# Patient Record
Sex: Male | Born: 1945 | Race: White | Hispanic: No | Marital: Married | Smoking: Former smoker
Health system: Southern US, Community
[De-identification: ages and names within clinical notes are randomized; demographics above are authoritative.]

## PROBLEM LIST (undated history)

## (undated) DIAGNOSIS — N200 Calculus of kidney: Secondary | ICD-10-CM

## (undated) DIAGNOSIS — M51369 Other intervertebral disc degeneration, lumbar region without mention of lumbar back pain or lower extremity pain: Secondary | ICD-10-CM

## (undated) DIAGNOSIS — C801 Malignant (primary) neoplasm, unspecified: Secondary | ICD-10-CM

## (undated) DIAGNOSIS — E119 Type 2 diabetes mellitus without complications: Secondary | ICD-10-CM

## (undated) DIAGNOSIS — R361 Hematospermia: Secondary | ICD-10-CM

## (undated) DIAGNOSIS — K649 Unspecified hemorrhoids: Secondary | ICD-10-CM

## (undated) DIAGNOSIS — M5136 Other intervertebral disc degeneration, lumbar region: Secondary | ICD-10-CM

## (undated) DIAGNOSIS — G63 Polyneuropathy in diseases classified elsewhere: Secondary | ICD-10-CM

## (undated) DIAGNOSIS — M1990 Unspecified osteoarthritis, unspecified site: Secondary | ICD-10-CM

## (undated) DIAGNOSIS — Z86010 Personal history of colonic polyps: Secondary | ICD-10-CM

## (undated) DIAGNOSIS — N529 Male erectile dysfunction, unspecified: Secondary | ICD-10-CM

## (undated) DIAGNOSIS — E669 Obesity, unspecified: Secondary | ICD-10-CM

## (undated) DIAGNOSIS — N400 Enlarged prostate without lower urinary tract symptoms: Secondary | ICD-10-CM

## (undated) DIAGNOSIS — E785 Hyperlipidemia, unspecified: Secondary | ICD-10-CM

## (undated) DIAGNOSIS — Z87442 Personal history of urinary calculi: Secondary | ICD-10-CM

## (undated) DIAGNOSIS — Z860101 Personal history of adenomatous and serrated colon polyps: Secondary | ICD-10-CM

## (undated) DIAGNOSIS — R7303 Prediabetes: Secondary | ICD-10-CM

## (undated) DIAGNOSIS — I10 Essential (primary) hypertension: Secondary | ICD-10-CM

## (undated) HISTORY — DX: Benign prostatic hyperplasia without lower urinary tract symptoms: N40.0

## (undated) HISTORY — PX: SMALL INTESTINE SURGERY: SHX150

## (undated) HISTORY — PX: HEMORRHOID SURGERY: SHX153

## (undated) HISTORY — DX: Calculus of kidney: N20.0

## (undated) HISTORY — DX: Prediabetes: R73.03

## (undated) HISTORY — DX: Hyperlipidemia, unspecified: E78.5

## (undated) HISTORY — DX: Male erectile dysfunction, unspecified: N52.9

## (undated) HISTORY — DX: Essential (primary) hypertension: I10

## (undated) HISTORY — DX: Hematospermia: R36.1

## (undated) HISTORY — DX: Obesity, unspecified: E66.9

## (undated) HISTORY — DX: Unspecified hemorrhoids: K64.9

## (undated) HISTORY — PX: BACK SURGERY: SHX140

## (undated) HISTORY — DX: Unspecified osteoarthritis, unspecified site: M19.90

---

## 2003-04-10 ENCOUNTER — Other Ambulatory Visit: Payer: Self-pay

## 2007-03-16 ENCOUNTER — Ambulatory Visit: Payer: Self-pay | Admitting: Gastroenterology

## 2009-11-01 ENCOUNTER — Ambulatory Visit: Payer: Self-pay

## 2012-05-09 ENCOUNTER — Ambulatory Visit: Payer: Self-pay | Admitting: Gastroenterology

## 2014-02-05 DIAGNOSIS — E782 Mixed hyperlipidemia: Secondary | ICD-10-CM

## 2014-02-05 DIAGNOSIS — R7303 Prediabetes: Secondary | ICD-10-CM | POA: Insufficient documentation

## 2014-02-05 DIAGNOSIS — E785 Hyperlipidemia, unspecified: Secondary | ICD-10-CM | POA: Insufficient documentation

## 2014-02-05 DIAGNOSIS — E1169 Type 2 diabetes mellitus with other specified complication: Secondary | ICD-10-CM | POA: Insufficient documentation

## 2014-02-05 DIAGNOSIS — E1149 Type 2 diabetes mellitus with other diabetic neurological complication: Secondary | ICD-10-CM | POA: Insufficient documentation

## 2014-08-28 ENCOUNTER — Other Ambulatory Visit: Payer: Self-pay | Admitting: Urology

## 2014-08-28 DIAGNOSIS — N138 Other obstructive and reflux uropathy: Secondary | ICD-10-CM

## 2014-08-28 DIAGNOSIS — N401 Enlarged prostate with lower urinary tract symptoms: Principal | ICD-10-CM

## 2014-08-29 ENCOUNTER — Ambulatory Visit (INDEPENDENT_AMBULATORY_CARE_PROVIDER_SITE_OTHER): Payer: Medicare Other | Admitting: Urology

## 2014-08-29 ENCOUNTER — Encounter: Payer: Self-pay | Admitting: Urology

## 2014-08-29 VITALS — BP 131/74 | HR 91 | Ht 71.0 in | Wt 242.1 lb

## 2014-08-29 DIAGNOSIS — N401 Enlarged prostate with lower urinary tract symptoms: Secondary | ICD-10-CM

## 2014-08-29 DIAGNOSIS — N138 Other obstructive and reflux uropathy: Secondary | ICD-10-CM

## 2014-08-29 DIAGNOSIS — R361 Hematospermia: Secondary | ICD-10-CM

## 2014-08-29 LAB — BLADDER SCAN AMB NON-IMAGING: Scan Result: 30

## 2014-08-29 MED ORDER — TAMSULOSIN HCL 0.4 MG PO CAPS: 0.4000 mg | ORAL_CAPSULE | Freq: Every day | ORAL | Status: DC

## 2014-08-29 NOTE — Progress Notes (Signed)
08/29/2014 9:43 PM   Fred Anderson Feb 26, 1946 [id number]  Referring provider: No referring provider defined for this encounter.  Chief Complaint  Patient presents with  . Hematospermia    3 month follow up patients states health has gone down hill since starting med you gave him    HPI: [name] is a 69 year old white male who presented to [place] 3 months ago for post void dribbling. I started him on finasteride 5 mg daily and instructed him to return to the clinic in 3 months. Patient stated that since he started the finasteride he has been going downhill. He started experiencing extreme fatigue and stopped the finasteride. The fatigue improved, so he restarted the finasteride and the fatigue returned. He did state it helped the post void dribbling, but he cannot tolerate the medication.  His IPSS score 3 months ago was 2/0 (mild).  His IPSS score today is 1/1(mild).  He is still experiencing points void dribbling which is very bothersome to him. His PSA was 3.6 ng/mL on 05/22/2014.      IPSS      08/29/14 1000       International Prostate Symptom Score   How often have you had the sensation of not emptying your bladder? Not at All     How often have you had to urinate less than every two hours? Not at All     How often have you found you stopped and started again several times when you urinated? Not at All     How often have you found it difficult to postpone urination? Not at All     How often have you had a weak urinary stream? Less than 1 in 5 times     How often have you had to strain to start urination? Not at All     How many times did you typically get up at night to urinate? None     Total IPSS Score 1     Quality of Life due to urinary symptoms   If you were to spend the rest of your life with your urinary condition just the way it is now how would you feel about that? Pleased        Patient does not report any episodes of hematospermia over the last 3  months.    PMH: Past Medical History  Diagnosis Date  . Prediabetes   . Hyperlipidemia   . Hemorrhoids   . Blood in semen   . Kidney stones   . Arthritis   . Hypertension   . Obesity   . Hematospermia   . Erectile dysfunction   . BPH (benign prostatic hypertrophy)     Surgical History: No past surgical history on file.  Home Medications:    Medication List       This list is accurate as of: 08/29/14 11:59 PM.  Always use your most recent med list.               aspirin 325 MG tablet  Take 325 mg by mouth daily.     fenofibrate 160 MG tablet  Take 160 mg by mouth daily.     multivitamin tablet  Take 1 tablet by mouth daily.     naproxen 500 MG tablet  Commonly known as:  NAPROSYN  Take 500 mg by mouth 2 (two) times daily with a meal.     niacin 500 MG tablet  Take 500 mg by mouth at bedtime.  OMEGA 3 PO  Take by mouth.     tamsulosin 0.4 MG Caps capsule  Commonly known as:  FLOMAX  Take 1 capsule (0.4 mg total) by mouth daily.        Allergies: No Known Allergies  Family History: No family history on file.  Social History:  reports that he quit smoking about 26 years ago. He does not have any smokeless tobacco history on file. He reports that he drinks about 7.2 oz of alcohol per week. He reports that he does not use illicit drugs.  ROS: Urological Symptom Review  Patient is experiencing the following symptoms: Leakage of urine Erection problems (male only)   Review of Systems  Gastrointestinal (upper)  : Negative for upper GI symptoms  Gastrointestinal (lower) : Negative for lower GI symptoms  Constitutional : Negative for symptoms  Skin: Negative for skin symptoms  Eyes: Negative for eye symptoms  Ear/Nose/Throat : Negative for Ear/Nose/Throat symptoms  Hematologic/Lymphatic: Negative for Hematologic/Lymphatic symptoms  Cardiovascular : Negative for cardiovascular symptoms  Respiratory : Negative for respiratory  symptoms  Endocrine: Negative for endocrine symptoms  Musculoskeletal: Negative for musculoskeletal symptoms  Neurological: Negative for neurological symptoms  Psychologic: Negative for psychiatric symptoms   Physical Exam: BP 131/74 mmHg  Pulse 91  Ht '5\' 11"'$  (1.803 m)  Wt 242 lb 1.6 oz (109.816 kg)  BMI 33.78 kg/m2  Results for orders placed or performed in visit on 08/29/14  BLADDER SCAN AMB NON-IMAGING  Result Value Ref Range   Scan Result 30    Laboratory Data: No results found for: WBC, HGB, HCT, MCV, PLT  No results found for: CREATININE  No results found for: PSA  No results found for: TESTOSTERONE  No results found for: HGBA1C  Urinalysis No results found for: COLORURINE, APPEARANCEUR, LABSPEC, PHURINE, GLUCOSEU, HGBUR, BILIRUBINUR, Derrill Memo, UROBILINOGEN, NITRITE, LEUKOCYTESUR  Pertinent Imaging: Results for FRANKI, STEMEN (MRN [id number]) as of 09/01/2014 23:22  Ref. Range 08/29/2014 09:48  Scan Result Unknown 30   Assessment & Plan:    1. BPH with obstruction/lower urinary tract symptoms:  Patient was started on tamsulosin 0.4 mg 1 capsule daily 30 minutes after a meal.   I advised him of the side effects, such as: retrograde ejaculation, sinus congestion, nasal congestion, rhinorrhea, rhinitis, dizziness, and seasonal allergic rhinitis.  He will follow-up in 3 months time for IPSS score and symptom recheck. PSA History:    3.6 ng/mL on 05/22/2014.     - BLADDER SCAN AMB NON-IMAGING  2. Hematospermia:  Patient is advised he may experience in his semen now that he has discontinued the finasteride. He also may not see any semen due to the side effects of the tamsulosin.  He is reassured that this is not a significant finding.  No Follow-up on file.  Zara Council, [facility] [address], [place], [place] [address] [phone]

## 2014-09-02 ENCOUNTER — Encounter: Payer: Self-pay | Admitting: Urology

## 2014-09-02 DIAGNOSIS — N138 Other obstructive and reflux uropathy: Secondary | ICD-10-CM | POA: Insufficient documentation

## 2014-09-02 DIAGNOSIS — N401 Enlarged prostate with lower urinary tract symptoms: Principal | ICD-10-CM

## 2014-09-02 DIAGNOSIS — R361 Hematospermia: Secondary | ICD-10-CM | POA: Insufficient documentation

## 2014-10-16 ENCOUNTER — Emergency Department (HOSPITAL_COMMUNITY): Payer: Medicare Other

## 2014-10-16 ENCOUNTER — Encounter (HOSPITAL_COMMUNITY): Payer: Self-pay | Admitting: Neurology

## 2014-10-16 ENCOUNTER — Emergency Department (HOSPITAL_COMMUNITY)
Admission: EM | Admit: 2014-10-16 | Discharge: 2014-10-16 | Disposition: A | Discharge status: Home / Self Care | Payer: Medicare Other | Attending: Emergency Medicine | Admitting: Emergency Medicine

## 2014-10-16 DIAGNOSIS — Z8739 Personal history of other diseases of the musculoskeletal system and connective tissue: Secondary | ICD-10-CM | POA: Diagnosis not present

## 2014-10-16 DIAGNOSIS — I10 Essential (primary) hypertension: Secondary | ICD-10-CM | POA: Diagnosis not present

## 2014-10-16 DIAGNOSIS — T148XXA Other injury of unspecified body region, initial encounter: Secondary | ICD-10-CM

## 2014-10-16 DIAGNOSIS — Z23 Encounter for immunization: Secondary | ICD-10-CM | POA: Diagnosis not present

## 2014-10-16 DIAGNOSIS — E669 Obesity, unspecified: Secondary | ICD-10-CM | POA: Diagnosis not present

## 2014-10-16 DIAGNOSIS — T148 Other injury of unspecified body region: Secondary | ICD-10-CM | POA: Insufficient documentation

## 2014-10-16 DIAGNOSIS — Z87442 Personal history of urinary calculi: Secondary | ICD-10-CM | POA: Diagnosis not present

## 2014-10-16 DIAGNOSIS — T1490XA Injury, unspecified, initial encounter: Secondary | ICD-10-CM

## 2014-10-16 DIAGNOSIS — Y9389 Activity, other specified: Secondary | ICD-10-CM | POA: Diagnosis not present

## 2014-10-16 DIAGNOSIS — Y92410 Unspecified street and highway as the place of occurrence of the external cause: Secondary | ICD-10-CM | POA: Insufficient documentation

## 2014-10-16 DIAGNOSIS — Z87891 Personal history of nicotine dependence: Secondary | ICD-10-CM | POA: Insufficient documentation

## 2014-10-16 DIAGNOSIS — Y998 Other external cause status: Secondary | ICD-10-CM | POA: Diagnosis not present

## 2014-10-16 DIAGNOSIS — T07XXXA Unspecified multiple injuries, initial encounter: Secondary | ICD-10-CM

## 2014-10-16 LAB — SAMPLE TO BLOOD BANK

## 2014-10-16 LAB — CBC
HCT: 48.2 % (ref 39.0–52.0)
HEMOGLOBIN: 16.6 g/dL (ref 13.0–17.0)
MCH: 31.8 pg (ref 26.0–34.0)
MCHC: 34.4 g/dL (ref 30.0–36.0)
MCV: 92.3 fL (ref 78.0–100.0)
PLATELETS: 166 10*3/uL (ref 150–400)
RBC: 5.22 MIL/uL (ref 4.22–5.81)
RDW: 14.2 % (ref 11.5–15.5)
WBC: 5.6 10*3/uL (ref 4.0–10.5)

## 2014-10-16 LAB — I-STAT CHEM 8, ED
BUN: 22 mg/dL — ABNORMAL HIGH (ref 6–20)
CALCIUM ION: 1.31 mmol/L — AB (ref 1.13–1.30)
CHLORIDE: 108 mmol/L (ref 101–111)
CREATININE: 1 mg/dL (ref 0.61–1.24)
GLUCOSE: 110 mg/dL — AB (ref 65–99)
HCT: 52 % (ref 39.0–52.0)
Hemoglobin: 17.7 g/dL — ABNORMAL HIGH (ref 13.0–17.0)
Potassium: 4.2 mmol/L (ref 3.5–5.1)
Sodium: 142 mmol/L (ref 135–145)
TCO2: 21 mmol/L (ref 0–100)

## 2014-10-16 LAB — COMPREHENSIVE METABOLIC PANEL
ALBUMIN: 4.2 g/dL (ref 3.5–5.0)
ALK PHOS: 38 U/L (ref 38–126)
ALT: 17 U/L (ref 17–63)
ANION GAP: 11 (ref 5–15)
AST: 24 U/L (ref 15–41)
BUN: 19 mg/dL (ref 6–20)
CALCIUM: 10.3 mg/dL (ref 8.9–10.3)
CHLORIDE: 108 mmol/L (ref 101–111)
CO2: 20 mmol/L — AB (ref 22–32)
Creatinine, Ser: 1.03 mg/dL (ref 0.61–1.24)
GFR calc non Af Amer: >60 mL/min (ref >60)
Glucose, Bld: 105 mg/dL — ABNORMAL HIGH (ref 65–99)
Potassium: 4.2 mmol/L (ref 3.5–5.1)
SODIUM: 139 mmol/L (ref 135–145)
Total Bilirubin: 0.8 mg/dL (ref 0.3–1.2)
Total Protein: 6.9 g/dL (ref 6.5–8.1)

## 2014-10-16 LAB — PROTIME-INR
INR: 1.03 (ref 0.00–1.49)
PROTHROMBIN TIME: 13.7 s (ref 11.6–15.2)
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
## 2014-10-16 MED ORDER — HYDROCODONE-ACETAMINOPHEN 5-325 MG PO TABS
1.0000 | ORAL_TABLET | Freq: Once | ORAL | Status: AC
  Administered 2014-10-16: 1 via ORAL
  Filled 2014-10-16: qty 1

## 2014-10-16 MED ORDER — TETANUS-DIPHTH-ACELL PERTUSSIS 5-2.5-18.5 LF-MCG/0.5 IM SUSP
0.5000 mL | Freq: Once | INTRAMUSCULAR | Status: AC
  Administered 2014-10-16: 0.5 mL via INTRAMUSCULAR
  Filled 2014-10-16: qty 0.5

## 2014-10-16 MED ORDER — OXYCODONE-ACETAMINOPHEN 5-325 MG PO TABS: 1.0000 | ORAL_TABLET | Freq: Four times a day (QID) | ORAL | Status: DC | PRN

## 2014-10-16 MED ORDER — IOHEXOL 300 MG/ML  SOLN
100.0000 mL | Freq: Once | INTRAMUSCULAR | Status: AC | PRN
  Administered 2014-10-16: 100 mL via INTRAVENOUS

## 2014-10-16 NOTE — ED Notes (Signed)
Patient transported to X-ray 
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
## 2014-10-16 NOTE — Discharge Instructions (Signed)
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
## 2014-10-17 LAB — CDS SEROLOGY

## 2014-11-29 ENCOUNTER — Ambulatory Visit (INDEPENDENT_AMBULATORY_CARE_PROVIDER_SITE_OTHER): Payer: Medicare Other | Admitting: Urology

## 2014-11-29 ENCOUNTER — Encounter: Payer: Self-pay | Admitting: Urology

## 2014-11-29 VITALS — BP 133/75 | HR 70 | Ht 71.0 in | Wt 249.5 lb

## 2014-11-29 DIAGNOSIS — N529 Male erectile dysfunction, unspecified: Secondary | ICD-10-CM

## 2014-11-29 DIAGNOSIS — N528 Other male erectile dysfunction: Secondary | ICD-10-CM

## 2014-11-29 DIAGNOSIS — N138 Other obstructive and reflux uropathy: Secondary | ICD-10-CM

## 2014-11-29 DIAGNOSIS — N401 Enlarged prostate with lower urinary tract symptoms: Secondary | ICD-10-CM | POA: Diagnosis not present

## 2014-11-29 NOTE — Progress Notes (Signed)
11/29/2014 9:05 AM   Fred Anderson 10/15/1945 [id number]  Referring provider: [name], MD [place], [place] [address]  Chief Complaint  Patient presents with  . Benign Prostatic Hypertrophy    3 month follow up    HPI: Patient is a 69 year old white male who was placed on finasteride for his symptoms of hematospermia and BPH and LUTS and found the side effects intolerable. He was then changed to tamsulosin 0.4 mg daily and he presents today to discuss his response.  His IPSS score today is 5 , which is mild lower urinary tract symptomatology. He is mostly satisfied with his quality life due to his urinary symptoms.   His major complaint today nocturia that started just last week, but he attributes this to an increase in his water intake and attempt to lose weight.  He denies any dysuria, hematuria or suprapubic pain.   He also denies any recent fevers, chills, nausea or vomiting.  He does not have a family history of PCa.      IPSS      11/29/14 0800       International Prostate Symptom Score   How often have you had the sensation of not emptying your bladder? Less than 1 in 5     How often have you had to urinate less than every two hours? Not at All     How often have you found you stopped and started again several times when you urinated? Not at All     How often have you found it difficult to postpone urination? Not at All     How often have you had a weak urinary stream? About half the time     How often have you had to strain to start urination? Not at All     How many times did you typically get up at night to urinate? 1 Time     Total IPSS Score 5     Quality of Life due to urinary symptoms   If you were to spend the rest of your life with your urinary condition just the way it is now how would you feel about that? Mostly Satisfied        Score:  1-7 Mild 8-19 Moderate 20-35 Severe     PMH: Past Medical History  Diagnosis Date  .  Prediabetes   . Hyperlipidemia   . Hemorrhoids   . Blood in semen   . Kidney stones   . Arthritis   . Hypertension   . Obesity   . Hematospermia   . Erectile dysfunction   . BPH (benign prostatic hypertrophy)     Surgical History: Past Surgical History  Procedure Laterality Date  . Small intestine surgery    . Back surgery  age 69    Broken back    Home Medications:    Medication List       This list is accurate as of: 11/29/14  9:05 AM.  Always use your most recent med list.               aspirin 325 MG tablet  Take 325 mg by mouth daily.     fenofibrate 160 MG tablet  Take 160 mg by mouth daily.     multivitamin tablet  Take 1 tablet by mouth daily.     naproxen 500 MG tablet  Commonly known as:  NAPROSYN  Take 500 mg by mouth 2 (two) times daily with a  meal.     niacin 500 MG tablet  Take 500 mg by mouth at bedtime.     OMEGA 3 PO  Take by mouth.     oxyCODONE-acetaminophen 5-325 MG per tablet  Commonly known as:  PERCOCET/ROXICET  Take 1 tablet by mouth every 6 (six) hours as needed for severe pain.     tamsulosin 0.4 MG Caps capsule  Commonly known as:  FLOMAX  Take 1 capsule (0.4 mg total) by mouth daily.        Allergies: No Known Allergies  Family History: Family History  Problem Relation Age of Onset  . Colon cancer Father   . Cancer Mother   . Kidney disease Neg Hx   . Prostate cancer Neg Hx     Social History:  reports that he quit smoking about 26 years ago. He does not have any smokeless tobacco history on file. He reports that he drinks about 7.2 oz of alcohol per week. He reports that he does not use illicit drugs.  ROS: UROLOGY Frequent Urination?: No Hard to postpone urination?: No Burning/pain with urination?: No Get up at night to urinate?: No Leakage of urine?: Yes Urine stream starts and stops?: No Trouble starting stream?: No Do you have to strain to urinate?: No Blood in urine?: No Urinary tract infection?:  No Sexually transmitted disease?: No Injury to kidneys or bladder?: No Painful intercourse?: No Weak stream?: Yes Erection problems?: No Penile pain?: No  Gastrointestinal Nausea?: No Vomiting?: No Indigestion/heartburn?: No Diarrhea?: No Constipation?: No  Constitutional Fever: No Night sweats?: No Weight loss?: No Fatigue?: No  Skin Skin rash/lesions?: No Itching?: No  Eyes Blurred vision?: No Double vision?: No  Ears/Nose/Throat Sore throat?: No Sinus problems?: No  Hematologic/Lymphatic Swollen glands?: No Easy bruising?: No  Cardiovascular Leg swelling?: No Chest pain?: No  Respiratory Cough?: No Shortness of breath?: No  Endocrine Excessive thirst?: No  Musculoskeletal Back pain?: No Joint pain?: No  Neurological Headaches?: No Dizziness?: No  Psychologic Depression?: No Anxiety?: No  Physical Exam: BP 133/75 mmHg  Pulse 70  Ht '5\' 11"'$  (1.803 m)  Wt 249 lb 8 oz (113.172 kg)  BMI 34.81 kg/m2   Laboratory Data: Lab Results  Component Value Date   WBC 5.6 10/16/2014   HGB 17.7* 10/16/2014   HCT 52.0 10/16/2014   MCV 92.3 10/16/2014   PLT 166 10/16/2014    Lab Results  Component Value Date   CREATININE 1.00 10/16/2014   PSA History:  3.6 ng/mL on 05/22/2014.   Assessment & Plan:    1. BPH with LUTS:   IPSS is 5/2.  He is finding that the tamsulosin is effective for his LUTS.  He will continue the medication.  He will RTC in one year for PSA, DRE and IPSS score.    2. Erectile dysfunction:   We discussed trying a different PDE5 inhibitor, intra-urethral suppositories, intracavernous vasoactive drug injection therapy, vacuum constriction device and penile prosthesis implantation.   He was not interested in any of these treatment modalities. He also asked about testosterone therapy. I stated to the patient that testosterone therapy is not indicated for erectile dysfunction treatment.  He is not having other symptoms of  hypogonadism at this time so screening is not warranted.  I also explained to him the side effects of testosterone therapy and because of that we did not use it to treat solely erectile dysfunction.    There are no diagnoses linked to this encounter.  Return in  about 1 year (around 11/29/2015) for IPSS.  Zara Council, [facility] [address] [place], [place] [address] [phone]

## 2015-09-05 DIAGNOSIS — E1142 Type 2 diabetes mellitus with diabetic polyneuropathy: Secondary | ICD-10-CM | POA: Insufficient documentation

## 2015-09-27 ENCOUNTER — Other Ambulatory Visit: Payer: Self-pay

## 2015-09-27 DIAGNOSIS — N400 Enlarged prostate without lower urinary tract symptoms: Secondary | ICD-10-CM

## 2015-09-27 MED ORDER — TAMSULOSIN HCL 0.4 MG PO CAPS: 0.4000 mg | ORAL_CAPSULE | Freq: Every day | ORAL | Status: DC

## 2015-12-02 ENCOUNTER — Encounter: Payer: Self-pay | Admitting: Urology

## 2015-12-02 ENCOUNTER — Ambulatory Visit (INDEPENDENT_AMBULATORY_CARE_PROVIDER_SITE_OTHER): Payer: Medicare Other | Admitting: Urology

## 2015-12-02 VITALS — BP 128/80 | HR 72 | Ht 71.0 in | Wt 255.4 lb

## 2015-12-02 DIAGNOSIS — N528 Other male erectile dysfunction: Secondary | ICD-10-CM

## 2015-12-02 DIAGNOSIS — N138 Other obstructive and reflux uropathy: Secondary | ICD-10-CM

## 2015-12-02 DIAGNOSIS — N401 Enlarged prostate with lower urinary tract symptoms: Secondary | ICD-10-CM | POA: Diagnosis not present

## 2015-12-02 DIAGNOSIS — N529 Male erectile dysfunction, unspecified: Secondary | ICD-10-CM

## 2015-12-02 NOTE — Progress Notes (Signed)
12/02/2015 8:59 AM   Fred Anderson 1945-11-22 [id number]  Referring provider: [name], MD [name], [place] [address]  Chief Complaint  Patient presents with  . Benign Prostatic Hypertrophy    1 Year follow up   . Erectile Dysfunction    HPI: Patient is a 70 year old Caucasian male who presents today for a one year follow up for ED and BPH with LUTS.    BPH WITH LUTS His IPSS score today is 7, which is mild lower urinary tract symptomatology. He is delighted with his quality life due to his urinary symptoms.  His previous IPSS score was 5/2.  His previous PVR is 30 mL.  His major complaint today is frequency of urination at times and a weak urinary stream in the morning.  He has had these symptoms for several years.  He denies any dysuria, hematuria or suprapubic pain.   He currently taking tamsulosin 0.4 mg daily.  He also denies any recent fevers, chills, nausea or vomiting.  He does not have a family history of PCa.      IPSS    Row Name 12/02/15 0800         International Prostate Symptom Score   How often have you had the sensation of not emptying your bladder? Less than 1 in 5     How often have you had to urinate less than every two hours? Less than half the time     How often have you found you stopped and started again several times when you urinated? Not at All     How often have you found it difficult to postpone urination? Less than half the time     How often have you had a weak urinary stream? Less than 1 in 5 times     How often have you had to strain to start urination? Not at All     How many times did you typically get up at night to urinate? 1 Time     Total IPSS Score 7       Quality of Life due to urinary symptoms   If you were to spend the rest of your life with your urinary condition just the way it is now how would you feel about that? Delighted        Score:  1-7 Mild 8-19 Moderate 20-35 Severe  Erectile  dysfunction His SHIM score is 10, which is moderate ED.   He has been having difficulty with erections for several years.   His major complaint is lack of firmness in his erections, so they not satisfactory for intercourse.  His libido is preserved.   His risk factors for ED are age, BPH, DM, HTN, HLD, sleep apnea, alcohol abuse, pain medication and blood pressure medications.  He denies any painful erections or curvatures with his erections.   He has tried PDE5-inhibitors in the past and they were not effective.       SHIM    Row Name 12/02/15 0836         SHIM: Over the last 6 months:   How do you rate your confidence that you could get and keep an erection? Very Low     When you had erections with sexual stimulation, how often were your erections hard enough for penetration (entering your partner)? A Few Times (much less than half the time)     During sexual intercourse, how often were you able to maintain your erection  after you had penetrated (entered) your partner? Very Difficult     During sexual intercourse, how difficult was it to maintain your erection to completion of intercourse? Difficult     When you attempted sexual intercourse, how often was it satisfactory for you? Very Difficult       SHIM Total Score   SHIM 10        Score: 1-7 Severe ED 8-11 Moderate ED 12-16 Mild-Moderate ED 17-21 Mild ED 22-25 No ED  PMH: Past Medical History:  Diagnosis Date  . Arthritis   . Blood in semen   . BPH (benign prostatic hypertrophy)   . Erectile dysfunction   . Hematospermia   . Hemorrhoids   . Hyperlipidemia   . Hypertension   . Kidney stones   . Obesity   . Prediabetes     Surgical History: Past Surgical History:  Procedure Laterality Date  . BACK SURGERY  age 72   Broken back  . SMALL INTESTINE SURGERY      Home Medications:    Medication List       Accurate as of 12/02/15  8:59 AM. Always use your most recent med list.          aspirin 325 MG  tablet Take 325 mg by mouth daily.   fenofibrate 160 MG tablet Take 160 mg by mouth daily.   finasteride 5 MG tablet Commonly known as:  PROSCAR Take by mouth.   gabapentin 300 MG capsule Commonly known as:  NEURONTIN Take by mouth.   lovastatin 20 MG tablet Commonly known as:  MEVACOR TAKE 1 TABLET (20 MG TOTAL) BY MOUTH DAILY WITH DINNER.   MULTIVITAMIN ADULT EXTRA C PO Take by mouth.   multivitamin tablet Take 1 tablet by mouth daily.   naproxen 500 MG tablet Commonly known as:  NAPROSYN Take 500 mg by mouth 2 (two) times daily with a meal.   niacin 500 MG tablet Take 500 mg by mouth at bedtime.   OMEGA 3 PO Take by mouth.   oxyCODONE-acetaminophen 5-325 MG tablet Commonly known as:  PERCOCET/ROXICET Take 1 tablet by mouth every 6 (six) hours as needed for severe pain.   tamsulosin 0.4 MG Caps capsule Commonly known as:  FLOMAX Take 1 capsule (0.4 mg total) by mouth daily.       Allergies: No Known Allergies  Family History: Family History  Problem Relation Age of Onset  . Colon cancer Father   . Cancer Mother   . Kidney disease Neg Hx   . Prostate cancer Neg Hx     Social History:  reports that he quit smoking about 27 years ago. He has never used smokeless tobacco. He reports that he drinks about 7.2 oz of alcohol per week . He reports that he does not use drugs.  ROS: UROLOGY Frequent Urination?: Yes Hard to postpone urination?: No Burning/pain with urination?: No Get up at night to urinate?: No Leakage of urine?: No Urine stream starts and stops?: No Trouble starting stream?: No Do you have to strain to urinate?: No Blood in urine?: No Urinary tract infection?: No Sexually transmitted disease?: No Injury to kidneys or bladder?: No Painful intercourse?: No Weak stream?: Yes Erection problems?: No Penile pain?: No  Gastrointestinal Nausea?: No Vomiting?: No Indigestion/heartburn?: No Diarrhea?: No Constipation?:  No  Constitutional Fever: No Night sweats?: Yes Weight loss?: No Fatigue?: No  Skin Skin rash/lesions?: No Itching?: No  Eyes Blurred vision?: No Double vision?: No  Ears/Nose/Throat Sore throat?:  No Sinus problems?: No  Hematologic/Lymphatic Swollen glands?: No Easy bruising?: No  Cardiovascular Leg swelling?: No Chest pain?: No  Respiratory Cough?: No Shortness of breath?: No  Endocrine Excessive thirst?: No  Musculoskeletal Back pain?: No Joint pain?: No  Neurological Headaches?: No Dizziness?: No  Psychologic Depression?: No Anxiety?: No  Physical Exam: BP 128/80   Pulse 72   Ht '5\' 11"'$  (1.803 m)   Wt 255 lb 6.4 oz (115.8 kg)   BMI 35.62 kg/m   Constitutional: Well nourished. Alert and oriented, No acute distress. HEENT: [place] AT, moist mucus membranes. Trachea midline, no masses. Cardiovascular: No clubbing, cyanosis, or edema. Respiratory: Normal respiratory effort, no increased work of breathing. GI: Abdomen is soft, non tender, non distended, no abdominal masses. Liver and spleen not palpable.  No hernias appreciated.  Stool sample for occult testing is not indicated.   GU: No CVA tenderness.  No bladder fullness or masses.  Patient with uncircumcised phallus. Foreskin easily retracted  Urethral meatus is patent.  No penile discharge. No penile lesions or rashes. Scrotum without lesions, cysts, rashes and/or edema.  Testicles are located scrotally bilaterally. No masses are appreciated in the testicles. Left and right epididymis are normal. Rectal: Patient with  normal sphincter tone. Anus and perineum without scarring or rashes. No rectal masses are appreciated. Prostate is approximately 55 grams, no nodules are appreciated. Seminal vesicles are normal. Skin: No rashes, bruises or suspicious lesions. Lymph: No cervical or inguinal adenopathy. Neurologic: Grossly intact, no focal deficits, moving all 4 extremities. Psychiatric: Normal mood and  affect.  Laboratory Data: Lab Results  Component Value Date   WBC 5.6 10/16/2014   HGB 17.7 (H) 10/16/2014   HCT 52.0 10/16/2014   MCV 92.3 10/16/2014   PLT 166 10/16/2014    Lab Results  Component Value Date   CREATININE 1.00 10/16/2014   PSA History:  3.6 ng/mL on 05/22/2014   Assessment & Plan:    1. BPH with LUTS  - IPSS score is 7/0, it is stable  - Continue conservative management, avoiding bladder irritants and timed voiding's  - Continue tamsulosin 0.4 mg daily, refill not needed today  - Was on finasteride but could not tolerate the side effects  - RTC in 12 months for IPSS, PSA and exam    2. Erectile dysfunction:   - SHIM score 10  - failed PDE5-inhibitors  - discussed Trimix injections, might be interested due to his brother having success with the injection, would like to discuss with wife before pursuing  - encouraged the patient to have a sleep study, as this can be a risk factor for ED, he will speak with his PCP  Return in about 1 year (around 12/01/2016) for IPSS, SHIM, PSA and exam.  Zara Council, [facility]  [facility] [address] [place], [place] [address] [phone]

## 2015-12-03 LAB — PSA: Prostate Specific Ag, Serum: 2.6 ng/mL (ref 0.0–4.0)

## 2015-12-04 ENCOUNTER — Telehealth: Payer: Self-pay

## 2015-12-04 NOTE — Telephone Encounter (Signed)
LMOM

## 2015-12-04 NOTE — Telephone Encounter (Signed)
-----   Message from Nori Riis, PA-C sent at 12/03/2015  8:55 PM EDT ----- PSA is good.  We will see him in one year.

## 2015-12-05 NOTE — Telephone Encounter (Signed)
Spoke with pt in reference to PSA results. Pt voiced understanding.  

## 2016-09-14 DIAGNOSIS — Z79899 Other long term (current) drug therapy: Secondary | ICD-10-CM | POA: Insufficient documentation

## 2016-09-18 DIAGNOSIS — Z86010 Personal history of colonic polyps: Secondary | ICD-10-CM | POA: Insufficient documentation

## 2016-10-18 ENCOUNTER — Other Ambulatory Visit: Payer: Self-pay | Admitting: Urology

## 2016-10-18 DIAGNOSIS — N400 Enlarged prostate without lower urinary tract symptoms: Secondary | ICD-10-CM

## 2016-11-22 ENCOUNTER — Other Ambulatory Visit: Payer: Self-pay | Admitting: Urology

## 2016-11-22 DIAGNOSIS — N400 Enlarged prostate without lower urinary tract symptoms: Secondary | ICD-10-CM

## 2016-11-24 ENCOUNTER — Other Ambulatory Visit: Payer: Medicare Other

## 2016-11-24 ENCOUNTER — Encounter: Payer: Self-pay | Admitting: Urology

## 2016-11-30 NOTE — Progress Notes (Signed)
12/01/2016 8:49 AM   Fred Anderson 09-27-1945 [id number]  Referring provider: [name], MD No address on file  Chief Complaint  Patient presents with  . Benign Prostatic Hypertrophy    1 year follow up  . Erectile Dysfunction    HPI: Patient is a 71 year old Caucasian male who presents today for a one year follow up for ED and BPH with LUTS.    BPH WITH LUTS His IPSS score today is 6, which is mild lower urinary tract symptomatology. He is mostly satisfied with his quality life due to his urinary symptoms.  His previous IPSS score was 7/0.  His previous PVR is 30 mL.  His major complaint today is nocturia.   He has had these symptoms for several years.  He denies any dysuria, hematuria or suprapubic pain.   He currently taking tamsulosin 0.4 mg daily.  He also denies any recent fevers, chills, nausea or vomiting.  He does not have a family history of PCa.      IPSS    Row Name 12/01/16 0800         International Prostate Symptom Score   How often have you had the sensation of not emptying your bladder? Less than 1 in 5     How often have you had to urinate less than every two hours? Less than 1 in 5 times     How often have you found you stopped and started again several times when you urinated? Less than 1 in 5 times     How often have you found it difficult to postpone urination? Less than 1 in 5 times     How often have you had a weak urinary stream? Less than 1 in 5 times     How often have you had to strain to start urination? Not at All     How many times did you typically get up at night to urinate? 1 Time     Total IPSS Score 6       Quality of Life due to urinary symptoms   If you were to spend the rest of your life with your urinary condition just the way it is now how would you feel about that? Mostly Satisfied        Score:  1-7 Mild 8-19 Moderate 20-35 Severe  Erectile dysfunction His SHIM score is 13, which is mild to moderate ED.   He has  been having difficulty with erections for several years.   His previous SHIM score was 10.  His major complaint is lack of firmness in his erections, so they not satisfactory for intercourse.  His libido is preserved.   His risk factors for ED are age, BPH, DM, HTN, HLD, sleep apnea, alcohol abuse, pain medication and blood pressure medications.  He denies any painful erections or curvatures with his erections.   He has tried PDE5-inhibitors in the past and they were not effective.       [name] Name 12/01/16 [phone]         SHIM: Over the last 6 months:   How do you rate your confidence that you could get and keep an erection? Very Low     When you had erections with sexual stimulation, how often were your erections hard enough for penetration (entering your partner)? Sometimes (about half the time)     During sexual intercourse, how often were you able to maintain your erection after you had  penetrated (entered) your partner? Most Times (much more than half the time)     During sexual intercourse, how difficult was it to maintain your erection to completion of intercourse? Slightly Difficult     When you attempted sexual intercourse, how often was it satisfactory for you? Almost Never or Never       SHIM Total Score   SHIM 13        Score: 1-7 Severe ED 8-11 Moderate ED 12-16 Mild-Moderate ED 17-21 Mild ED 22-25 No ED  PMH: Past Medical History:  Diagnosis Date  . Arthritis   . Blood in semen   . BPH (benign prostatic hypertrophy)   . Erectile dysfunction   . Hematospermia   . Hemorrhoids   . Hyperlipidemia   . Hypertension   . Kidney stones   . Obesity   . Prediabetes     Surgical History: Past Surgical History:  Procedure Laterality Date  . BACK SURGERY  age 11   Broken back  . SMALL INTESTINE SURGERY      Home Medications:  Allergies as of 12/01/2016      Reactions   Atorvastatin Other (See Comments)      Medication List       Accurate as of 12/01/16   8:49 AM. Always use your most recent med list.          aspirin 325 MG tablet Take 325 mg by mouth daily.   fenofibrate 160 MG tablet Take 160 mg by mouth daily.   finasteride 5 MG tablet Commonly known as:  PROSCAR Take by mouth.   gabapentin 300 MG capsule Commonly known as:  NEURONTIN Take by mouth.   gabapentin 300 MG capsule Commonly known as:  NEURONTIN Take by mouth.   lovastatin 20 MG tablet Commonly known as:  MEVACOR TAKE 1 TABLET (20 MG TOTAL) BY MOUTH DAILY WITH DINNER.   metFORMIN 500 MG tablet Commonly known as:  GLUCOPHAGE Take by mouth.   MULTIVITAMIN ADULT EXTRA C PO Take by mouth.   multivitamin tablet Take 1 tablet by mouth daily.   MULTIVITAMIN/IRON PO Take by mouth.   naproxen 500 MG tablet Commonly known as:  NAPROSYN Take 500 mg by mouth 2 (two) times daily with a meal.   naproxen sodium 220 MG tablet Commonly known as:  ANAPROX Take by mouth.   niacin 500 MG tablet Take 500 mg by mouth at bedtime.   OMEGA 3 PO Take by mouth.   Fish Oil 500 MG Caps Take by mouth.   oxyCODONE-acetaminophen 5-325 MG tablet Commonly known as:  PERCOCET/ROXICET Take 1 tablet by mouth every 6 (six) hours as needed for severe pain.   tamsulosin 0.4 MG Caps capsule Commonly known as:  FLOMAX Take 1 capsule (0.4 mg total) by mouth daily.            Discharge Care Instructions        Start     Ordered   12/01/16 0000  tamsulosin (FLOMAX) 0.4 MG CAPS capsule  Daily    Question:  Supervising Provider  Answer:  Hollice Espy   12/01/16 0842   11/24/16 0000  PSA     11/24/16 1287      Allergies:  Allergies  Allergen Reactions  . Atorvastatin Other (See Comments)    Family History: Family History  Problem Relation Age of Onset  . Colon cancer Father   . Cancer Mother   . Liver cancer Sister   . Kidney cancer Brother   .  Bone cancer Brother   . Kidney disease Neg Hx   . Prostate cancer Neg Hx   . Bladder Cancer Neg Hx      Social History:  reports that he quit smoking about 28 years ago. He has never used smokeless tobacco. He reports that he drinks about 7.2 oz of alcohol per week . He reports that he does not use drugs.  ROS: UROLOGY Frequent Urination?: No Hard to postpone urination?: No Burning/pain with urination?: No Get up at night to urinate?: Yes Leakage of urine?: No Urine stream starts and stops?: No Trouble starting stream?: No Do you have to strain to urinate?: No Blood in urine?: No Urinary tract infection?: No Sexually transmitted disease?: No Injury to kidneys or bladder?: No Painful intercourse?: No Weak stream?: No Erection problems?: Yes Penile pain?: No  Gastrointestinal Nausea?: No Vomiting?: No Indigestion/heartburn?: No Diarrhea?: No Constipation?: No  Constitutional Fever: No Night sweats?: No Weight loss?: No Fatigue?: No  Skin Skin rash/lesions?: No Itching?: No  Eyes Blurred vision?: No Double vision?: No  Ears/Nose/Throat Sore throat?: No Sinus problems?: No  Hematologic/Lymphatic Swollen glands?: No Easy bruising?: No  Cardiovascular Leg swelling?: Yes Chest pain?: No  Respiratory Cough?: No Shortness of breath?: No  Endocrine Excessive thirst?: No  Musculoskeletal Back pain?: No Joint pain?: No  Neurological Headaches?: No Dizziness?: No  Psychologic Depression?: No Anxiety?: No  Physical Exam: BP 121/76   Pulse 69   Ht 5\' 11"  (1.803 m)   Wt 236 lb 12.8 oz (107.4 kg)   BMI 33.03 kg/m   Constitutional: Well nourished. Alert and oriented, No acute distress. HEENT: [place] AT, moist mucus membranes. Trachea midline, no masses. Cardiovascular: No clubbing, cyanosis, or edema. Respiratory: Normal respiratory effort, no increased work of breathing. GI: Abdomen is soft, non tender, non distended, no abdominal masses. Liver and spleen not palpable.  No hernias appreciated.  Stool sample for occult testing is not indicated.    GU: No CVA tenderness.  No bladder fullness or masses.  Patient with uncircumcised phallus. Foreskin easily retracted  Urethral meatus is patent.  No penile discharge. No penile lesions or rashes. Scrotum without lesions, cysts, rashes and/or edema.  Testicles are located scrotally bilaterally. No masses are appreciated in the testicles. Left and right epididymis are normal. Rectal: Patient with  normal sphincter tone. Anus and perineum without scarring or rashes. No rectal masses are appreciated. Prostate is approximately 55 grams, no nodules are appreciated. Seminal vesicles are normal. Skin: No rashes, bruises or suspicious lesions. Lymph: No cervical or inguinal adenopathy. Neurologic: Grossly intact, no focal deficits, moving all 4 extremities. Psychiatric: Normal mood and affect.  Laboratory Data: PSA History:  3.6 ng/mL on 05/22/2014  2.6 ng/mL on 12/02/2015  I have reviewed the labs   Assessment & Plan:    1. BPH with LUTS  - IPSS score is 6/2, it is a little worse  - Continue conservative management, avoiding bladder irritants and timed voiding's  - Continue tamsulosin 0.4 mg daily, refill given today  - Was on finasteride but could not tolerate the side effects  - RTC in 12 months for IPSS, PSA and exam    2. Erectile dysfunction:   - SHIM score 13  - failed PDE5-inhibitors  - TSH and testosterone drawn today per AUA guidelines for ED  - discussed Trimix injections, might be interested due to his brother having success with the injection, would like to discuss with wife before pursuing - given hand out on  injections  - encouraged the patient to have a sleep study, as this can be a risk factor for ED, he will speak with his PCP - he states he's lost 30 pounds and feels she does not have sleep apnea  Return in about 1 year (around 12/01/2017) for IPSS, SHIM, PSA and exam.  Zara Council, [facility]  [place] [address] [place],  [address] [phone]

## 2016-12-01 ENCOUNTER — Encounter: Payer: Self-pay | Admitting: Urology

## 2016-12-01 ENCOUNTER — Ambulatory Visit: Payer: Medicare Other | Admitting: Urology

## 2016-12-01 VITALS — BP 121/76 | HR 69 | Ht 71.0 in | Wt 236.8 lb

## 2016-12-01 DIAGNOSIS — N138 Other obstructive and reflux uropathy: Secondary | ICD-10-CM

## 2016-12-01 DIAGNOSIS — R351 Nocturia: Secondary | ICD-10-CM | POA: Diagnosis not present

## 2016-12-01 DIAGNOSIS — N529 Male erectile dysfunction, unspecified: Secondary | ICD-10-CM

## 2016-12-01 DIAGNOSIS — N401 Enlarged prostate with lower urinary tract symptoms: Secondary | ICD-10-CM

## 2016-12-01 MED ORDER — TAMSULOSIN HCL 0.4 MG PO CAPS: 0.4000 mg | ORAL_CAPSULE | Freq: Every day | ORAL | 3 refills | Status: DC

## 2016-12-01 NOTE — Addendum Note (Signed)
Addended by: Orlene Erm on: 12/01/2016 09:01 AM   Modules accepted: Orders

## 2016-12-02 ENCOUNTER — Telehealth: Payer: Self-pay

## 2016-12-02 LAB — TSH: TSH: 2.45 u[IU]/mL (ref 0.450–4.500)

## 2016-12-02 LAB — TESTOSTERONE: TESTOSTERONE: 322 ng/dL (ref 264–916)

## 2016-12-02 NOTE — Telephone Encounter (Signed)
LMOM-most recent labs are normal.  

## 2016-12-02 NOTE — Telephone Encounter (Signed)
-----   Message from Nori Riis, PA-C sent at 12/02/2016  7:51 AM EDT ----- Please let Mr. Nitschke know that his testosterone and thyroid levels are normal.

## 2016-12-05 LAB — PSA

## 2016-12-07 LAB — SPECIMEN STATUS REPORT

## 2016-12-07 LAB — PSA: PROSTATE SPECIFIC AG, SERUM: 3 ng/mL (ref 0.0–4.0)

## 2016-12-09 ENCOUNTER — Telehealth: Payer: Self-pay

## 2016-12-09 NOTE — Telephone Encounter (Signed)
Patient notified

## 2016-12-09 NOTE — Telephone Encounter (Signed)
-----   Message from Nori Riis, PA-C sent at 12/08/2016  5:26 PM EDT ----- Please let Mr. Fulco know that his PSA is stable at 3.0.  We will see him next year.

## 2016-12-15 DIAGNOSIS — Z800 Family history of malignant neoplasm of digestive organs: Secondary | ICD-10-CM | POA: Insufficient documentation

## 2017-03-18 DIAGNOSIS — R151 Fecal smearing: Secondary | ICD-10-CM | POA: Insufficient documentation

## 2017-05-05 ENCOUNTER — Ambulatory Visit: Payer: Medicare Other | Admitting: Anesthesiology

## 2017-05-05 ENCOUNTER — Ambulatory Visit
Admission: RE | Admit: 2017-05-05 | Discharge: 2017-05-05 | Disposition: A | Discharge status: Home / Self Care | Payer: Medicare Other | Source: Ambulatory Visit | Attending: Internal Medicine | Admitting: Internal Medicine

## 2017-05-05 ENCOUNTER — Encounter
Admission: RE | Disposition: A | Discharge status: Home / Self Care | Payer: Self-pay | Source: Ambulatory Visit | Attending: Internal Medicine

## 2017-05-05 DIAGNOSIS — Z87442 Personal history of urinary calculi: Secondary | ICD-10-CM | POA: Diagnosis not present

## 2017-05-05 DIAGNOSIS — Z85820 Personal history of malignant melanoma of skin: Secondary | ICD-10-CM | POA: Diagnosis not present

## 2017-05-05 DIAGNOSIS — Z800 Family history of malignant neoplasm of digestive organs: Secondary | ICD-10-CM | POA: Diagnosis not present

## 2017-05-05 DIAGNOSIS — M5136 Other intervertebral disc degeneration, lumbar region: Secondary | ICD-10-CM | POA: Diagnosis not present

## 2017-05-05 DIAGNOSIS — D123 Benign neoplasm of transverse colon: Secondary | ICD-10-CM | POA: Insufficient documentation

## 2017-05-05 DIAGNOSIS — Z7984 Long term (current) use of oral hypoglycemic drugs: Secondary | ICD-10-CM | POA: Insufficient documentation

## 2017-05-05 DIAGNOSIS — Z791 Long term (current) use of non-steroidal anti-inflammatories (NSAID): Secondary | ICD-10-CM | POA: Insufficient documentation

## 2017-05-05 DIAGNOSIS — Z7982 Long term (current) use of aspirin: Secondary | ICD-10-CM | POA: Insufficient documentation

## 2017-05-05 DIAGNOSIS — E669 Obesity, unspecified: Secondary | ICD-10-CM | POA: Diagnosis not present

## 2017-05-05 DIAGNOSIS — E1142 Type 2 diabetes mellitus with diabetic polyneuropathy: Secondary | ICD-10-CM | POA: Diagnosis not present

## 2017-05-05 DIAGNOSIS — I10 Essential (primary) hypertension: Secondary | ICD-10-CM | POA: Insufficient documentation

## 2017-05-05 DIAGNOSIS — Z79899 Other long term (current) drug therapy: Secondary | ICD-10-CM | POA: Diagnosis not present

## 2017-05-05 DIAGNOSIS — K5730 Diverticulosis of large intestine without perforation or abscess without bleeding: Secondary | ICD-10-CM | POA: Insufficient documentation

## 2017-05-05 DIAGNOSIS — N400 Enlarged prostate without lower urinary tract symptoms: Secondary | ICD-10-CM | POA: Diagnosis not present

## 2017-05-05 DIAGNOSIS — E785 Hyperlipidemia, unspecified: Secondary | ICD-10-CM | POA: Diagnosis not present

## 2017-05-05 DIAGNOSIS — K621 Rectal polyp: Secondary | ICD-10-CM | POA: Insufficient documentation

## 2017-05-05 DIAGNOSIS — Z1211 Encounter for screening for malignant neoplasm of colon: Secondary | ICD-10-CM | POA: Diagnosis not present

## 2017-05-05 DIAGNOSIS — K635 Polyp of colon: Secondary | ICD-10-CM | POA: Insufficient documentation

## 2017-05-05 DIAGNOSIS — Z87891 Personal history of nicotine dependence: Secondary | ICD-10-CM | POA: Insufficient documentation

## 2017-05-05 DIAGNOSIS — Z6833 Body mass index (BMI) 33.0-33.9, adult: Secondary | ICD-10-CM | POA: Insufficient documentation

## 2017-05-05 DIAGNOSIS — Z86010 Personal history of colonic polyps: Secondary | ICD-10-CM | POA: Diagnosis present

## 2017-05-05 DIAGNOSIS — K640 First degree hemorrhoids: Secondary | ICD-10-CM | POA: Insufficient documentation

## 2017-05-05 HISTORY — DX: Other intervertebral disc degeneration, lumbar region: M51.36

## 2017-05-05 HISTORY — DX: Personal history of urinary calculi: Z87.442

## 2017-05-05 HISTORY — DX: Personal history of colonic polyps: Z86.010

## 2017-05-05 HISTORY — DX: Other intervertebral disc degeneration, lumbar region without mention of lumbar back pain or lower extremity pain: M51.369

## 2017-05-05 HISTORY — PX: COLONOSCOPY WITH PROPOFOL: SHX5780

## 2017-05-05 HISTORY — DX: Malignant (primary) neoplasm, unspecified: C80.1

## 2017-05-05 HISTORY — DX: Personal history of adenomatous and serrated colon polyps: Z86.0101

## 2017-05-05 HISTORY — DX: Polyneuropathy in diseases classified elsewhere: G63

## 2017-05-05 HISTORY — DX: Type 2 diabetes mellitus without complications: E11.9

## 2017-05-05 LAB — GLUCOSE, CAPILLARY: Glucose-Capillary: 116 mg/dL — ABNORMAL HIGH (ref 65–99)

## 2017-05-05 SURGERY — COLONOSCOPY WITH PROPOFOL
Anesthesia: General

## 2017-05-05 MED ORDER — SODIUM CHLORIDE 0.9 % IV SOLN
INTRAVENOUS | Status: DC
  Administered 2017-05-05: 11:00:00 via INTRAVENOUS
  Administered 2017-05-05: 1000 mL via INTRAVENOUS

## 2017-05-05 MED ORDER — PROPOFOL 10 MG/ML IV BOLUS
INTRAVENOUS | Status: AC
  Filled 2017-05-05: qty 20

## 2017-05-05 MED ORDER — PROPOFOL 500 MG/50ML IV EMUL
INTRAVENOUS | Status: AC
  Filled 2017-05-05: qty 50

## 2017-05-05 MED ORDER — PROPOFOL 500 MG/50ML IV EMUL
INTRAVENOUS | Status: DC | PRN
  Administered 2017-05-05: 180 ug/kg/min via INTRAVENOUS

## 2017-05-05 MED ORDER — FENTANYL CITRATE (PF) 100 MCG/2ML IJ SOLN
INTRAMUSCULAR | Status: AC
  Filled 2017-05-05: qty 2

## 2017-05-05 MED ORDER — PROPOFOL 10 MG/ML IV BOLUS
INTRAVENOUS | Status: DC | PRN
  Administered 2017-05-05: 100 mg via INTRAVENOUS

## 2017-05-05 MED ORDER — LIDOCAINE 2% (20 MG/ML) 5 ML SYRINGE
INTRAMUSCULAR | Status: DC | PRN
  Administered 2017-05-05: 30 mg via INTRAVENOUS

## 2017-05-05 MED ORDER — FENTANYL CITRATE (PF) 100 MCG/2ML IJ SOLN
INTRAMUSCULAR | Status: DC | PRN
  Administered 2017-05-05 (×2): 50 ug via INTRAVENOUS

## 2017-05-05 MED ORDER — PHENYLEPHRINE HCL 10 MG/ML IJ SOLN
INTRAMUSCULAR | Status: DC | PRN
  Administered 2017-05-05: 100 ug via INTRAVENOUS

## 2017-05-05 NOTE — Anesthesia Preprocedure Evaluation (Signed)
Anesthesia Evaluation  Patient identified by MRN, date of birth, ID band Patient awake    Reviewed: Allergy & Precautions, H&P , NPO status , reviewed documented beta blocker date and time   Airway Mallampati: II       Dental  (+) Caps   Pulmonary former smoker,    Pulmonary exam normal        Cardiovascular hypertension, Normal cardiovascular exam     Neuro/Psych    GI/Hepatic   Endo/Other  diabetes  Renal/GU Renal disease     Musculoskeletal   Abdominal   Peds  Hematology   Anesthesia Other Findings   Reproductive/Obstetrics                             Anesthesia Physical Anesthesia Plan  ASA: III  Anesthesia Plan: General   Post-op Pain Management:    Induction:   PONV Risk Score and Plan: 2 and Propofol infusion  Airway Management Planned:   Additional Equipment:   Intra-op Plan:   Post-operative Plan:   Informed Consent: I have reviewed the patients History and Physical, chart, labs and discussed the procedure including the risks, benefits and alternatives for the proposed anesthesia with the patient or authorized representative who has indicated his/her understanding and acceptance.   Dental Advisory Given  Plan Discussed with: CRNA  Anesthesia Plan Comments:         Anesthesia Quick Evaluation

## 2017-05-05 NOTE — Anesthesia Postprocedure Evaluation (Signed)
Anesthesia Post Note  Patient: Fred Anderson  Procedure(s) Performed: COLONOSCOPY WITH PROPOFOL (N/A )  Patient location during evaluation: Endoscopy Anesthesia Type: General Level of consciousness: awake and alert Pain management: pain level controlled Vital Signs Assessment: post-procedure vital signs reviewed and stable Respiratory status: spontaneous breathing, nonlabored ventilation, respiratory function stable and patient connected to nasal cannula oxygen Cardiovascular status: blood pressure returned to baseline and stable Postop Assessment: no apparent nausea or vomiting Anesthetic complications: no     Last Vitals:  Vitals:   05/05/17 1125 05/05/17 1131  BP: (!) 92/58 130/60  Pulse:  68  Resp:  15  Temp:    SpO2:      Last Pain:  Vitals:   05/05/17 1121  TempSrc: Tympanic  PainSc: Asleep                 Alphonsus Sias

## 2017-05-05 NOTE — Transfer of Care (Signed)
Immediate Anesthesia Transfer of Care Note  Patient: Fred Anderson  Procedure(s) Performed: COLONOSCOPY WITH PROPOFOL (N/A )  Patient Location: PACU and Endoscopy Unit  Anesthesia Type:General  Level of Consciousness: sedated  Airway & Oxygen Therapy: Patient Spontanous Breathing and Patient connected to nasal cannula oxygen  Post-op Assessment: Report given to RN and Post -op Vital signs reviewed and stable  Post vital signs: Reviewed and stable  Last Vitals:  Vitals:   05/05/17 1007 05/05/17 1121  BP: 138/72   Pulse: (!) 56   Resp: 20   Temp: (!) 36.4 C (!) 36.2 C  SpO2: 98%     Last Pain:  Vitals:   05/05/17 1121  TempSrc: Tympanic  PainSc: Asleep         Complications: No apparent anesthesia complications

## 2017-05-05 NOTE — H&P (Signed)
Outpatient short stay form Pre-procedure 05/05/2017 10:37 AM Fred Anderson K. Fred Anderson, M.D.  Primary Physician: Fred Anderson, M.D.  Reason for visit:  Personal hx of tubular adenoma of the colon  History of present illness:  Patient's pleasant 72 year old male presenting for colon polyp surveillance. He has his colonoscopy in February 2014 revealing 1 cecal adenoma. He has a family history of colon cancer in both sister and brother. Patient denies any abdominal pain, change in bowel habits or rectal bleeding frequently. He has a history of hemorrhoids which were banded by Dr. Tamala Julian of Gen. surgery a few years ago.    Current Facility-Administered Medications:  .  0.9 %  sodium chloride infusion, , Intravenous, Continuous, Fred Anderson, Fred Pike, MD, Last Rate: 20 mL/hr at 05/05/17 1022, 1,000 mL at 05/05/17 1022  Medications Prior to Admission  Medication Sig Dispense Refill Last Dose  . aspirin 325 MG tablet Take 325 mg by mouth daily.   Past Week at Unknown time  . fenofibrate 160 MG tablet Take 160 mg by mouth daily.   Past Week at Unknown time  . finasteride (PROSCAR) 5 MG tablet Take by mouth.   Past Week at Unknown time  . gabapentin (NEURONTIN) 300 MG capsule Take by mouth.   Past Week at Unknown time  . lovastatin (MEVACOR) 20 MG tablet TAKE 1 TABLET (20 MG TOTAL) BY MOUTH DAILY WITH DINNER.   Past Week at Unknown time  . Multiple Vitamin (MULTIVITAMIN) tablet Take 1 tablet by mouth daily.   05/04/2017 at Unknown time  . Multiple Vitamins-Iron (MULTIVITAMIN/IRON PO) Take by mouth.   05/04/2017 at Unknown time  . Multiple Vitamins-Minerals (MULTIVITAMIN ADULT EXTRA C PO) Take by mouth.   05/04/2017 at Unknown time  . naproxen (NAPROSYN) 500 MG tablet Take 500 mg by mouth 2 (two) times daily with a meal.   Past Week at Unknown time  . naproxen sodium (ANAPROX) 220 MG tablet Take by mouth.   Past Week at Unknown time  . niacin 500 MG tablet Take 500 mg by mouth at bedtime.   05/04/2017 at Unknown time   . Omega-3 Fatty Acids (FISH OIL) 500 MG CAPS Take by mouth.   05/04/2017 at Unknown time  . Omega-3 Fatty Acids (OMEGA 3 PO) Take by mouth.   05/04/2017 at Unknown time  . tamsulosin (FLOMAX) 0.4 MG CAPS capsule Take 1 capsule (0.4 mg total) by mouth daily. 90 capsule 3 Past Week at Unknown time  . gabapentin (NEURONTIN) 300 MG capsule Take by mouth.   Taking  . metFORMIN (GLUCOPHAGE) 500 MG tablet Take by mouth.   Not Taking at Unknown time  . oxyCODONE-acetaminophen (PERCOCET/ROXICET) 5-325 MG per tablet Take 1 tablet by mouth every 6 (six) hours as needed for severe pain. (Patient not taking: Reported on 12/02/2015) 15 tablet 0 Not Taking at Unknown time     Allergies  Allergen Reactions  . Atorvastatin Other (See Comments)    Muscle pain.     Past Medical History:  Diagnosis Date  . Arthritis   . Blood in semen   . BPH (benign prostatic hypertrophy)   . Cancer ([place])    melanoma on back  . DDD (degenerative disc disease), lumbar   . Diabetes mellitus without complication ([place])    TYPE 2  . Erectile dysfunction   . Hematospermia   . Hemorrhoids   . History of adenomatous polyp of colon   . History of kidney stones   . Hyperlipidemia   . Hypertension   .  Kidney stones   . Obesity   . 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[place])   . Prediabetes     Review of systems:      Physical Exam  General appearance: alert, cooperative and appears stated age Resp: clear to auscultation bilaterally Cardio: regular rate and rhythm, S1, S2 normal, no murmur, click, rub or gallop GI: soft, non-tender; bowel sounds normal; no masses,  no organomegaly Extremities: extremities normal, atraumatic, no cyanosis or edema     Planned procedures: Proceed with colonoscopy. The patient understands the nature of the planned procedure, indications, risks, alternatives and potential complications including but not limited to bleeding, infection, perforation, damage to internal  organs and possible oversedation/side effects from anesthesia. The patient agrees and gives consent to proceed.  Please refer to procedure notes for findings, recommendations and patient disposition/instructions.    Fred Anderson K. Fred Anderson, M.D. Gastroenterology 05/05/2017  10:37 AM

## 2017-05-05 NOTE — Anesthesia Post-op Follow-up Note (Signed)
Anesthesia QCDR form completed.        

## 2017-05-05 NOTE — Op Note (Signed)
[facility] Gastroenterology Patient Name: Fred Anderson Procedure Date: 05/05/2017 10:45 AM MRN: [id number] Account #: [id number] Date of Birth: [birth year] Admit Type: Outpatient Age: 72 Room: [facility] ENDO ROOM 2 Gender: Male Note Status: Finalized Procedure:            Colonoscopy Indications:          High risk colon cancer surveillance: Personal history                        of adenoma less than 10 mm in size Providers:            Kyia Rhude K. Myriam Brandhorst MD, MD Medicines:            Propofol per Anesthesia Complications:        No immediate complications. Procedure:            Pre-Anesthesia Assessment:                       - The risks and benefits of the procedure and the                        sedation options and risks were discussed with the                        patient. All questions were answered and informed                        consent was obtained.                       - Patient identification and proposed procedure were                        verified prior to the procedure by the nurse. The                        procedure was verified in the procedure room.                       - ASA Grade Assessment: II - A patient with mild                        systemic disease.                       - After reviewing the risks and benefits, the patient                        was deemed in satisfactory condition to undergo the                        procedure.                       After obtaining informed consent, the colonoscope was                        passed under direct vision. Throughout the procedure,                        the patient's blood pressure, pulse, and oxygen  saturations were monitored continuously. The                        Colonoscope was introduced through the anus and                        advanced to the the cecum, identified by appendiceal                        orifice and ileocecal valve. The colonoscopy  was                        performed without difficulty. The patient tolerated the                        procedure well. The quality of the bowel preparation                        was good. Findings:      The perianal and digital rectal examinations were normal. Pertinent       negatives include normal sphincter tone and no palpable rectal lesions.      A few small-mouthed diverticula were found in the sigmoid colon.      Two sessile polyps were found in the sigmoid colon. The polyps were 2 to       3 mm in size. These polyps were removed with a jumbo cold forceps.       Resection and retrieval were complete.      A 3 mm polyp was found in the transverse colon. The polyp was sessile.       The polyp was removed with a jumbo cold forceps. Resection and retrieval       were complete.      A 4 mm polyp was found in the transverse colon. The polyp was sessile.       The polyp was removed with a cold snare. Resection and retrieval were       complete.      Two sessile polyps were found in the rectum. The polyps were 2 to 3 mm       in size. These polyps were removed with a jumbo cold forceps. Resection       and retrieval were complete.      Non-bleeding internal hemorrhoids were found during retroflexion. The       hemorrhoids were Grade I (internal hemorrhoids that do not prolapse).      The exam was otherwise without abnormality. Impression:           - Diverticulosis in the sigmoid colon.                       - Two 2 to 3 mm polyps in the sigmoid colon, removed                        with a jumbo cold forceps. Resected and retrieved.                       - One 3 mm polyp in the transverse colon, removed with                        a jumbo cold forceps. Resected and retrieved.                       -  One 4 mm polyp in the transverse colon, removed with                        a cold snare. Resected and retrieved.                       - Two 2 to 3 mm polyps in the rectum, removed with  a                        jumbo cold forceps. Resected and retrieved.                       - Non-bleeding internal hemorrhoids.                       - The examination was otherwise normal. Recommendation:       - Patient has a contact number available for                        emergencies. The signs and symptoms of potential                        delayed complications were discussed with the patient.                        Return to normal activities tomorrow. Written discharge                        instructions were provided to the patient.                       - Resume previous diet.                       - Continue present medications.                       - Await pathology results.                       - Repeat colonoscopy is recommended for surveillance.                        The colonoscopy date will be determined after pathology                        results from today's exam become available for review.                       - Return to GI clinic PRN.                       - The findings and recommendations were discussed with                        the patient and their spouse. Procedure Code(s):    --- Professional ---                       [phone], Colonoscopy, flexible; with removal of tumor(s),  polyp(s), or other lesion(s) by snare technique                       [id number], 59, Colonoscopy, flexible; with biopsy, single                        or multiple Diagnosis Code(s):    --- Professional ---                       K57.30, Diverticulosis of large intestine without                        perforation or abscess without bleeding                       D12.3, Benign neoplasm of transverse colon (hepatic                        flexure or splenic flexure)                       K62.1, Rectal polyp                       D12.5, Benign neoplasm of sigmoid colon                       K64.0, First degree hemorrhoids                       Z86.010, Personal  history of colonic polyps CPT copyright 2016 [employer]. All rights reserved. The codes documented in this report are preliminary and upon coder review may  be revised to meet current compliance requirements. Efrain Sella MD, MD 05/05/2017 11:20:35 AM This report has been signed electronically. Number of Addenda: 0 Note Initiated On: 05/05/2017 10:45 AM Scope Withdrawal Time: 0 hours 12 minutes 33 seconds  Total Procedure Duration: 0 hours 17 minutes 57 seconds       [facility]

## 2017-05-05 NOTE — Interval H&P Note (Signed)
History and Physical Interval Note:  05/05/2017 10:39 AM  Fred Anderson  has presented today for surgery, with the diagnosis of TUBULAR ADENOMA OF COLON  The various methods of treatment have been discussed with the patient and family. After consideration of risks, benefits and other options for treatment, the patient has consented to  Procedure(s): COLONOSCOPY WITH PROPOFOL (N/A) as a surgical intervention .  The patient's history has been reviewed, patient examined, no change in status, stable for surgery.  I have reviewed the patient's chart and labs.  Questions were answered to the patient's satisfaction.     [place], [place]

## 2017-05-06 ENCOUNTER — Encounter: Payer: Self-pay | Admitting: Internal Medicine

## 2017-05-06 LAB — SURGICAL PATHOLOGY

## 2017-11-25 ENCOUNTER — Other Ambulatory Visit: Payer: Medicare Other

## 2017-11-25 DIAGNOSIS — N138 Other obstructive and reflux uropathy: Secondary | ICD-10-CM

## 2017-11-25 DIAGNOSIS — N401 Enlarged prostate with lower urinary tract symptoms: Principal | ICD-10-CM

## 2017-11-26 LAB — PSA: Prostate Specific Ag, Serum: 3 ng/mL (ref 0.0–4.0)

## 2017-11-29 ENCOUNTER — Ambulatory Visit: Payer: Medicare Other | Admitting: Urology

## 2017-11-29 ENCOUNTER — Encounter: Payer: Self-pay | Admitting: Urology

## 2017-11-29 VITALS — BP 116/70 | HR 55 | Resp 16 | Ht 65.0 in | Wt 249.0 lb

## 2017-11-29 DIAGNOSIS — N401 Enlarged prostate with lower urinary tract symptoms: Secondary | ICD-10-CM

## 2017-11-29 DIAGNOSIS — R351 Nocturia: Secondary | ICD-10-CM | POA: Diagnosis not present

## 2017-11-29 DIAGNOSIS — N138 Other obstructive and reflux uropathy: Secondary | ICD-10-CM | POA: Diagnosis not present

## 2017-11-29 DIAGNOSIS — N529 Male erectile dysfunction, unspecified: Secondary | ICD-10-CM | POA: Diagnosis not present

## 2017-11-29 DIAGNOSIS — M5136 Other intervertebral disc degeneration, lumbar region: Secondary | ICD-10-CM | POA: Insufficient documentation

## 2017-11-29 MED ORDER — TAMSULOSIN HCL 0.4 MG PO CAPS: 0.4000 mg | ORAL_CAPSULE | Freq: Every day | ORAL | 4 refills | Status: DC

## 2017-11-29 MED ORDER — TADALAFIL 5 MG PO TABS: 5.0000 mg | ORAL_TABLET | Freq: Every day | ORAL | 0 refills | Status: DC | PRN

## 2017-11-29 NOTE — Progress Notes (Signed)
11/29/2017 9:15 AM   Fred Anderson 1945/05/06 [id number]  Referring provider: Ezequiel Kayser, MD [facility] [place], [place] [address]  Chief Complaint  Patient presents with  . Benign Prostatic Hypertrophy    HPI: Patient is a 72 year old Caucasian male who presents today for a one year follow up for ED and BPH with LUTS.    BPH WITH LUTS His IPSS score today is 4, which is mild lower urinary tract symptomatology. He is pleased with his quality life due to his urinary symptoms.  His previous IPSS score was 6/2.  His previous PVR is 30 mL.  He does not have any complaints today.  Patient denies any gross hematuria, dysuria or suprapubic/flank pain.  Patient denies any fevers, chills, nausea or vomiting.   He does not have a family history of PCa.  He is currently on tamsulosin 0.4 mg daily.    IPSS    Row Name 11/29/17 0800         International Prostate Symptom Score   How often have you had the sensation of not emptying your bladder?  Less than 1 in 5     How often have you had to urinate less than every two hours?  Less than 1 in 5 times     How often have you found you stopped and started again several times when you urinated?  Not at All     How often have you found it difficult to postpone urination?  Not at All     How often have you had a weak urinary stream?  Less than 1 in 5 times     How often have you had to strain to start urination?  Not at All     How many times did you typically get up at night to urinate?  1 Time     Total IPSS Score  4       Quality of Life due to urinary symptoms   If you were to spend the rest of your life with your urinary condition just the way it is now how would you feel about that?  Pleased        Score:  1-7 Mild 8-19 Moderate 20-35 Severe  Erectile dysfunction His SHIM score is 17, which is mild ED.   He has been having difficulty with erections for several years.   His previous SHIM score was 13.   His major complaint is lack of firmness in his erections, so they not satisfactory for intercourse.  His libido is preserved.   His risk factors for ED are age, BPH, DM, HTN, HLD, sleep apnea, alcohol abuse, pain medication and blood pressure medications.  He denies any painful erections or curvatures with his erections.   He has tried PDE5-inhibitors in the past and they were not effective.  TSH and testosterone levels were normal.   He has sex spontaneously twice monthly.   SHIM    Row Name 11/29/17 0845         SHIM: Over the last 6 months:   How do you rate your confidence that you could get and keep an erection?  Low     When you had erections with sexual stimulation, how often were your erections hard enough for penetration (entering your partner)?  Most Times (much more than half the time)     During sexual intercourse, how often were you able to maintain your erection after you had penetrated (entered)  your partner?  Most Times (much more than half the time)     During sexual intercourse, how difficult was it to maintain your erection to completion of intercourse?  Difficult     When you attempted sexual intercourse, how often was it satisfactory for you?  Most Times (much more than half the time)       SHIM Total Score   SHIM  17        Score: 1-7 Severe ED 8-11 Moderate ED 12-16 Mild-Moderate ED 17-21 Mild ED 22-25 No ED  PMH: Past Medical History:  Diagnosis Date  . Arthritis   . Blood in semen   . BPH (benign prostatic hypertrophy)   . Cancer ([place])    melanoma on back  . DDD (degenerative disc disease), lumbar   . Diabetes mellitus without complication ([place])    TYPE 2  . Erectile dysfunction   . Hematospermia   . Hemorrhoids   . History of adenomatous polyp of colon   . History of kidney stones   . Hyperlipidemia   . Hypertension   . Kidney stones   . Obesity   . 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[place])   . Prediabetes     Surgical  History: Past Surgical History:  Procedure Laterality Date  . BACK SURGERY  age 17   Broken back  . COLONOSCOPY WITH PROPOFOL N/A 05/05/2017   Procedure: COLONOSCOPY WITH PROPOFOL;  Surgeon: Toledo, Benay Pike, MD;  Location: [facility] ENDOSCOPY;  Service: Gastroenterology;  Laterality: N/A;  . HEMORRHOID SURGERY    . SMALL INTESTINE SURGERY      Home Medications:  Allergies as of 11/29/2017      Reactions   Atorvastatin Other (See Comments)   Muscle pain.      Medication List        Accurate as of 11/29/17  9:15 AM. Always use your most recent med list.          aspirin 325 MG tablet Take 325 mg by mouth daily.   Fish Oil 500 MG Caps Take by mouth.   lovastatin 40 MG tablet Commonly known as:  MEVACOR TAKE 1 TABLET (40 MG TOTAL) BY MOUTH DAILY WITH DINNER. FOR CHOLESTEROL   multivitamin tablet Take 1 tablet by mouth daily.   niacin 500 MG tablet Take 500 mg by mouth at bedtime.   tadalafil 5 MG tablet Commonly known as:  CIALIS Take 1 tablet (5 mg total) by mouth daily as needed for erectile dysfunction.   tamsulosin 0.4 MG Caps capsule Commonly known as:  FLOMAX Take 1 capsule (0.4 mg total) by mouth daily.       Allergies:  Allergies  Allergen Reactions  . Atorvastatin Other (See Comments)    Muscle pain.    Family History: Family History  Problem Relation Age of Onset  . Colon cancer Father   . Cancer Mother   . Liver cancer Sister   . Kidney cancer Brother   . Bone cancer Brother   . Kidney disease Neg Hx   . Prostate cancer Neg Hx   . Bladder Cancer Neg Hx     Social History:  reports that he quit smoking about 29 years ago. He has never used smokeless tobacco. He reports that he drinks about 12.0 standard drinks of alcohol per week. He reports that he does not use drugs.  ROS: UROLOGY Frequent Urination?: No Hard to postpone urination?: No Burning/pain with urination?: No Get up at night to urinate?: No  Leakage of urine?: No Urine stream  starts and stops?: No Trouble starting stream?: No Do you have to strain to urinate?: No Blood in urine?: No Urinary tract infection?: No Sexually transmitted disease?: No Injury to kidneys or bladder?: No Painful intercourse?: No Weak stream?: No Erection problems?: Yes Penile pain?: No  Gastrointestinal Nausea?: No Vomiting?: No Indigestion/heartburn?: No Diarrhea?: No Constipation?: No  Constitutional Fever: No Night sweats?: No Weight loss?: No Fatigue?: No  Skin Skin rash/lesions?: No Itching?: No  Eyes Blurred vision?: No Double vision?: No  Ears/Nose/Throat Sore throat?: No Sinus problems?: No  Hematologic/Lymphatic Swollen glands?: No Easy bruising?: No  Cardiovascular Leg swelling?: No Chest pain?: No  Respiratory Cough?: No Shortness of breath?: No  Endocrine Excessive thirst?: No  Musculoskeletal Back pain?: Yes Joint pain?: No  Neurological Headaches?: No Dizziness?: No  Psychologic Depression?: No Anxiety?: No  Physical Exam: BP 116/70 (BP Location: Left Arm, Patient Position: Sitting, Cuff Size: Normal)   Pulse (!) 55   Resp 16   Ht 5\' 5"  (1.651 m)   Wt 249 lb (112.9 kg)   BMI 41.44 kg/m   Constitutional: Well nourished. Alert and oriented, No acute distress. HEENT: [place] AT, moist mucus membranes. Trachea midline, no masses. Cardiovascular: No clubbing, cyanosis, or edema. Respiratory: Normal respiratory effort, no increased work of breathing. GI: Abdomen is soft, non tender, non distended, no abdominal masses. Liver and spleen not palpable.  No hernias appreciated.  Stool sample for occult testing is not indicated.   GU: No CVA tenderness.  No bladder fullness or masses.  Patient with uncircumcised phallus.  Foreskin easily retracted.   Urethral meatus is patent.  No penile discharge. No penile lesions or rashes. Scrotum without lesions, cysts, rashes and/or edema.  Testicles are located scrotally bilaterally. No masses are  appreciated in the testicles. Left and right epididymis are normal. Rectal: Patient with  normal sphincter tone. Anus and perineum without scarring or rashes. No rectal masses are appreciated. Prostate is approximately 55 grams, could only palpate the apex, no nodules are appreciated.   Skin: No rashes, bruises or suspicious lesions. Lymph: No cervical or inguinal adenopathy. Neurologic: Grossly intact, no focal deficits, moving all 4 extremities. Psychiatric: Normal mood and affect.  Laboratory Data: PSA History:  3.6 ng/mL on 05/22/2014  2.6 ng/mL on 12/02/2015  3.0 ng/mL on 12/01/2016  3.0 ng/mL on 11/25/2017 I have reviewed the labs   Assessment & Plan:    1. BPH with LUTS IPSS score is 4/1, it is improving Continue conservative management, avoiding bladder irritants and timed voiding's Continue tamsulosin 0.4 mg daily, refill given today Was on finasteride but could not tolerate the side effects RTC in 12 months for IPSS, PSA and exam    2. Erectile dysfunction:  SHIM score 17, it is improved failed PDE5-inhibitors - would like to try Cialis again Given script for 5 mg Cialis   Return in about 1 year (around 11/30/2018) for IPSS, SHIM, PSA and exam.  Zara Council, [facility]  [place] [address] [place], [place] [address] [phone]

## 2018-04-25 ENCOUNTER — Observation Stay: Payer: Medicare Other | Admitting: Anesthesiology

## 2018-04-25 ENCOUNTER — Observation Stay
Admission: EM | Admit: 2018-04-25 | Discharge: 2018-04-26 | Disposition: A | Discharge status: Home / Self Care | Payer: Medicare Other | Attending: General Surgery | Admitting: General Surgery

## 2018-04-25 ENCOUNTER — Encounter
Admission: EM | Disposition: A | Discharge status: Home / Self Care | Payer: Self-pay | Source: Home / Self Care | Attending: Emergency Medicine

## 2018-04-25 ENCOUNTER — Emergency Department: Payer: Medicare Other

## 2018-04-25 ENCOUNTER — Other Ambulatory Visit: Payer: Self-pay

## 2018-04-25 ENCOUNTER — Encounter: Payer: Self-pay | Admitting: Emergency Medicine

## 2018-04-25 DIAGNOSIS — M5136 Other intervertebral disc degeneration, lumbar region: Secondary | ICD-10-CM | POA: Diagnosis not present

## 2018-04-25 DIAGNOSIS — Z87891 Personal history of nicotine dependence: Secondary | ICD-10-CM | POA: Diagnosis not present

## 2018-04-25 DIAGNOSIS — I10 Essential (primary) hypertension: Secondary | ICD-10-CM | POA: Insufficient documentation

## 2018-04-25 DIAGNOSIS — Z888 Allergy status to other drugs, medicaments and biological substances status: Secondary | ICD-10-CM | POA: Diagnosis not present

## 2018-04-25 DIAGNOSIS — N200 Calculus of kidney: Secondary | ICD-10-CM | POA: Diagnosis present

## 2018-04-25 DIAGNOSIS — E1142 Type 2 diabetes mellitus with diabetic polyneuropathy: Secondary | ICD-10-CM | POA: Diagnosis not present

## 2018-04-25 DIAGNOSIS — N400 Enlarged prostate without lower urinary tract symptoms: Secondary | ICD-10-CM | POA: Insufficient documentation

## 2018-04-25 DIAGNOSIS — Z7982 Long term (current) use of aspirin: Secondary | ICD-10-CM | POA: Insufficient documentation

## 2018-04-25 DIAGNOSIS — K35891 Other acute appendicitis without perforation, with gangrene: Secondary | ICD-10-CM | POA: Diagnosis not present

## 2018-04-25 DIAGNOSIS — Z86010 Personal history of colonic polyps: Secondary | ICD-10-CM | POA: Diagnosis not present

## 2018-04-25 DIAGNOSIS — Z79899 Other long term (current) drug therapy: Secondary | ICD-10-CM | POA: Insufficient documentation

## 2018-04-25 DIAGNOSIS — M1990 Unspecified osteoarthritis, unspecified site: Secondary | ICD-10-CM | POA: Insufficient documentation

## 2018-04-25 DIAGNOSIS — E782 Mixed hyperlipidemia: Secondary | ICD-10-CM | POA: Diagnosis not present

## 2018-04-25 DIAGNOSIS — Z85820 Personal history of malignant melanoma of skin: Secondary | ICD-10-CM | POA: Diagnosis not present

## 2018-04-25 DIAGNOSIS — Z6834 Body mass index (BMI) 34.0-34.9, adult: Secondary | ICD-10-CM | POA: Diagnosis not present

## 2018-04-25 DIAGNOSIS — K37 Unspecified appendicitis: Secondary | ICD-10-CM

## 2018-04-25 DIAGNOSIS — K353 Acute appendicitis with localized peritonitis, without perforation or gangrene: Secondary | ICD-10-CM | POA: Diagnosis present

## 2018-04-25 HISTORY — PX: LAPAROSCOPIC APPENDECTOMY: SHX408

## 2018-04-25 LAB — COMPREHENSIVE METABOLIC PANEL
ALT: 16 U/L (ref 0–44)
AST: 17 U/L (ref 15–41)
Albumin: 4.1 g/dL (ref 3.5–5.0)
Alkaline Phosphatase: 54 U/L (ref 38–126)
Anion gap: 9 (ref 5–15)
BUN: 19 mg/dL (ref 8–23)
CHLORIDE: 103 mmol/L (ref 98–111)
CO2: 24 mmol/L (ref 22–32)
CREATININE: 1.04 mg/dL (ref 0.61–1.24)
Calcium: 9.5 mg/dL (ref 8.9–10.3)
GFR calc Af Amer: >60 mL/min (ref >60)
Glucose, Bld: 171 mg/dL — ABNORMAL HIGH (ref 70–99)
POTASSIUM: 4 mmol/L (ref 3.5–5.1)
SODIUM: 136 mmol/L (ref 135–145)
Total Bilirubin: 1.9 mg/dL — ABNORMAL HIGH (ref 0.3–1.2)
Total Protein: 7.7 g/dL (ref 6.5–8.1)

## 2018-04-25 LAB — GLUCOSE, CAPILLARY
Glucose-Capillary: 138 mg/dL — ABNORMAL HIGH (ref 70–99)
Glucose-Capillary: 206 mg/dL — ABNORMAL HIGH (ref 70–99)

## 2018-04-25 LAB — LIPASE, BLOOD: LIPASE: 21 U/L (ref 11–51)

## 2018-04-25 SURGERY — APPENDECTOMY, LAPAROSCOPIC
Anesthesia: General | Site: Abdomen

## 2018-04-25 MED ORDER — SODIUM CHLORIDE 0.9 % IV SOLN
Freq: Once | INTRAVENOUS | Status: AC
  Administered 2018-04-25: 15:00:00 via INTRAVENOUS

## 2018-04-25 MED ORDER — LIDOCAINE HCL (CARDIAC) PF 100 MG/5ML IV SOSY
PREFILLED_SYRINGE | INTRAVENOUS | Status: DC | PRN
  Administered 2018-04-25: 60 mg via INTRAVENOUS

## 2018-04-25 MED ORDER — ORAL CARE MOUTH RINSE
15.0000 mL | Freq: Two times a day (BID) | OROMUCOSAL | Status: DC
  Administered 2018-04-26: 15 mL via OROMUCOSAL

## 2018-04-25 MED ORDER — DEXAMETHASONE SODIUM PHOSPHATE 10 MG/ML IJ SOLN
INTRAMUSCULAR | Status: AC
  Filled 2018-04-25: qty 1

## 2018-04-25 MED ORDER — ROCURONIUM BROMIDE 50 MG/5ML IV SOLN
INTRAVENOUS | Status: AC
  Filled 2018-04-25: qty 1

## 2018-04-25 MED ORDER — FENTANYL CITRATE (PF) 100 MCG/2ML IJ SOLN
25.0000 ug | INTRAMUSCULAR | Status: DC | PRN
  Administered 2018-04-25 (×4): 25 ug via INTRAVENOUS

## 2018-04-25 MED ORDER — PIPERACILLIN-TAZOBACTAM 3.375 G IVPB
INTRAVENOUS | Status: AC
  Filled 2018-04-25: qty 50

## 2018-04-25 MED ORDER — SODIUM CHLORIDE 0.9 % IV SOLN
INTRAVENOUS | Status: DC
  Administered 2018-04-25 (×3): via INTRAVENOUS

## 2018-04-25 MED ORDER — SUCCINYLCHOLINE CHLORIDE 20 MG/ML IJ SOLN
INTRAMUSCULAR | Status: AC
  Filled 2018-04-25: qty 1

## 2018-04-25 MED ORDER — TAMSULOSIN HCL 0.4 MG PO CAPS: 0.4000 mg | ORAL_CAPSULE | Freq: Every day | ORAL | Status: DC

## 2018-04-25 MED ORDER — FENTANYL CITRATE (PF) 100 MCG/2ML IJ SOLN
INTRAMUSCULAR | Status: AC
  Filled 2018-04-25: qty 2

## 2018-04-25 MED ORDER — CEFTRIAXONE SODIUM 1 G IJ SOLR
1.0000 g | Freq: Once | INTRAMUSCULAR | Status: AC
  Administered 2018-04-25: 1 g via INTRAVENOUS
  Filled 2018-04-25: qty 10

## 2018-04-25 MED ORDER — ONDANSETRON 4 MG PO TBDP: 4.0000 mg | ORAL_TABLET | Freq: Four times a day (QID) | ORAL | Status: DC | PRN

## 2018-04-25 MED ORDER — SUGAMMADEX SODIUM 200 MG/2ML IV SOLN
INTRAVENOUS | Status: DC | PRN
  Administered 2018-04-25: 230 mg via INTRAVENOUS

## 2018-04-25 MED ORDER — ONDANSETRON HCL 4 MG/2ML IJ SOLN
4.0000 mg | Freq: Once | INTRAMUSCULAR | Status: AC
  Administered 2018-04-25: 4 mg via INTRAVENOUS
  Filled 2018-04-25: qty 2

## 2018-04-25 MED ORDER — SUGAMMADEX SODIUM 500 MG/5ML IV SOLN
INTRAVENOUS | Status: AC
  Filled 2018-04-25: qty 5

## 2018-04-25 MED ORDER — PROPOFOL 10 MG/ML IV BOLUS
INTRAVENOUS | Status: DC | PRN
  Administered 2018-04-25: 170 mg via INTRAVENOUS

## 2018-04-25 MED ORDER — MORPHINE SULFATE (PF) 4 MG/ML IV SOLN: 4.0000 mg | INTRAVENOUS | Status: DC | PRN

## 2018-04-25 MED ORDER — PIPERACILLIN-TAZOBACTAM 3.375 G IVPB
3.3750 g | Freq: Three times a day (TID) | INTRAVENOUS | Status: DC
  Administered 2018-04-25 – 2018-04-26 (×3): 3.375 g via INTRAVENOUS
  Filled 2018-04-25 (×2): qty 50

## 2018-04-25 MED ORDER — ROCURONIUM BROMIDE 100 MG/10ML IV SOLN
INTRAVENOUS | Status: DC | PRN
  Administered 2018-04-25: 5 mg via INTRAVENOUS
  Administered 2018-04-25: 10 mg via INTRAVENOUS
  Administered 2018-04-25: 20 mg via INTRAVENOUS
  Administered 2018-04-25: 25 mg via INTRAVENOUS

## 2018-04-25 MED ORDER — EPHEDRINE SULFATE 50 MG/ML IJ SOLN
INTRAMUSCULAR | Status: DC | PRN
  Administered 2018-04-25: 10 mg via INTRAVENOUS

## 2018-04-25 MED ORDER — ONDANSETRON HCL 4 MG/2ML IJ SOLN: 4.0000 mg | Freq: Once | INTRAMUSCULAR | Status: DC | PRN

## 2018-04-25 MED ORDER — BUPIVACAINE-EPINEPHRINE 0.5% -1:200000 IJ SOLN
INTRAMUSCULAR | Status: DC | PRN
  Administered 2018-04-25: 20 mL

## 2018-04-25 MED ORDER — HYDROCODONE-ACETAMINOPHEN 5-325 MG PO TABS
1.0000 | ORAL_TABLET | ORAL | Status: DC | PRN
  Administered 2018-04-25 – 2018-04-26 (×2): 1 via ORAL
  Filled 2018-04-25 (×2): qty 1

## 2018-04-25 MED ORDER — ENOXAPARIN SODIUM 40 MG/0.4ML ~~LOC~~ SOLN
40.0000 mg | SUBCUTANEOUS | Status: DC
  Administered 2018-04-25: 40 mg via SUBCUTANEOUS
  Filled 2018-04-25 (×2): qty 0.4

## 2018-04-25 MED ORDER — ONDANSETRON HCL 4 MG/2ML IJ SOLN: 4.0000 mg | Freq: Four times a day (QID) | INTRAMUSCULAR | Status: DC | PRN

## 2018-04-25 MED ORDER — SEVOFLURANE IN SOLN
RESPIRATORY_TRACT | Status: AC
  Filled 2018-04-25: qty 250

## 2018-04-25 MED ORDER — FAMOTIDINE IN NACL 20-0.9 MG/50ML-% IV SOLN
20.0000 mg | Freq: Two times a day (BID) | INTRAVENOUS | Status: DC
  Administered 2018-04-25: 20 mg via INTRAVENOUS
  Filled 2018-04-25: qty 50

## 2018-04-25 MED ORDER — MORPHINE SULFATE (PF) 4 MG/ML IV SOLN
4.0000 mg | Freq: Once | INTRAVENOUS | Status: AC
  Administered 2018-04-25: 4 mg via INTRAVENOUS
  Filled 2018-04-25: qty 1

## 2018-04-25 MED ORDER — FENTANYL CITRATE (PF) 100 MCG/2ML IJ SOLN
INTRAMUSCULAR | Status: DC | PRN
  Administered 2018-04-25 (×2): 50 ug via INTRAVENOUS

## 2018-04-25 MED ORDER — PNEUMOCOCCAL VAC POLYVALENT 25 MCG/0.5ML IJ INJ: 0.5000 mL | INJECTION | INTRAMUSCULAR | Status: DC

## 2018-04-25 MED ORDER — DEXAMETHASONE SODIUM PHOSPHATE 10 MG/ML IJ SOLN
INTRAMUSCULAR | Status: DC | PRN
  Administered 2018-04-25: 5 mg via INTRAVENOUS

## 2018-04-25 MED ORDER — PROPOFOL 10 MG/ML IV BOLUS
INTRAVENOUS | Status: AC
  Filled 2018-04-25: qty 20

## 2018-04-25 MED ORDER — ONDANSETRON HCL 4 MG/2ML IJ SOLN
INTRAMUSCULAR | Status: DC | PRN
  Administered 2018-04-25: 4 mg via INTRAVENOUS

## 2018-04-25 MED ORDER — SUCCINYLCHOLINE CHLORIDE 20 MG/ML IJ SOLN
INTRAMUSCULAR | Status: DC | PRN
  Administered 2018-04-25: 120 mg via INTRAVENOUS

## 2018-04-25 MED ORDER — MIDAZOLAM HCL 2 MG/2ML IJ SOLN
INTRAMUSCULAR | Status: AC
  Filled 2018-04-25: qty 2

## 2018-04-25 MED ORDER — MIDAZOLAM HCL 5 MG/5ML IJ SOLN
INTRAMUSCULAR | Status: DC | PRN
  Administered 2018-04-25: 1 mg via INTRAVENOUS

## 2018-04-25 MED ORDER — PHENYLEPHRINE HCL 10 MG/ML IJ SOLN
INTRAMUSCULAR | Status: DC | PRN
  Administered 2018-04-25: 100 ug via INTRAVENOUS
  Administered 2018-04-25: 150 ug via INTRAVENOUS
  Administered 2018-04-25 (×3): 200 ug via INTRAVENOUS

## 2018-04-25 MED ORDER — ONDANSETRON HCL 4 MG/2ML IJ SOLN
INTRAMUSCULAR | Status: AC
  Filled 2018-04-25: qty 2

## 2018-04-25 SURGICAL SUPPLY — 40 items
APPLIER CLIP LOGIC TI 5 (MISCELLANEOUS) IMPLANT
BLADE SURG SZ11 CARB STEEL (BLADE) ×3 IMPLANT
CANISTER SUCT 1200ML W/VALVE (MISCELLANEOUS) ×3 IMPLANT
CHLORAPREP W/TINT 26ML (MISCELLANEOUS) ×3 IMPLANT
COVER WAND RF STERILE (DRAPES) ×3 IMPLANT
CUTTER FLEX LINEAR 45M (STAPLE) ×3 IMPLANT
DERMABOND ADVANCED (GAUZE/BANDAGES/DRESSINGS) ×2
DERMABOND ADVANCED .7 DNX12 (GAUZE/BANDAGES/DRESSINGS) ×1 IMPLANT
ELECT REM PT RETURN 9FT ADLT (ELECTROSURGICAL) ×3
ELECTRODE REM PT RTRN 9FT ADLT (ELECTROSURGICAL) ×1 IMPLANT
GLOVE BIO SURGEON STRL SZ 6.5 (GLOVE) ×2 IMPLANT
GLOVE BIO SURGEONS STRL SZ 6.5 (GLOVE) ×1
GOWN STRL REUS W/ TWL LRG LVL3 (GOWN DISPOSABLE) ×3 IMPLANT
GOWN STRL REUS W/TWL LRG LVL3 (GOWN DISPOSABLE) ×6
GRASPER SUT TROCAR 14GX15 (MISCELLANEOUS) ×3 IMPLANT
HANDLE YANKAUER SUCT BULB TIP (MISCELLANEOUS) ×3 IMPLANT
IRRIGATION STRYKERFLOW (MISCELLANEOUS) IMPLANT
IRRIGATOR STRYKERFLOW (MISCELLANEOUS)
IV NS 1000ML (IV SOLUTION) ×2
IV NS 1000ML BAXH (IV SOLUTION) ×1 IMPLANT
KIT TURNOVER KIT A (KITS) ×3 IMPLANT
LIGASURE LAP MARYLAND 5MM 37CM (ELECTROSURGICAL) IMPLANT
NEEDLE HYPO 22GX1.5 SAFETY (NEEDLE) ×3 IMPLANT
NEEDLE VERESS 14GA 120MM (NEEDLE) ×3 IMPLANT
NS IRRIG 500ML POUR BTL (IV SOLUTION) ×3 IMPLANT
PACK LAP CHOLECYSTECTOMY (MISCELLANEOUS) ×3 IMPLANT
POUCH ENDO CATCH 10MM SPEC (MISCELLANEOUS) ×3 IMPLANT
POUCH SPECIMEN RETRIEVAL 10MM (ENDOMECHANICALS) ×3 IMPLANT
RELOAD 45 VASCULAR/THIN (ENDOMECHANICALS) IMPLANT
RELOAD STAPLE TA45 3.5 REG BLU (ENDOMECHANICALS) ×3 IMPLANT
SCISSORS METZENBAUM CVD 33 (INSTRUMENTS) ×3 IMPLANT
SET TUBE SMOKE EVAC HIGH FLOW (TUBING) ×3 IMPLANT
SLEEVE SCD COMPRESS THIGH MED (MISCELLANEOUS) ×3 IMPLANT
SPONGE GAUZE 2X2 8PLY STER LF (GAUZE/BANDAGES/DRESSINGS) ×1
SPONGE GAUZE 2X2 8PLY STRL LF (GAUZE/BANDAGES/DRESSINGS) ×2 IMPLANT
SUT MNCRL AB 4-0 PS2 18 (SUTURE) ×3 IMPLANT
SUT VICRYL PLUS ABS 0 54 (SUTURE) ×3 IMPLANT
TRAY FOLEY MTR SLVR 16FR STAT (SET/KITS/TRAYS/PACK) ×3 IMPLANT
TROCAR XCEL 12X100 BLDLESS (ENDOMECHANICALS) ×3 IMPLANT
TROCAR XCEL NON-BLD 5MMX100MML (ENDOMECHANICALS) ×6 IMPLANT

## 2018-04-25 NOTE — Op Note (Signed)
Preoperative diagnosis: Acute appendicitis.  Postoperative diagnosis: Acute gangrenous appendicitis  Procedure: Laparoscopic appendectomy.  Anesthesia: GETA  Surgeon: Dr. Windell Moment, MD  Wound Classification: Contaminated  Indications: Patient is a 73 y.o. male  presented with right lower quadrant pain of 2 days of duration, fever, elevated WBC. Computed tomography scan and physical examination were consistent with acute appendicitis.   Findings: 1. Acutely gangrenous appendix 2. No peri-appendiceal abscess or phlegmon 3. Normal anatomy 4. Adequate hemostasis.   Description of procedure: The patient was placed on the operating table in the supine position. General anesthesia was induced. A time-out was completed verifying correct patient, procedure, site, positioning, and implant(s) and/or special equipment prior to beginning this procedure. A Foley catheter and orogastric tubes were placed. The abdomen was prepped and draped in the usual sterile fashion.  An incision was made in a natural skin line above the umbilicus.  An 12 mm trocar was inserted using a Hassan technique above the umbilical area.  The abdomen was insufflated with carbon dioxide to a pressure of 15 mmHg. The patient tolerated insufflation well.  The laparoscope was inserted and the abdomen inspected. No injuries from initial trocar placement were noted. Turbid fluid was noted in the right lower quadrant. Under direct visualization, an 5-mm trocar was inserted in the left lower quadrant lateral to the rectus muscle. A 5-mm port was then placed above the symphysis pubis on midline.  Care was taken to avoid injury to the bladder or inferior epigastric vessels. The table was placed in the Trendelenburg position with the right side elevated.  The cecum was gently grasped with an endoscopic graspers and pulled toward (the left upper quadrant). An atraumatic grasper was then passed through the suprapubic port and omentum was  dissected away until the appendix was identified. The appendix was then grasped and elevated. It was noted to be gangrenous.  An endoscopic linear cutting stapler was then used to divide and staple the base of the appendix.  The mesoappendix was divided with the LigaSure device. The appendix was placed in an endoscopic retrieval bag and removed.  The appendiceal stump was then irrigated and hemostasis was assured. Fluid was suctioned and no other pathology was identified.  Secondary trocars were removed under direct vision. No bleeding was noted. The laparoscope was withdrawn and the umbilical trocar removed. The abdomen was allowed to collapse. All trocar sites greater than 5 mm were closed with Vicryl 0. The skin was closed with subcuticular sutures Monocryl 3-0 of and steristrips.  The patient tolerated the procedure well and was taken to the postanesthesia care unit in satisfactory condition.   Specimen: Appendix  Complications: None  Estimated Blood Loss: 10 mL

## 2018-04-25 NOTE — Anesthesia Procedure Notes (Signed)
Procedure Name: Intubation Date/Time: 04/25/2018 5:09 PM Performed by: Dionne Bucy, CRNA Pre-anesthesia Checklist: Patient identified, Patient being monitored, Timeout performed, Emergency Drugs available and Suction available Patient Re-evaluated:Patient Re-evaluated prior to induction Oxygen Delivery Method: Circle system utilized Preoxygenation: Pre-oxygenation with 100% oxygen Induction Type: IV induction, Rapid sequence and Cricoid Pressure applied Ventilation: Mask ventilation without difficulty Laryngoscope Size: Mac and 4 Grade View: Grade II Tube type: Oral Tube size: 7.5 mm Number of attempts: 1 Airway Equipment and Method: Stylet Placement Confirmation: ETT inserted through vocal cords under direct vision,  positive ETCO2 and breath sounds checked- equal and bilateral Secured at: 23 cm Tube secured with: Tape Dental Injury: Teeth and Oropharynx as per pre-operative assessment

## 2018-04-25 NOTE — ED Provider Notes (Signed)
[facility] Emergency Department Provider Note       Time seen: ----------------------------------------- 2:27 PM on 04/25/2018 -----------------------------------------   I have reviewed the triage vital signs and the nursing notes.  HISTORY   Chief Complaint Abdominal Pain    HPI Fred Anderson is a 73 y.o. male with a history of arthritis, degenerative disc disease, diabetes, hemorrhoids, hyperlipidemia, hypertension, kidney stones who presents to the ED for lower abdominal pain across his waistline.  Patient was seen by his primary care doctor today and was told he had an elevated white blood cell count.  UA also showed blood and nitrites.  He has not had any fevers, vomiting but has had some diarrhea.  Past Medical History:  Diagnosis Date  . Arthritis   . Blood in semen   . BPH (benign prostatic hypertrophy)   . Cancer ([place])    melanoma on back  . DDD (degenerative disc disease), lumbar   . Diabetes mellitus without complication ([place])    TYPE 2  . Erectile dysfunction   . Hematospermia   . Hemorrhoids   . History of adenomatous polyp of colon   . History of kidney stones   . Hyperlipidemia   . Hypertension   . Kidney stones   . Obesity   . 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[place])   . Prediabetes     Patient Active Problem List   Diagnosis Date Noted  . DDD (degenerative disc disease), lumbar 11/29/2017  . Fecal smearing 03/18/2017  . Family history of colon cancer 12/15/2016  . History of adenomatous polyp of colon 09/18/2016  . High risk medication use 09/14/2016  . Diabetic peripheral neuropathy associated with type 2 diabetes mellitus ([place]) 09/05/2015  . Erectile dysfunction of organic origin 11/29/2014  . BPH with obstruction/lower urinary tract symptoms 09/02/2014  . Hematospermia 09/02/2014  . HLD (hyperlipidemia) 02/05/2014  . Borderline diabetes 02/05/2014  . Combined hyperlipidemia associated with type 2  diabetes mellitus ([place]) 02/05/2014    Past Surgical History:  Procedure Laterality Date  . BACK SURGERY  age 65   Broken back  . COLONOSCOPY WITH PROPOFOL N/A 05/05/2017   Procedure: COLONOSCOPY WITH PROPOFOL;  Surgeon: Toledo, Benay Pike, MD;  Location: [facility] ENDOSCOPY;  Service: Gastroenterology;  Laterality: N/A;  . HEMORRHOID SURGERY    . SMALL INTESTINE SURGERY      Allergies Atorvastatin  Social History Social History   Tobacco Use  . Smoking status: Former Smoker    Last attempt to quit: 08/15/1988    Years since quitting: 29.7  . Smokeless tobacco: Never Used  . Tobacco comment: quit september 11th 1991  Substance Use Topics  . Alcohol use: Yes    Alcohol/week: 12.0 standard drinks    Types: 12 Cans of beer per week  . Drug use: No   Review of Systems Constitutional: Negative for fever. Cardiovascular: Negative for chest pain. Respiratory: Negative for shortness of breath. Gastrointestinal: Positive for abdominal pain, diarrhea Musculoskeletal: Negative for back pain. Skin: Negative for rash. Neurological: Negative for headaches, focal weakness or numbness.  All systems negative/normal/unremarkable except as stated in the HPI  ____________________________________________   PHYSICAL EXAM:  VITAL SIGNS: ED Triage Vitals  Enc Vitals Group     BP 04/25/18 1309 125/62     Pulse Rate 04/25/18 1309 99     Resp 04/25/18 1309 18     Temp 04/25/18 1309 97.8 F (36.6 C)     Temp Source 04/25/18 1309 Oral  SpO2 04/25/18 1309 93 %     Weight 04/25/18 1306 250 lb (113.4 kg)     Height 04/25/18 1306 5\' 11"  (1.803 m)     Head Circumference --      Peak Flow --      Pain Score 04/25/18 1306 7     Pain Loc --      Pain Edu? --      Excl. in [place]? --     Constitutional: Alert and oriented. Well appearing and in no distress. Eyes: Conjunctivae are normal. Normal extraocular movements. Cardiovascular: Normal rate, regular rhythm. No murmurs, rubs, or  gallops. Respiratory: Normal respiratory effort without tachypnea nor retractions. Breath sounds are clear and equal bilaterally. No wheezes/rales/rhonchi. Gastrointestinal: Exquisite right lower quadrant tenderness, no rebound or guarding.  Normal bowel sounds. Musculoskeletal: Nontender with normal range of motion in extremities. No lower extremity tenderness nor edema. Neurologic:  Normal speech and language. No gross focal neurologic deficits are appreciated.  Skin:  Skin is warm, dry and intact. No rash noted. Psychiatric: Mood and affect are normal. Speech and behavior are normal.  ____________________________________________  ED COURSE:  As part of my medical decision making, I reviewed the following data within the [place] History obtained from family if available, nursing notes, old chart and ekg, as well as notes from prior ED visits. Patient presented for lower abdominal pain, we will assess with labs and imaging as indicated at this time.   Procedures ____________________________________________   LABS (pertinent positives/negatives)  Labs Reviewed  COMPREHENSIVE METABOLIC PANEL - Abnormal; Notable for the following components:      Result Value   Glucose, Bld 171 (*)    Total Bilirubin 1.9 (*)    All other components within normal limits  LIPASE, BLOOD    RADIOLOGY Images were viewed by me  CT renal protocol IMPRESSION: Acute appendicitis is noted without definite abscess formation.  5 mm bladder calculus is noted. Mild diffuse urinary bladder wall thickening is noted which may represent lack of distension, but cystitis can not be excluded.  Hepatic steatosis.  Bilateral nonobstructive nephrolithiasis.  Aortic Atherosclerosis (ICD10-I70.0). ____________________________________________   DIFFERENTIAL DIAGNOSIS   Renal colic, UTI, pyelonephritis, appendicitis, cholecystitis, perforation  FINAL ASSESSMENT AND PLAN  Acute appendicitis,  kidney stones  Plan: The patient had presented for lower abdominal pain. Patient's labs did indicate hematuria as well as leukocytosis. Patient's imaging did reveal acute appendicitis without abscess formation.  I did give him IV Rocephin, I will discuss with the general surgeon for admission and appendectomy.   Laurence Aly, MD    Note: This note was generated in part or whole with voice recognition software. Voice recognition is usually quite accurate but there are transcription errors that can and very often do occur. I apologize for any typographical errors that were not detected and corrected.     Earleen Newport, MD 04/25/18 [phone]

## 2018-04-25 NOTE — Transfer of Care (Signed)
Immediate Anesthesia Transfer of Care Note  Patient: Fred Anderson  Procedure(s) Performed: APPENDECTOMY LAPAROSCOPIC (N/A Abdomen)  Patient Location: PACU  Anesthesia Type:General  Level of Consciousness: awake, alert  and patient cooperative  Airway & Oxygen Therapy: Patient Spontanous Breathing and Patient connected to face mask  Post-op Assessment: Report given to RN and Post -op Vital signs reviewed and stable  Post vital signs: Reviewed and stable  Last Vitals:  Vitals Value Taken Time  BP 116/60 04/25/2018  6:54 PM  Temp 36.7 C 04/25/2018  6:54 PM  Pulse 89 04/25/2018  6:59 PM  Resp 24 04/25/2018  6:59 PM  SpO2 99 % 04/25/2018  6:59 PM  Vitals shown include unvalidated device data.  Last Pain:  Vitals:   04/25/18 1609  TempSrc:   PainSc: 3          Complications: No apparent anesthesia complications

## 2018-04-25 NOTE — H&P (Signed)
SURGICAL CONSULTATION NOTE   HISTORY OF PRESENT ILLNESS (HPI):  73 y.o. male presented to [facility] ED for evaluation of abdominal pain. Patient reports started with abdominal pain 2 days ago.  Refers pain started on the supraumbilical area and radiated to the right lower quadrant.  He thought that he was passing a kidney stone but the pain continued to get worse and was not the same as his previous kidney stones.  Pain improved with pain medication.  Pain aggravated with deep palpation of the right lower quadrant.  Denies nausea or vomiting.  Denies fever.  At ED abdominal CT scan renal protocol was done looking for kidney stones but the appendix appears inflamed with fat stranding.  I personally evaluated the images.  PCP ordered CBC which shows leukocytosis of 15.  Patient denies chest pain or shortness of breath.  Surgery is consulted by Dr. Jimmye Norman in this context for evaluation and management of acute appendicitis.  PAST MEDICAL HISTORY (PMH):  Past Medical History:  Diagnosis Date  . Arthritis   . Blood in semen   . BPH (benign prostatic hypertrophy)   . Cancer ([place])    melanoma on back  . DDD (degenerative disc disease), lumbar   . 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 Erectile dysfunction   . Hematospermia   . Hemorrhoids   . History of adenomatous polyp of colon   . History of kidney stones   . Hyperlipidemia   . Hypertension   . Kidney stones   . Obesity   . 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[place])   . Prediabetes      PAST SURGICAL HISTORY ([place]):  Past Surgical History:  Procedure Laterality Date  . BACK SURGERY  age 37   Broken back  . COLONOSCOPY WITH PROPOFOL N/A 05/05/2017   Procedure: COLONOSCOPY WITH PROPOFOL;  Surgeon: Toledo, Benay Pike, MD;  Location: [facility] ENDOSCOPY;  Service: Gastroenterology;  Laterality: N/A;  . HEMORRHOID SURGERY    . SMALL INTESTINE SURGERY       MEDICATIONS:  Prior to Admission medications    Medication Sig Start Date End Date Taking? Authorizing Provider  aspirin 325 MG tablet Take 325 mg by mouth daily.    [provider]  lovastatin (MEVACOR) 40 MG tablet TAKE 1 TABLET (40 MG TOTAL) BY MOUTH DAILY WITH DINNER. FOR CHOLESTEROL 10/03/17   [provider]  Multiple Vitamin (MULTIVITAMIN) tablet Take 1 tablet by mouth daily.    [provider]  niacin 500 MG tablet Take 500 mg by mouth at bedtime.    [provider]  Omega-3 Fatty Acids (FISH OIL) 500 MG CAPS Take by mouth.    [provider]  tadalafil (CIALIS) 5 MG tablet Take 1 tablet (5 mg total) by mouth daily as needed for erectile dysfunction. 11/29/17   Zara Council A, PA-C  tamsulosin (FLOMAX) 0.4 MG CAPS capsule Take 1 capsule (0.4 mg total) by mouth daily. 11/29/17   Zara Council A, PA-C     ALLERGIES:  Allergies  Allergen Reactions  . Atorvastatin Other (See Comments)    Muscle pain.     SOCIAL HISTORY:  Social History   Socioeconomic History  . Marital status: Married    Spouse name: Not on file  . Number of children: Not on file  . Years of education: Not on file  . Highest education level: Not on file  Occupational History  . Not on file  Social Needs  .  Financial resource strain: Not on file  . Food insecurity:    Worry: Not on file    Inability: Not on file  . Transportation needs:    Medical: Not on file    Non-medical: Not on file  Tobacco Use  . Smoking status: Former Smoker    Last attempt to quit: 08/15/1988    Years since quitting: 29.7  . Smokeless tobacco: Never Used  . Tobacco comment: quit september 11th 1991  Substance and Sexual Activity  . Alcohol use: Yes    Alcohol/week: 12.0 standard drinks    Types: 12 Cans of beer per week  . Drug use: No  . Sexual activity: Not on file  Lifestyle  . Physical activity:    Days per week: Not on file    Minutes per session: Not on file  . Stress: Not on file  Relationships  . Social  connections:    Talks on phone: Not on file    Gets together: Not on file    Attends religious service: Not on file    Active member of club or organization: Not on file    Attends meetings of clubs or organizations: Not on file    Relationship status: Not on file  . Intimate partner violence:    Fear of current or ex partner: Not on file    Emotionally abused: Not on file    Physically abused: Not on file    Forced sexual activity: Not on file  Other Topics Concern  . Not on file  Social History Narrative  . Not on file    The patient currently resides (home / rehab facility / nursing home): Home The patient normally is (ambulatory / bedbound): Ambulatory   FAMILY HISTORY:  Family History  Problem Relation Age of Onset  . Colon cancer Father   . Cancer Mother   . Liver cancer Sister   . Kidney cancer Brother   . Bone cancer Brother   . Kidney disease Neg Hx   . Prostate cancer Neg Hx   . Bladder Cancer Neg Hx      REVIEW OF SYSTEMS:  Constitutional: denies weight loss, fever, chills, or sweats  Eyes: denies any other vision changes, history of eye injury  ENT: denies sore throat, hearing problems  Respiratory: denies shortness of breath, wheezing  Cardiovascular: denies chest pain, palpitations  Gastrointestinal: positive abdominal pain, denies nausea and vomiting Genitourinary: denies burning with urination or urinary frequency.  Positive for hematuria. Musculoskeletal: denies any other joint pains or cramps  Skin: denies any other rashes or skin discolorations  Neurological: denies any other headache, dizziness, weakness  Psychiatric: denies any other depression, anxiety   All other review of systems were negative   VITAL SIGNS:  Temp:  [97.8 F (36.6 C)] 97.8 F (36.6 C) (02/10 1309) Pulse Rate:  [99] 99 (02/10 1309) Resp:  [18] 18 (02/10 1309) BP: (125)/(62) 125/62 (02/10 1309) SpO2:  [93 %] 93 % (02/10 1309) Weight:  [113.4 kg] 113.4 kg (02/10 1306)      Height: 5\' 11"  (180.3 cm) Weight: 113.4 kg BMI (Calculated): 34.88   INTAKE/OUTPUT:  This shift: No intake/output data recorded.  Last 2 shifts: @IOLAST2SHIFTS @   PHYSICAL EXAM:  Constitutional:  -- Normal body habitus  -- Awake, alert, and oriented x3  Eyes:  -- Pupils equally round and reactive to light  -- No scleral icterus  Ear, nose, and throat:  -- No jugular venous distension  Pulmonary:  --  No crackles  -- Equal breath sounds bilaterally -- Breathing non-labored at rest Cardiovascular:  -- S1, S2 present  -- No pericardial rubs Gastrointestinal:  -- Abdomen soft, tender on right lower quadrant on deep palpation, non-distended, no guarding or rebound tenderness -- No abdominal masses appreciated, pulsatile or otherwise  Musculoskeletal and Integumentary:  -- Wounds or skin discoloration: None appreciated -- Extremities: B/L UE and LE FROM, hands and feet warm, no edema  Neurologic:  -- Motor function: intact and symmetric -- Sensation: intact and symmetric   Labs:  CBC Latest Ref Rng & Units 10/16/2014 10/16/2014  WBC 4.0 - 10.5 K/uL - 5.6  Hemoglobin 13.0 - 17.0 g/dL 17.7(H) 16.6  Hematocrit 39.0 - 52.0 % 52.0 48.2  Platelets 150 - 400 K/uL - 166   CMP Latest Ref Rng & Units 04/25/2018 10/16/2014 10/16/2014  Glucose 70 - 99 mg/dL 171(H) 110(H) 105(H)  BUN 8 - 23 mg/dL 19 22(H) 19  Creatinine 0.61 - 1.24 mg/dL 1.04 1.00 1.03  Sodium 135 - 145 mmol/L 136 142 139  Potassium 3.5 - 5.1 mmol/L 4.0 4.2 4.2  Chloride 98 - 111 mmol/L 103 108 108  CO2 22 - 32 mmol/L 24 - 20(L)  Calcium 8.9 - 10.3 mg/dL 9.5 - 10.3  Total Protein 6.5 - 8.1 g/dL 7.7 - 6.9  Total Bilirubin 0.3 - 1.2 mg/dL 1.9(H) - 0.8  Alkaline Phos 38 - 126 U/L 54 - 38  AST 15 - 41 U/L 17 - 24  ALT 0 - 44 U/L 16 - 17    Imaging studies:  EXAM: CT ABDOMEN AND PELVIS WITHOUT CONTRAST  TECHNIQUE: Multidetector CT imaging of the abdomen and pelvis was performed following the standard protocol without IV  contrast.  COMPARISON:  CT scan of October 16, 2014.  FINDINGS: Lower chest: No acute abnormality.  Hepatobiliary: No gallstones or biliary dilatation is noted. Hepatic steatosis is noted.  Pancreas: Unremarkable. No pancreatic ductal dilatation or surrounding inflammatory changes.  Spleen: Normal in size without focal abnormality.  Adrenals/Urinary Tract: Adrenal glands appear normal. Bilateral nephrolithiasis is noted. 3 mm calculus seen in right renal pelvis. No hydronephrosis or renal obstruction is noted. 5 mm calculus is seen in urinary bladder. Mild urinary bladder wall thickening is noted which may represent cystitis or possibly lack of distension.  Stomach/Bowel: The stomach appears normal. There is no evidence of bowel obstruction. The appendix is enlarged with surrounding inflammation consistent with appendicitis.  Appendix: Location: Right lower quadrant.  Diameter: 1 cm.  Appendicolith: No.  Mucosal hyper-enhancement: Unenhanced study.  Extraluminal gas: No.  Periappendiceal collection: No.  Vascular/Lymphatic: Aortic atherosclerosis. No enlarged abdominal or pelvic lymph nodes.  Reproductive: Stable prostatic enlargement.  Other: Stable mild fat containing right inguinal hernia is noted.  Musculoskeletal: No acute or significant osseous findings.  IMPRESSION: Acute appendicitis is noted without definite abscess formation.  5 mm bladder calculus is noted. Mild diffuse urinary bladder wall thickening is noted which may represent lack of distension, but cystitis can not be excluded.  Hepatic steatosis.  Bilateral nonobstructive nephrolithiasis.  Aortic Atherosclerosis (ICD10-I70.0).   Electronically Signed   By: Marijo Conception, M.D.   On: 04/25/2018 14:41   Assessment/Plan:  73 y.o. male with acute appendicitis, complicated by pertinent comorbidities including diabetes hyperlipidemia hypertension history of kidney  stones, past medical history of small bowel obstruction with exploratory laparotomy in the 1980s.  Patient with history, physical exam and images consistent with acute appendicitis. Patient oriented about diagnosis and surgical management  as treatment. Patient oriented about goals of surgery and its risk including: bowel injury, infection, abscess, bleeding, leak from cecum, intestinal adhesions, bowel obstruction, fistula, injury to the ureter among others.  Patient oriented about the high risk of open appendectomy due to previous surgery due to bowel obstruction.  I expect significant small bowel adhesions that can complicate the surgery. Patient understood and agreed to proceed with surgery. Will admit patient, already started on antibiotic therapy, will give IV hydration since patient is NPO and schedule to OR.   Arnold Long, MD

## 2018-04-25 NOTE — Anesthesia Preprocedure Evaluation (Signed)
Anesthesia Evaluation  Patient identified by MRN, date of birth, ID band Patient awake    Reviewed: Allergy & Precautions, H&P , NPO status , reviewed documented beta blocker date and time   Airway Mallampati: II       Dental  (+) Caps   Pulmonary former smoker,    Pulmonary exam normal        Cardiovascular hypertension, Normal cardiovascular exam     Neuro/Psych  Neuromuscular disease negative psych ROS   GI/Hepatic Neg liver ROS,   Endo/Other  diabetes  Renal/GU stones     Musculoskeletal  (+) Arthritis , Osteoarthritis,    Abdominal   Peds negative pediatric ROS (+)  Hematology negative hematology ROS (+)   Anesthesia Other Findings Past Medical History: No date: Arthritis No date: Blood in semen No date: BPH (benign prostatic hypertrophy) No date: Cancer ([place])     Comment:  melanoma on back No date: DDD (degenerative disc disease), lumbar No date: 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 No date: Erectile dysfunction No date: Hematospermia No date: Hemorrhoids No date: History of adenomatous polyp of colon No date: History of kidney stones No date: Hyperlipidemia No date: Hypertension No date: Kidney stones No date: Obesity No date: 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[facility]) No date: Prediabetes  Reproductive/Obstetrics                             Anesthesia Physical  Anesthesia Plan  ASA: III  Anesthesia Plan: General   Post-op Pain Management:    Induction: Intravenous, Rapid sequence and Cricoid pressure planned  PONV Risk Score and Plan:   Airway Management Planned: Oral ETT  Additional Equipment:   Intra-op Plan:   Post-operative Plan: Extubation in OR  Informed Consent: I have reviewed the patients History and Physical, chart, labs and discussed the procedure including the risks, benefits and alternatives for the  proposed anesthesia with the patient or authorized representative who has indicated his/her understanding and acceptance.     Dental Advisory Given  Plan Discussed with: CRNA  Anesthesia Plan Comments:         Anesthesia Quick Evaluation

## 2018-04-25 NOTE — ED Notes (Signed)
Surgeon at bedside.  

## 2018-04-25 NOTE — Anesthesia Post-op Follow-up Note (Signed)
Anesthesia QCDR form completed.        

## 2018-04-25 NOTE — ED Triage Notes (Signed)
Here for lower abdominal pain across waistline.  Increased WBC at PCP office today.  Also had UA that showed blood and nitrites (care everywhere).  No fever. No vomiting. Has had diarrhea.  Ambulatory.  Unlabored. Color WNL.

## 2018-04-26 ENCOUNTER — Encounter: Payer: Self-pay | Admitting: General Surgery

## 2018-04-26 MED ORDER — LIVING WELL WITH DIABETES BOOK
Freq: Once | Status: AC
  Administered 2018-04-26: 04:00:00
  Filled 2018-04-26: qty 1

## 2018-04-26 NOTE — Discharge Summary (Signed)
  Patient ID: Fred Anderson MRN: [id number] DOB/AGE: September 21, 1945 73 y.o.  Admit date: 04/25/2018 Discharge date: 04/26/2018   Discharge Diagnoses:  Active Problems:   Acute appendicitis with localized peritonitis   Procedures: Laparoscopic appendectomy  Hospital Course: Patient admitted last night with acute appendicitis.  Taken to the OR for laparoscopic appendectomy.  Tolerated the procedure well.  Pain is controlled without the need of narcotics during the night.  Patient is ambulating.  Patient tolerating diet.  Wounds are dry and clean and wants to go home.  Physical Exam  Constitutional: He is well-developed, well-nourished, and in no distress.  Cardiovascular: Normal rate and regular rhythm.  Pulmonary/Chest: Effort normal. No respiratory distress.  Abdominal: Soft. Bowel sounds are normal. He exhibits no distension.  Wounds are dry and clean   Consults: None  Disposition: Discharge disposition: 01-Home or Self Care       Discharge Instructions    Diet - low sodium heart healthy   Complete by:  As directed      Allergies as of 04/26/2018      Reactions   Atorvastatin Other (See Comments)   Muscle pain.      Medication List    TAKE these medications   aspirin 325 MG tablet Take 325 mg by mouth daily.   Fish Oil 500 MG Caps Take 1,000 mg by mouth 2 (two) times daily.   lovastatin 40 MG tablet Commonly known as:  MEVACOR Take 40 mg by mouth at bedtime.   tadalafil 5 MG tablet Commonly known as:  CIALIS Take 1 tablet (5 mg total) by mouth daily as needed for erectile dysfunction.   tamsulosin 0.4 MG Caps capsule Commonly known as:  FLOMAX Take 1 capsule (0.4 mg total) by mouth daily.      Follow-up Information    Herbert Pun, MD Follow up in 2 week(s).   Specialty:  General Surgery Contact information: [address] [place] [address] [phone]

## 2018-04-26 NOTE — Discharge Instructions (Signed)
Diet: Resume home heart healthy regular diet.   Activity: No heavy lifting >20 pounds (children, pets, laundry, garbage) or strenuous activity until follow-up, but light activity and walking are encouraged. Do not drive or drink alcohol if taking narcotic pain medications.  Wound care: May shower with soapy water and pat dry (do not rub incisions), but no baths or submerging incision underwater until follow-up. (no swimming)   Medications: Resume all home medications. For mild to moderate pain: acetaminophen (Tylenol) or ibuprofen (if no kidney disease). Combining Tylenol with alcohol can substantially increase your risk of causing liver disease.   Call office [phone]) at any time if any questions, worsening pain, fevers/chills, bleeding, drainage from incision site, or other concerns.

## 2018-04-26 NOTE — Progress Notes (Signed)
Patient ready to be discharged to home. Spoke with Dr Windell Moment. Ordered soft diet. Patient tolerated diet well. Iv removed. Discharge instructions given. D/C to home via POV.

## 2018-04-27 LAB — SURGICAL PATHOLOGY

## 2018-04-28 NOTE — Anesthesia Postprocedure Evaluation (Signed)
Anesthesia Post Note  Patient: Fred Anderson  Procedure(s) Performed: APPENDECTOMY LAPAROSCOPIC (N/A Abdomen)  Patient location during evaluation: PACU Anesthesia Type: General Level of consciousness: awake and alert and oriented Pain management: pain level controlled Vital Signs Assessment: post-procedure vital signs reviewed and stable Respiratory status: spontaneous breathing Cardiovascular status: blood pressure returned to baseline Anesthetic complications: no     Last Vitals:  Vitals:   04/25/18 2344 04/26/18 0408  BP: 110/71 114/74  Pulse: 74 74  Resp: 20 18  Temp: (!) 36.4 C (!) 36.4 C  SpO2: 98% 96%    Last Pain:  Vitals:   04/26/18 0542  TempSrc:   PainSc: 2                  Lonya Johannesen

## 2018-11-29 ENCOUNTER — Other Ambulatory Visit: Payer: Self-pay

## 2018-11-29 DIAGNOSIS — N138 Other obstructive and reflux uropathy: Secondary | ICD-10-CM

## 2018-11-30 ENCOUNTER — Other Ambulatory Visit: Payer: Medicare Other

## 2018-12-01 ENCOUNTER — Other Ambulatory Visit: Payer: Self-pay

## 2018-12-01 ENCOUNTER — Other Ambulatory Visit: Payer: Medicare Other

## 2018-12-01 DIAGNOSIS — N401 Enlarged prostate with lower urinary tract symptoms: Secondary | ICD-10-CM

## 2018-12-01 DIAGNOSIS — N138 Other obstructive and reflux uropathy: Secondary | ICD-10-CM

## 2018-12-01 NOTE — Progress Notes (Signed)
12/02/2018 8:51 AM   Fred Anderson January 01, 1946 [id number]  Referring provider: Ezequiel Kayser, MD [place] [facility] [place],  [place] [address]  Chief Complaint  Patient presents with  . Benign Prostatic Hypertrophy    HPI: Patient is a 73 year old male who presents today for a one year follow up for ED and BPH with LUTS.    BPH WITH LUTS His IPSS score today is 7, which is mild lower urinary tract symptomatology. He is pleased with his quality life due to his urinary symptoms.  His previous IPSS score was 4/1.  His previous PVR is 30 mL.  He does not have any complaints today.  Patient denies any gross hematuria, dysuria or suprapubic/flank pain.  Patient denies any fevers, chills, nausea or vomiting.   He does not have a family history of PCa.  He is currently on tamsulosin 0.4 mg daily.   IPSS    Row Name 12/02/18 0800         International Prostate Symptom Score   How often have you had the sensation of not emptying your bladder?  Less than half the time     How often have you had to urinate less than every two hours?  Less than 1 in 5 times     How often have you found you stopped and started again several times when you urinated?  About half the time     How often have you found it difficult to postpone urination?  Less than 1 in 5 times     How often have you had a weak urinary stream?  Not at All     How often have you had to strain to start urination?  Not at All     How many times did you typically get up at night to urinate?  None     Total IPSS Score  7       Quality of Life due to urinary symptoms   If you were to spend the rest of your life with your urinary condition just the way it is now how would you feel about that?  Pleased        Score:  1-7 Mild 8-19 Moderate 20-35 Severe  Erectile dysfunction His SHIM score is 5, which is severe ED.   He has been having difficulty with erections for several years.   His previous SHIM score was  17.  His major complaint is lack of firmness in his erections, so they not satisfactory for intercourse.  His libido is preserved.   His risk factors for ED are age, BPH, DM, HTN, HLD, sleep apnea, alcohol abuse, pain medication and blood pressure medications.  He has not had any painful erections or curvature with erections.  He has tried PDE5-inhibitors in the past and they were not effective.  TSH and testosterone levels were normal.   He is and his wife are no longer interested in sexual activity.   SHIM    Row Name 12/02/18 [phone]         SHIM: Over the last 6 months:   How do you rate your confidence that you could get and keep an erection?  Very Low     When you had erections with sexual stimulation, how often were your erections hard enough for penetration (entering your partner)?  Almost Never or Never     During sexual intercourse, how often were you able to maintain your erection after you had  penetrated (entered) your partner?  Almost Never or Never     During sexual intercourse, how difficult was it to maintain your erection to completion of intercourse?  Extremely Difficult     When you attempted sexual intercourse, how often was it satisfactory for you?  Almost Never or Never       SHIM Total Score   SHIM  5        Score: 1-7 Severe ED 8-11 Moderate ED 12-16 Mild-Moderate ED 17-21 Mild ED 22-25 No ED  PMH: Past Medical History:  Diagnosis Date  . Arthritis   . Blood in semen   . BPH (benign prostatic hypertrophy)   . Cancer ([place])    melanoma on back  . DDD (degenerative disc disease), lumbar   . Diabetes mellitus without complication ([place])    TYPE 2  . Erectile dysfunction   . Hematospermia   . Hemorrhoids   . History of adenomatous polyp of colon   . History of kidney stones   . Hyperlipidemia   . Hypertension   . Kidney stones   . Obesity   . 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[place])   . Prediabetes     Surgical History: Past Surgical  History:  Procedure Laterality Date  . BACK SURGERY  age 66   Broken back  . COLONOSCOPY WITH PROPOFOL N/A 05/05/2017   Procedure: COLONOSCOPY WITH PROPOFOL;  Surgeon: Toledo, Benay Pike, MD;  Location: [facility] ENDOSCOPY;  Service: Gastroenterology;  Laterality: N/A;  . HEMORRHOID SURGERY    . LAPAROSCOPIC APPENDECTOMY N/A 04/25/2018   Procedure: APPENDECTOMY LAPAROSCOPIC;  Surgeon: Herbert Pun, MD;  Location: [facility] ORS;  Service: General;  Laterality: N/A;  . SMALL INTESTINE SURGERY      Home Medications:  Allergies as of 12/02/2018      Reactions   Atorvastatin Other (See Comments)   Muscle pain.      Medication List       Accurate as of December 02, 2018  8:51 AM. If you have any questions, ask your nurse or doctor.        aspirin 325 MG tablet Take 325 mg by mouth daily.   Fish Oil 500 MG Caps Take 1,000 mg by mouth 2 (two) times daily.   lovastatin 40 MG tablet Commonly known as: MEVACOR Take 40 mg by mouth at bedtime.   tadalafil 5 MG tablet Commonly known as: CIALIS Take 1 tablet (5 mg total) by mouth daily as needed for erectile dysfunction.   tamsulosin 0.4 MG Caps capsule Commonly known as: FLOMAX Take 1 capsule (0.4 mg total) by mouth daily.       Allergies:  Allergies  Allergen Reactions  . Atorvastatin Other (See Comments)    Muscle pain.    Family History: Family History  Problem Relation Age of Onset  . Colon cancer Father   . Cancer Mother   . Liver cancer Sister   . Kidney cancer Brother   . Bone cancer Brother   . Kidney disease Neg Hx   . Prostate cancer Neg Hx   . Bladder Cancer Neg Hx     Social History:  reports that he quit smoking about 30 years ago. He has never used smokeless tobacco. He reports current alcohol use of about 12.0 standard drinks of alcohol per week. He reports that he does not use drugs.  ROS: UROLOGY Frequent Urination?: No Hard to postpone urination?: No Burning/pain with urination?: No Get up at  night to urinate?: No Leakage  of urine?: No Urine stream starts and stops?: No Trouble starting stream?: No Do you have to strain to urinate?: No Blood in urine?: No Urinary tract infection?: No Sexually transmitted disease?: No Injury to kidneys or bladder?: No Painful intercourse?: No Weak stream?: No Erection problems?: Yes Penile pain?: No  Gastrointestinal Nausea?: No Vomiting?: No Indigestion/heartburn?: No Diarrhea?: No Constipation?: No  Constitutional Fever: No Night sweats?: No Weight loss?: No Fatigue?: No  Skin Skin rash/lesions?: No Itching?: No  Eyes Blurred vision?: No Double vision?: No  Ears/Nose/Throat Sore throat?: No Sinus problems?: No  Hematologic/Lymphatic Swollen glands?: No Easy bruising?: No  Cardiovascular Leg swelling?: No Chest pain?: No  Respiratory Cough?: No Shortness of breath?: No  Endocrine Excessive thirst?: No  Musculoskeletal Back pain?: No Joint pain?: No  Neurological Headaches?: No Dizziness?: No  Psychologic Depression?: No Anxiety?: No  Physical Exam: BP (!) 172/100   Pulse 71   Ht 5\' 11"  (1.803 m)   Wt 250 lb (113.4 kg)   BMI 34.87 kg/m   Constitutional:  Well nourished. Alert and oriented, No acute distress. HEENT: [place] AT, moist mucus membranes.  Trachea midline, no masses. Cardiovascular: No clubbing, cyanosis, or edema. Respiratory: Normal respiratory effort, no increased work of breathing. GI: Abdomen is soft, non tender, non distended, no abdominal masses. Liver and spleen not palpable.  No hernias appreciated.  Stool sample for occult testing is not indicated.   GU: No CVA tenderness.  No bladder fullness or masses.  Patient with uncircumcised phallus.  Foreskin easily retracted  Urethral meatus is patent.  No penile discharge. No penile lesions or rashes. Scrotum without lesions, cysts, rashes and/or edema.  Testicles are located scrotally bilaterally. No masses are appreciated in the  testicles. Left and right epididymis are normal. Rectal: Patient with  normal sphincter tone. Anus and perineum without scarring or rashes. No rectal masses are appreciated. Prostate is approximately 60 + grams, could only palpated apex and midportion of the gland, no nodules are appreciated. Seminal vesicles could not be palpated.   Skin: No rashes, bruises or suspicious lesions. Lymph: No inguinal adenopathy. Neurologic: Grossly intact, no focal deficits, moving all 4 extremities. Psychiatric: Normal mood and affect.  Laboratory Data: PSA History:  3.6 ng/mL on 05/22/2014  2.6 ng/mL on 12/02/2015  3.0 ng/mL on 12/01/2016  3.0 ng/mL on 11/25/2017  Pending 12/02/2018 I have reviewed the labs   Assessment & Plan:    1. BPH with LUTS IPSS score is 7/1, it is worsening  Continue conservative management, avoiding bladder irritants and timed voiding's Continue tamsulosin 0.4 mg daily, refill given today Was on finasteride but could not tolerate the side effects RTC in 12 months for IPSS, PSA and exam    2. Erectile dysfunction:  SHIM score 5, it is worse failed PDE5-inhibitors Discussed intercavernousal injections, but he was not interested at this time  Return in about 1 year (around 12/02/2019) for IPSS, PSA and exam.  Zara Council, [facility], [facility]  [place] [address] [place], [place] [address] [phone]

## 2018-12-02 ENCOUNTER — Ambulatory Visit: Payer: Medicare Other | Admitting: Urology

## 2018-12-02 ENCOUNTER — Encounter: Payer: Self-pay | Admitting: Urology

## 2018-12-02 VITALS — BP 172/100 | HR 71 | Ht 71.0 in | Wt 250.0 lb

## 2018-12-02 DIAGNOSIS — R351 Nocturia: Secondary | ICD-10-CM | POA: Diagnosis not present

## 2018-12-02 DIAGNOSIS — N138 Other obstructive and reflux uropathy: Secondary | ICD-10-CM | POA: Diagnosis not present

## 2018-12-02 DIAGNOSIS — N529 Male erectile dysfunction, unspecified: Secondary | ICD-10-CM | POA: Diagnosis not present

## 2018-12-02 DIAGNOSIS — N401 Enlarged prostate with lower urinary tract symptoms: Secondary | ICD-10-CM

## 2018-12-02 MED ORDER — TAMSULOSIN HCL 0.4 MG PO CAPS: 0.4000 mg | ORAL_CAPSULE | Freq: Every day | ORAL | 3 refills | Status: DC

## 2018-12-04 LAB — PSA: Prostate Specific Ag, Serum: 2.3 ng/mL (ref 0.0–4.0)

## 2018-12-05 ENCOUNTER — Telehealth: Payer: Self-pay

## 2018-12-05 NOTE — Telephone Encounter (Signed)
-----   Message from Nori Riis, PA-C sent at 12/05/2018  7:26 AM EDT ----- Please let Fred Anderson know that his PSA was stable at 2.3.  We will see him next year.

## 2018-12-05 NOTE — Telephone Encounter (Signed)
Pt called office and I read message from [facility].

## 2018-12-05 NOTE — Telephone Encounter (Signed)
Called pt, wife answers. Unable to give her results as no DPR is on file. She states she will have pt call back.

## 2019-02-01 ENCOUNTER — Emergency Department
Admission: EM | Admit: 2019-02-01 | Discharge: 2019-02-01 | Disposition: A | Discharge status: Home / Self Care | Payer: Medicare Other | Attending: Emergency Medicine | Admitting: Emergency Medicine

## 2019-02-01 ENCOUNTER — Emergency Department: Payer: Medicare Other

## 2019-02-01 ENCOUNTER — Other Ambulatory Visit: Payer: Self-pay

## 2019-02-01 DIAGNOSIS — E119 Type 2 diabetes mellitus without complications: Secondary | ICD-10-CM | POA: Insufficient documentation

## 2019-02-01 DIAGNOSIS — I10 Essential (primary) hypertension: Secondary | ICD-10-CM | POA: Diagnosis not present

## 2019-02-01 DIAGNOSIS — Y939 Activity, unspecified: Secondary | ICD-10-CM | POA: Insufficient documentation

## 2019-02-01 DIAGNOSIS — Z7982 Long term (current) use of aspirin: Secondary | ICD-10-CM | POA: Diagnosis not present

## 2019-02-01 DIAGNOSIS — Y999 Unspecified external cause status: Secondary | ICD-10-CM | POA: Insufficient documentation

## 2019-02-01 DIAGNOSIS — Y929 Unspecified place or not applicable: Secondary | ICD-10-CM | POA: Diagnosis not present

## 2019-02-01 DIAGNOSIS — S40011A Contusion of right shoulder, initial encounter: Secondary | ICD-10-CM | POA: Diagnosis not present

## 2019-02-01 DIAGNOSIS — Z79899 Other long term (current) drug therapy: Secondary | ICD-10-CM | POA: Insufficient documentation

## 2019-02-01 DIAGNOSIS — F17200 Nicotine dependence, unspecified, uncomplicated: Secondary | ICD-10-CM | POA: Diagnosis not present

## 2019-02-01 DIAGNOSIS — W010XXA Fall on same level from slipping, tripping and stumbling without subsequent striking against object, initial encounter: Secondary | ICD-10-CM | POA: Insufficient documentation

## 2019-02-01 DIAGNOSIS — S4991XA Unspecified injury of right shoulder and upper arm, initial encounter: Secondary | ICD-10-CM | POA: Diagnosis present

## 2019-02-01 MED ORDER — METHYLPREDNISOLONE 4 MG PO TBPK: ORAL_TABLET | ORAL | 0 refills | Status: DC

## 2019-02-01 MED ORDER — TRAMADOL HCL 50 MG PO TABS: 50.0000 mg | ORAL_TABLET | Freq: Four times a day (QID) | ORAL | 0 refills | Status: DC | PRN

## 2019-02-01 NOTE — ED Triage Notes (Signed)
Pt fell Monday night and hit his right shoulder. C/o should not being able to move well. Did not hit head. No other complaints. Pt fall was mechanical.

## 2019-02-01 NOTE — Discharge Instructions (Addendum)
Advised to wear arm sling for 3 to 5 days while awake.  Follow discharge care instruction take medication as directed.

## 2019-02-01 NOTE — ED Provider Notes (Signed)
[facility] Emergency Department Provider Note   ____________________________________________   First MD Initiated Contact with Patient 02/01/19 1118     (approximate)  I have reviewed the triage vital signs and the nursing notes.   HISTORY  Chief Complaint Fall    HPI Fred Anderson is a 73 y.o. male patient states right shoulder pain secondary to a slip and fall 2 days ago.  Patient denies LOC or head injury.  Patient rates pain as a 7/10.  Patient described the pain as "achy".  Patient did pain increased with adduction overhead reaching.  No palliative measure for complaint.      Past Medical History:  Diagnosis Date  . Arthritis   . Blood in semen   . BPH (benign prostatic hypertrophy)   . Cancer ([place])    melanoma on back  . DDD (degenerative disc disease), lumbar   . Diabetes mellitus without complication ([place])    TYPE 2  . Erectile dysfunction   . Hematospermia   . Hemorrhoids   . History of adenomatous polyp of colon   . History of kidney stones   . Hyperlipidemia   . Hypertension   . Kidney stones   . Obesity   . 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[place])   . Prediabetes     Patient Active Problem List   Diagnosis Date Noted  . Acute appendicitis with localized peritonitis 04/25/2018  . DDD (degenerative disc disease), lumbar 11/29/2017  . Fecal smearing 03/18/2017  . Family history of colon cancer 12/15/2016  . History of adenomatous polyp of colon 09/18/2016  . High risk medication use 09/14/2016  . Diabetic peripheral neuropathy associated with type 2 diabetes mellitus ([place]) 09/05/2015  . Erectile dysfunction of organic origin 11/29/2014  . BPH with obstruction/lower urinary tract symptoms 09/02/2014  . Hematospermia 09/02/2014  . HLD (hyperlipidemia) 02/05/2014  . Borderline diabetes 02/05/2014  . Combined hyperlipidemia associated with type 2 diabetes mellitus ([place]) 02/05/2014    Past Surgical  History:  Procedure Laterality Date  . BACK SURGERY  age 56   Broken back  . COLONOSCOPY WITH PROPOFOL N/A 05/05/2017   Procedure: COLONOSCOPY WITH PROPOFOL;  Surgeon: Toledo, Benay Pike, MD;  Location: [facility] ENDOSCOPY;  Service: Gastroenterology;  Laterality: N/A;  . HEMORRHOID SURGERY    . LAPAROSCOPIC APPENDECTOMY N/A 04/25/2018   Procedure: APPENDECTOMY LAPAROSCOPIC;  Surgeon: Herbert Pun, MD;  Location: [facility] ORS;  Service: General;  Laterality: N/A;  . SMALL INTESTINE SURGERY      Prior to Admission medications   Medication Sig Start Date End Date Taking? Authorizing Provider  aspirin 325 MG tablet Take 325 mg by mouth daily.    [provider]  lovastatin (MEVACOR) 40 MG tablet Take 40 mg by mouth at bedtime.  10/03/17   [provider]  methylPREDNISolone (MEDROL DOSEPAK) 4 MG TBPK tablet Take Tapered dose as directed 02/01/19   Sable Feil, PA-C  Omega-3 Fatty Acids (FISH OIL) 500 MG CAPS Take 1,000 mg by mouth 2 (two) times daily.     [provider]  tadalafil (CIALIS) 5 MG tablet Take 1 tablet (5 mg total) by mouth daily as needed for erectile dysfunction. 11/29/17   Zara Council A, PA-C  tamsulosin (FLOMAX) 0.4 MG CAPS capsule Take 1 capsule (0.4 mg total) by mouth daily. 12/02/18   McGowan, Larene [place] A, PA-C  traMADol (ULTRAM) 50 MG tablet Take 1 tablet (50 mg total) by mouth every 6 (six) hours as needed for moderate  pain. 02/01/19   Sable Feil, PA-C    Allergies Atorvastatin  Family History  Problem Relation Age of Onset  . Colon cancer Father   . Cancer Mother   . Liver cancer Sister   . Kidney cancer Brother   . Bone cancer Brother   . Kidney disease Neg Hx   . Prostate cancer Neg Hx   . Bladder Cancer Neg Hx     Social History Social History   Tobacco Use  . Smoking status: Former Smoker    Quit date: 08/15/1988    Years since quitting: 30.4  . Smokeless tobacco: Never Used  . Tobacco comment: quit september 11th  1991  Substance Use Topics  . Alcohol use: Yes    Alcohol/week: 12.0 standard drinks    Types: 12 Cans of beer per week  . Drug use: No    Review of Systems Constitutional: No fever/chills Eyes: No visual changes. ENT: No sore throat. Cardiovascular: Denies chest pain. Respiratory: Denies shortness of breath. Gastrointestinal: No abdominal pain.  No nausea, no vomiting.  No diarrhea.  No constipation. Genitourinary: Negative for dysuria. Musculoskeletal: Negative for back pain. Skin: Negative for rash. Neurological: Negative for headaches, focal weakness or numbness. Endocrine:  Hyperlipidemia and hypertension Allergic/Immunilogical: Atorvastatin  ____________________________________________   PHYSICAL EXAM:  VITAL SIGNS: ED Triage Vitals  Enc Vitals Group     BP 02/01/19 1103 (!) 147/95     Pulse Rate 02/01/19 1103 (!) 104     Resp 02/01/19 1108 16     Temp 02/01/19 1111 98.3 F (36.8 C)     Temp Source 02/01/19 1111 Oral     SpO2 02/01/19 1103 94 %     Weight 02/01/19 1106 255 lb (115.7 kg)     Height 02/01/19 1106 5\' 11"  (1.803 m)     Head Circumference --      Peak Flow --      Pain Score 02/01/19 1105 7     Pain Loc --      Pain Edu? --      Excl. in [place]? --     Constitutional: Alert and oriented. Well appearing and in no acute distress. Cardiovascular: Normal rate, regular rhythm. Grossly normal heart sounds.  Good peripheral circulation. Respiratory: Normal respiratory effort.  No retractions. Lungs CTAB. Musculoskeletal: No obvious deformity of the right shoulder.  Patient decreased range of motion with adduction and overhead reaching.   Neurologic:  Normal speech and language. No gross focal neurologic deficits are appreciated. No gait instability. Skin:  Skin is warm, dry and intact. No rash noted.  No abrasion or ecchymosis. Psychiatric: Mood and affect are normal. Speech and behavior are normal.  ____________________________________________    LABS (all labs ordered are listed, but only abnormal results are displayed)  Labs Reviewed - No data to display ____________________________________________  EKG   ____________________________________________  RADIOLOGY  ED MD interpretation:    Official radiology report(s): Dg Shoulder Right  Result Date: 02/01/2019 CLINICAL DATA:  Fall, pain, decreased range of motion EXAM: RIGHT SHOULDER - 2+ VIEW COMPARISON:  None. FINDINGS: No fracture or dislocation of the right shoulder. 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 The partially imaged right chest is unremarkable. IMPRESSION: No fracture or dislocation of the right shoulder. 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 Electronically Signed   By: Eddie Candle M.D.   On: 02/01/2019 11:39    ____________________________________________   PROCEDURES  Procedure(s) performed (including Critical Care):  Procedures   ____________________________________________  INITIAL IMPRESSION / ASSESSMENT AND PLAN / ED COURSE  As part of my medical decision making, I reviewed the following data within the [place]      Patient presents with 2 days of right shoulder pain secondary to mechanical fall.  Physical exam was unremarkable except for decreased range of motion guarding at the [facility] joint.  Discussed x-ray findings with patient.  Patient placed in arm sling and given discharge instructions.  Take medication as directed.  Follow-up PCP.   Fred Anderson was evaluated in Emergency Department on 02/01/2019 for the symptoms described in the history of present illness. He was evaluated in the context of the global COVID-19 pandemic, which necessitated consideration that the patient might be at risk for infection with the SARS-CoV-2 virus that causes COVID-19. Institutional protocols and algorithms that pertain to the evaluation of patients at risk for COVID-19 are  in a state of rapid change based on information released by regulatory bodies including the CDC and federal and state organizations. These policies and algorithms were followed during the patient's care in the ED.       ____________________________________________   FINAL CLINICAL IMPRESSION(S) / ED DIAGNOSES  Final diagnoses:  Contusion of right shoulder, initial encounter     ED Discharge Orders         Ordered    traMADol (ULTRAM) 50 MG tablet  Every 6 hours PRN     02/01/19 1154    methylPREDNISolone (MEDROL DOSEPAK) 4 MG TBPK tablet     02/01/19 1154           Note:  This document was prepared using Dragon voice recognition software and may include unintentional dictation errors.    Sable Feil, PA-C 02/01/19 1157    Duffy [place], MD 02/02/19 [phone]

## 2019-03-01 ENCOUNTER — Other Ambulatory Visit: Payer: Self-pay | Admitting: Orthopedic Surgery

## 2019-03-01 DIAGNOSIS — M25311 Other instability, right shoulder: Secondary | ICD-10-CM

## 2019-03-01 DIAGNOSIS — S46001A Unspecified injury of muscle(s) and tendon(s) of the rotator cuff of right shoulder, initial encounter: Secondary | ICD-10-CM

## 2019-03-14 ENCOUNTER — Other Ambulatory Visit: Payer: Self-pay

## 2019-03-14 ENCOUNTER — Ambulatory Visit
Admission: RE | Admit: 2019-03-14 | Discharge: 2019-03-14 | Disposition: A | Discharge status: Home / Self Care | Payer: Medicare Other | Source: Ambulatory Visit | Attending: Orthopedic Surgery | Admitting: Orthopedic Surgery

## 2019-03-14 DIAGNOSIS — S46001A Unspecified injury of muscle(s) and tendon(s) of the rotator cuff of right shoulder, initial encounter: Secondary | ICD-10-CM | POA: Diagnosis not present

## 2019-03-14 DIAGNOSIS — M25311 Other instability, right shoulder: Secondary | ICD-10-CM | POA: Insufficient documentation

## 2019-03-24 ENCOUNTER — Other Ambulatory Visit: Payer: Self-pay | Admitting: Orthopedic Surgery

## 2019-03-29 ENCOUNTER — Encounter
Admission: RE | Admit: 2019-03-29 | Discharge: 2019-03-29 | Disposition: A | Discharge status: Home / Self Care | Payer: Medicare Other | Source: Ambulatory Visit | Attending: Orthopedic Surgery | Admitting: Orthopedic Surgery

## 2019-03-29 ENCOUNTER — Other Ambulatory Visit: Payer: Self-pay

## 2019-03-29 NOTE — Patient Instructions (Addendum)
Your EKG and COVID test are scheduled on: Thursday 03/30/19 @ 9:00 am.  Leisure centre manager through [employer] main entrance.  Your procedure is scheduled on: Monday 04/03/19.  Report to Same Day Surgery 2nd floor [facility] Entrance-take elevator on left to 2nd floor.  Check in with surgery information desk.) To find out your arrival time, call [phone] 1:00-3:00 PM on Friday 03/31/19  Remember: Instructions that are not followed completely may result in serious medical risk, up to and including death, or upon the discretion of your surgeon and anesthesiologist your surgery may need to be rescheduled.    __x__ 1. Do not eat food (including mints, candies, chewing gum) after midnight the night before your procedure. You may drink WATER up to 2 hours before you are scheduled to arrive at the hospital for your procedure.  Do not drink anything within 2 hours of your scheduled arrival to the hospital.   __x__ 2. Finish your provided Gatorade low sugar drink (in your bag) 2 hours before your scheduled arrival time on the day of surgery.   __x__ 3. No Alcohol or smoking for 24 hours before or after surgery.   __x__ 4. Notify your doctor if there is any change in your medical condition (cold, fever, infections).   __x__ 5. On the morning of surgery brush your teeth with toothpaste and water.  You may rinse your mouth with mouthwash if you wish.  Do not swallow any toothpaste or mouthwash.  Please read over the following fact sheets that you were given:   [facility] Preparing for Surgery and/or MRSA Information    __x__ Use CHG Soap as directed on instruction sheet.   Do not wear jewelry, lotions, powders, deodorant or perfumes on the day of surgery.  Do not shave below the neckline in the 48 hours prior to surgery.   Do not bring valuables to the hospital.  [facility] is not responsible for any belongings or valuables.               Contacts, dentures or bridgework may not be worn into  surgery.  For patients discharged on the day of surgery, you will NOT be permitted to drive yourself home.  You must have a responsible adult with you for 24 hours after surgery.  __x__ Take these medicines on the morning of surgery with a SMALL SIP OF WATER:  1. NONE  __x__ Stop Metformin 2 days before surgery (Last dose Friday 03/31/19).    __x__ STARTING TODAY UNTIL AFTER SURGERY: Do not take any Anti-inflammatories such as Aspirin, Aleve, Naproxen, BC/Goodies powders, Advil, Ibuprofen, Motrin,  Naprosyn. You CAN take Tylenol if needed.   __x__ STARTING TODAY UNTIL AFTER SURGERY: Do not take any over the counter herbal/nutritional supplements (Fish Oil/Omega-3).   __x__ Dennis Bast can take your other medications as normal unless mentioned above.  __x__ If you are prescribed any blood thinners, follow recommendations from your doctor about stopping Aspirin, Coumadin, Plavix, Eliquis, Effient, Pradaxa, and Pletal.

## 2019-03-30 ENCOUNTER — Encounter
Admission: RE | Admit: 2019-03-30 | Discharge: 2019-03-30 | Disposition: A | Discharge status: Home / Self Care | Payer: Medicare Other | Source: Ambulatory Visit | Attending: Orthopedic Surgery | Admitting: Orthopedic Surgery

## 2019-03-30 ENCOUNTER — Other Ambulatory Visit: Payer: Medicare Other

## 2019-03-30 DIAGNOSIS — Z01818 Encounter for other preprocedural examination: Secondary | ICD-10-CM | POA: Diagnosis not present

## 2019-03-30 DIAGNOSIS — Z20822 Contact with and (suspected) exposure to covid-19: Secondary | ICD-10-CM | POA: Insufficient documentation

## 2019-03-30 LAB — BASIC METABOLIC PANEL
Anion gap: 9 (ref 5–15)
BUN: 14 mg/dL (ref 8–23)
CO2: 25 mmol/L (ref 22–32)
Calcium: 9.9 mg/dL (ref 8.9–10.3)
Chloride: 106 mmol/L (ref 98–111)
Creatinine, Ser: 0.78 mg/dL (ref 0.61–1.24)
GFR calc Af Amer: >60 mL/min (ref >60)
GFR calc non Af Amer: >60 mL/min (ref >60)
Glucose, Bld: 154 mg/dL — ABNORMAL HIGH (ref 70–99)
Potassium: 4.2 mmol/L (ref 3.5–5.1)
Sodium: 140 mmol/L (ref 135–145)

## 2019-03-30 LAB — SARS CORONAVIRUS 2 (TAT 6-24 HRS): SARS Coronavirus 2: NEGATIVE

## 2019-03-30 LAB — CBC
HCT: 47.9 % (ref 39.0–52.0)
Hemoglobin: 16.2 g/dL (ref 13.0–17.0)
MCH: 32.5 pg (ref 26.0–34.0)
MCHC: 33.8 g/dL (ref 30.0–36.0)
MCV: 96.2 fL (ref 80.0–100.0)
Platelets: 165 10*3/uL (ref 150–400)
RBC: 4.98 MIL/uL (ref 4.22–5.81)
RDW: 13.1 % (ref 11.5–15.5)
WBC: 5.6 10*3/uL (ref 4.0–10.5)
nRBC: 0 % (ref 0.0–0.2)

## 2019-04-03 ENCOUNTER — Ambulatory Visit
Admission: RE | Admit: 2019-04-03 | Discharge: 2019-04-03 | Disposition: A | Discharge status: Home / Self Care | Payer: Medicare Other | Attending: Orthopedic Surgery | Admitting: Orthopedic Surgery

## 2019-04-03 ENCOUNTER — Ambulatory Visit: Payer: Medicare Other | Admitting: Anesthesiology

## 2019-04-03 ENCOUNTER — Encounter: Payer: Self-pay | Admitting: Orthopedic Surgery

## 2019-04-03 ENCOUNTER — Other Ambulatory Visit: Payer: Self-pay

## 2019-04-03 ENCOUNTER — Encounter
Admission: RE | Disposition: A | Discharge status: Home / Self Care | Payer: Self-pay | Source: Home / Self Care | Attending: Orthopedic Surgery

## 2019-04-03 ENCOUNTER — Ambulatory Visit: Payer: Medicare Other

## 2019-04-03 DIAGNOSIS — M65811 Other synovitis and tenosynovitis, right shoulder: Secondary | ICD-10-CM | POA: Insufficient documentation

## 2019-04-03 DIAGNOSIS — E1142 Type 2 diabetes mellitus with diabetic polyneuropathy: Secondary | ICD-10-CM | POA: Insufficient documentation

## 2019-04-03 DIAGNOSIS — Z85820 Personal history of malignant melanoma of skin: Secondary | ICD-10-CM | POA: Diagnosis not present

## 2019-04-03 DIAGNOSIS — Z79899 Other long term (current) drug therapy: Secondary | ICD-10-CM | POA: Diagnosis not present

## 2019-04-03 DIAGNOSIS — Z9889 Other specified postprocedural states: Secondary | ICD-10-CM | POA: Insufficient documentation

## 2019-04-03 DIAGNOSIS — M25811 Other specified joint disorders, right shoulder: Secondary | ICD-10-CM | POA: Insufficient documentation

## 2019-04-03 DIAGNOSIS — W19XXXA Unspecified fall, initial encounter: Secondary | ICD-10-CM | POA: Insufficient documentation

## 2019-04-03 DIAGNOSIS — I10 Essential (primary) hypertension: Secondary | ICD-10-CM | POA: Diagnosis not present

## 2019-04-03 DIAGNOSIS — Z7982 Long term (current) use of aspirin: Secondary | ICD-10-CM | POA: Insufficient documentation

## 2019-04-03 DIAGNOSIS — N529 Male erectile dysfunction, unspecified: Secondary | ICD-10-CM | POA: Diagnosis not present

## 2019-04-03 DIAGNOSIS — Z6834 Body mass index (BMI) 34.0-34.9, adult: Secondary | ICD-10-CM | POA: Insufficient documentation

## 2019-04-03 DIAGNOSIS — S46011A Strain of muscle(s) and tendon(s) of the rotator cuff of right shoulder, initial encounter: Secondary | ICD-10-CM | POA: Diagnosis present

## 2019-04-03 DIAGNOSIS — Z419 Encounter for procedure for purposes other than remedying health state, unspecified: Secondary | ICD-10-CM

## 2019-04-03 DIAGNOSIS — E785 Hyperlipidemia, unspecified: Secondary | ICD-10-CM | POA: Insufficient documentation

## 2019-04-03 DIAGNOSIS — M19011 Primary osteoarthritis, right shoulder: Secondary | ICD-10-CM | POA: Insufficient documentation

## 2019-04-03 DIAGNOSIS — E669 Obesity, unspecified: Secondary | ICD-10-CM | POA: Insufficient documentation

## 2019-04-03 DIAGNOSIS — Z87891 Personal history of nicotine dependence: Secondary | ICD-10-CM | POA: Insufficient documentation

## 2019-04-03 DIAGNOSIS — M7521 Bicipital tendinitis, right shoulder: Secondary | ICD-10-CM | POA: Insufficient documentation

## 2019-04-03 DIAGNOSIS — Z7984 Long term (current) use of oral hypoglycemic drugs: Secondary | ICD-10-CM | POA: Diagnosis not present

## 2019-04-03 HISTORY — PX: SHOULDER ARTHROSCOPY WITH OPEN ROTATOR CUFF REPAIR: SHX6092

## 2019-04-03 LAB — GLUCOSE, CAPILLARY
Glucose-Capillary: 164 mg/dL — ABNORMAL HIGH (ref 70–99)
Glucose-Capillary: 201 mg/dL — ABNORMAL HIGH (ref 70–99)

## 2019-04-03 SURGERY — ARTHROSCOPY, SHOULDER WITH REPAIR, ROTATOR CUFF, OPEN
Anesthesia: General | Site: Shoulder | Laterality: Right

## 2019-04-03 MED ORDER — FAMOTIDINE 20 MG PO TABS
ORAL_TABLET | ORAL | Status: AC
  Administered 2019-04-03: 20 mg via ORAL
  Filled 2019-04-03: qty 1

## 2019-04-03 MED ORDER — DEXAMETHASONE SODIUM PHOSPHATE 10 MG/ML IJ SOLN
INTRAMUSCULAR | Status: DC | PRN
  Administered 2019-04-03: 10 mg via INTRAVENOUS

## 2019-04-03 MED ORDER — FENTANYL CITRATE (PF) 100 MCG/2ML IJ SOLN: 25.0000 ug | INTRAMUSCULAR | Status: DC | PRN

## 2019-04-03 MED ORDER — FENTANYL CITRATE (PF) 250 MCG/5ML IJ SOLN
INTRAMUSCULAR | Status: AC
  Filled 2019-04-03: qty 5

## 2019-04-03 MED ORDER — SODIUM CHLORIDE (PF) 0.9 % IJ SOLN
INTRAMUSCULAR | Status: AC
  Filled 2019-04-03: qty 10

## 2019-04-03 MED ORDER — LIDOCAINE HCL 4 % MT SOLN
OROMUCOSAL | Status: DC | PRN
  Administered 2019-04-03: 4 mL via TOPICAL

## 2019-04-03 MED ORDER — BUPIVACAINE HCL (PF) 0.5 % IJ SOLN
INTRAMUSCULAR | Status: DC | PRN
  Administered 2019-04-03: 10 mL via PERINEURAL

## 2019-04-03 MED ORDER — LACTATED RINGERS IV SOLN
INTRAVENOUS | Status: DC | PRN
  Administered 2019-04-03: 07:00:00 4 mL

## 2019-04-03 MED ORDER — ONDANSETRON HCL 4 MG/2ML IJ SOLN
INTRAMUSCULAR | Status: DC | PRN
  Administered 2019-04-03: 4 mg via INTRAVENOUS

## 2019-04-03 MED ORDER — DEXAMETHASONE SODIUM PHOSPHATE 10 MG/ML IJ SOLN
INTRAMUSCULAR | Status: AC
  Filled 2019-04-03: qty 1

## 2019-04-03 MED ORDER — CEFAZOLIN SODIUM-DEXTROSE 2-4 GM/100ML-% IV SOLN
INTRAVENOUS | Status: AC
  Filled 2019-04-03: qty 100

## 2019-04-03 MED ORDER — SODIUM CHLORIDE 0.9 % IV SOLN: INTRAVENOUS | Status: DC

## 2019-04-03 MED ORDER — SODIUM CHLORIDE 0.9 % IV SOLN
INTRAVENOUS | Status: DC | PRN
  Administered 2019-04-03: 40 ug/min via INTRAVENOUS

## 2019-04-03 MED ORDER — EPHEDRINE SULFATE 50 MG/ML IJ SOLN
INTRAMUSCULAR | Status: AC
  Filled 2019-04-03: qty 1

## 2019-04-03 MED ORDER — LACTATED RINGERS IV SOLN: INTRAVENOUS | Status: DC | PRN

## 2019-04-03 MED ORDER — CEFAZOLIN SODIUM-DEXTROSE 2-4 GM/100ML-% IV SOLN
2.0000 g | INTRAVENOUS | Status: AC
  Administered 2019-04-03: 2 g via INTRAVENOUS

## 2019-04-03 MED ORDER — PHENYLEPHRINE HCL (PRESSORS) 10 MG/ML IV SOLN
INTRAVENOUS | Status: AC
  Filled 2019-04-03: qty 1

## 2019-04-03 MED ORDER — EPINEPHRINE PF 1 MG/ML IJ SOLN
INTRAMUSCULAR | Status: AC
  Filled 2019-04-03: qty 4

## 2019-04-03 MED ORDER — LIDOCAINE HCL (CARDIAC) PF 100 MG/5ML IV SOSY
PREFILLED_SYRINGE | INTRAVENOUS | Status: DC | PRN
  Administered 2019-04-03: 60 mg via INTRAVENOUS

## 2019-04-03 MED ORDER — LIDOCAINE HCL (PF) 1 % IJ SOLN
INTRAMUSCULAR | Status: AC
  Filled 2019-04-03: qty 5

## 2019-04-03 MED ORDER — ACETAMINOPHEN 500 MG PO TABS: 1000.0000 mg | ORAL_TABLET | Freq: Three times a day (TID) | ORAL | 2 refills | Status: DC

## 2019-04-03 MED ORDER — BUPIVACAINE HCL (PF) 0.5 % IJ SOLN
INTRAMUSCULAR | Status: AC
  Filled 2019-04-03: qty 10

## 2019-04-03 MED ORDER — LIDOCAINE-EPINEPHRINE 1 %-1:100000 IJ SOLN
INTRAMUSCULAR | Status: AC
  Filled 2019-04-03: qty 1

## 2019-04-03 MED ORDER — ROCURONIUM BROMIDE 100 MG/10ML IV SOLN
INTRAVENOUS | Status: DC | PRN
  Administered 2019-04-03: 50 mg via INTRAVENOUS

## 2019-04-03 MED ORDER — ONDANSETRON HCL 4 MG/2ML IJ SOLN
INTRAMUSCULAR | Status: AC
  Filled 2019-04-03: qty 2

## 2019-04-03 MED ORDER — OXYCODONE HCL 5 MG PO TABS: 5.0000 mg | ORAL_TABLET | ORAL | 0 refills | Status: DC | PRN

## 2019-04-03 MED ORDER — CHLORHEXIDINE GLUCONATE 4 % EX LIQD: 60.0000 mL | Freq: Once | CUTANEOUS | Status: DC

## 2019-04-03 MED ORDER — PHENYLEPHRINE HCL (PRESSORS) 10 MG/ML IV SOLN
INTRAVENOUS | Status: DC | PRN
  Administered 2019-04-03 (×3): 100 ug via INTRAVENOUS

## 2019-04-03 MED ORDER — EPHEDRINE SULFATE 50 MG/ML IJ SOLN
INTRAMUSCULAR | Status: DC | PRN
  Administered 2019-04-03: 10 mg via INTRAVENOUS

## 2019-04-03 MED ORDER — ROCURONIUM BROMIDE 50 MG/5ML IV SOLN
INTRAVENOUS | Status: AC
  Filled 2019-04-03: qty 1

## 2019-04-03 MED ORDER — ONDANSETRON 4 MG PO TBDP: 4.0000 mg | ORAL_TABLET | Freq: Three times a day (TID) | ORAL | 0 refills | Status: DC | PRN

## 2019-04-03 MED ORDER — MIDAZOLAM HCL 2 MG/2ML IJ SOLN: 2.0000 mg | Freq: Once | INTRAMUSCULAR | Status: AC

## 2019-04-03 MED ORDER — LIDOCAINE HCL (PF) 1 % IJ SOLN
INTRAMUSCULAR | Status: DC | PRN
  Administered 2019-04-03: 3 mL via SUBCUTANEOUS

## 2019-04-03 MED ORDER — FAMOTIDINE 20 MG PO TABS: 20.0000 mg | ORAL_TABLET | Freq: Once | ORAL | Status: AC

## 2019-04-03 MED ORDER — BUPIVACAINE LIPOSOME 1.3 % IJ SUSP
INTRAMUSCULAR | Status: AC
  Filled 2019-04-03: qty 20

## 2019-04-03 MED ORDER — FENTANYL CITRATE (PF) 100 MCG/2ML IJ SOLN
INTRAMUSCULAR | Status: DC | PRN
  Administered 2019-04-03: 50 ug via INTRAVENOUS
  Administered 2019-04-03: 25 ug via INTRAVENOUS
  Administered 2019-04-03 (×2): 50 ug via INTRAVENOUS
  Administered 2019-04-03: 75 ug via INTRAVENOUS

## 2019-04-03 MED ORDER — PROPOFOL 10 MG/ML IV BOLUS
INTRAVENOUS | Status: DC | PRN
  Administered 2019-04-03: 130 mg via INTRAVENOUS

## 2019-04-03 MED ORDER — PROPOFOL 10 MG/ML IV BOLUS
INTRAVENOUS | Status: AC
  Filled 2019-04-03: qty 20

## 2019-04-03 MED ORDER — LIDOCAINE HCL (PF) 2 % IJ SOLN
INTRAMUSCULAR | Status: AC
  Filled 2019-04-03: qty 5

## 2019-04-03 MED ORDER — MIDAZOLAM HCL 2 MG/2ML IJ SOLN
INTRAMUSCULAR | Status: AC
  Administered 2019-04-03: 2 mg via INTRAVENOUS
  Filled 2019-04-03: qty 2

## 2019-04-03 MED ORDER — ASPIRIN EC 325 MG PO TBEC: 325.0000 mg | DELAYED_RELEASE_TABLET | Freq: Every day | ORAL | 0 refills | Status: AC

## 2019-04-03 MED ORDER — ONDANSETRON HCL 4 MG/2ML IJ SOLN: 4.0000 mg | Freq: Once | INTRAMUSCULAR | Status: DC | PRN

## 2019-04-03 MED ORDER — BUPIVACAINE LIPOSOME 1.3 % IJ SUSP
INTRAMUSCULAR | Status: DC | PRN
  Administered 2019-04-03: 20 mL

## 2019-04-03 SURGICAL SUPPLY — 83 items
ADAPTER IRRIG TUBE 2 SPIKE SOL (ADAPTER) ×6 IMPLANT
ANCHOR 2.3 SP SGL 1.2 XBRAID (Anchor) ×6 IMPLANT
ANCHOR 2.3MM SP SGL 1.2 XBRAID (Anchor) ×3 IMPLANT
ANCHOR SUT BIO SW 4.75X19.1 (Anchor) ×6 IMPLANT
BLADE OSCILLATING/SAGITTAL (BLADE)
BLADE SW THK.38XMED LNG THN (BLADE) IMPLANT
BNDG ADH 2 X3.75 FABRIC TAN LF (GAUZE/BANDAGES/DRESSINGS) ×3 IMPLANT
BUR BR 5.5 12 FLUTE (BURR) ×3 IMPLANT
BUR RADIUS 4.0X18.5 (BURR) IMPLANT
CANNULA 5.75X7CM (CANNULA)
CANNULA PART THRD DISP 5.75X7 (CANNULA) IMPLANT
CANNULA PARTIAL THREAD 2X7 (CANNULA) ×3 IMPLANT
CANNULA TWIST IN 8.25X9CM (CANNULA) IMPLANT
CHLORAPREP W/TINT 26 (MISCELLANEOUS) ×3 IMPLANT
CLOSURE WOUND 1/2 X4 (GAUZE/BANDAGES/DRESSINGS)
COOLER POLAR GLACIER W/PUMP (MISCELLANEOUS) ×3 IMPLANT
COVER LIGHT HANDLE STERIS (MISCELLANEOUS) ×3 IMPLANT
COVER WAND RF STERILE (DRAPES) ×3 IMPLANT
CRADLE LAMINECT ARM (MISCELLANEOUS) ×6 IMPLANT
DERMABOND ADVANCED (GAUZE/BANDAGES/DRESSINGS) ×2
DERMABOND ADVANCED .7 DNX12 (GAUZE/BANDAGES/DRESSINGS) ×1 IMPLANT
DRAPE 3/4 80X56 (DRAPES) ×3 IMPLANT
DRAPE IMP U-DRAPE 54X76 (DRAPES) ×12 IMPLANT
DRAPE INCISE IOBAN 66X45 STRL (DRAPES) ×3 IMPLANT
DRAPE U-SHAPE 47X51 STRL (DRAPES) ×6 IMPLANT
DRSG TEGADERM 4X4.75 (GAUZE/BANDAGES/DRESSINGS) ×9 IMPLANT
ELECT REM PT RETURN 9FT ADLT (ELECTROSURGICAL) ×3
ELECTRODE REM PT RTRN 9FT ADLT (ELECTROSURGICAL) ×1 IMPLANT
GAUZE SPONGE 4X4 12PLY STRL (GAUZE/BANDAGES/DRESSINGS) ×3 IMPLANT
GAUZE XEROFORM 1X8 LF (GAUZE/BANDAGES/DRESSINGS) ×3 IMPLANT
GLOVE BIOGEL PI IND STRL 8 (GLOVE) ×1 IMPLANT
GLOVE BIOGEL PI INDICATOR 8 (GLOVE) ×2
GLOVE SURG SYN 8.0 (GLOVE) ×6 IMPLANT
GOWN STRL REUS W/ TWL LRG LVL3 (GOWN DISPOSABLE) ×2 IMPLANT
GOWN STRL REUS W/TWL LRG LVL3 (GOWN DISPOSABLE) ×4
GOWN STRL REUS W/TWL XL LVL4 (GOWN DISPOSABLE) ×3 IMPLANT
IV LACTATED RINGER IRRG 3000ML (IV SOLUTION) ×48
IV LR IRRIG 3000ML ARTHROMATIC (IV SOLUTION) ×24 IMPLANT
KIT STABILIZATION SHOULDER (MISCELLANEOUS) ×3 IMPLANT
KIT TURNOVER KIT A (KITS) ×3 IMPLANT
MANIFOLD NEPTUNE II (INSTRUMENTS) ×3 IMPLANT
MASK FACE SPIDER DISP (MASK) ×3 IMPLANT
MAT ABSORB  FLUID 56X50 GRAY (MISCELLANEOUS) ×4
MAT ABSORB FLUID 56X50 GRAY (MISCELLANEOUS) ×2 IMPLANT
NDL SAFETY ECLIPSE 18X1.5 (NEEDLE) ×1 IMPLANT
NEEDLE HYPO 18GX1.5 SHARP (NEEDLE) ×2
NEEDLE HYPO 22GX1.5 SAFETY (NEEDLE) ×3 IMPLANT
NEEDLE MAYO 6 CRC TAPER PT (NEEDLE) IMPLANT
NEEDLE SCORPION MULTI FIRE (NEEDLE) ×9 IMPLANT
PACK ARTHROSCOPY SHOULDER (MISCELLANEOUS) ×3 IMPLANT
PAD ABD DERMACEA PRESS 5X9 (GAUZE/BANDAGES/DRESSINGS) ×3 IMPLANT
PAD WRAPON POLAR SHDR XLG (MISCELLANEOUS) ×1 IMPLANT
PASSER SUT 70D UP ANGLED (INSTRUMENTS) ×3 IMPLANT
PENCIL SMOKE ULTRAEVAC 22 CON (MISCELLANEOUS) ×3 IMPLANT
SET TUBE SUCT SHAVER OUTFL 24K (TUBING) ×3 IMPLANT
SET TUBE TIP INTRA-ARTICULAR (MISCELLANEOUS) ×3 IMPLANT
SLING ULTRA II M (MISCELLANEOUS) ×3 IMPLANT
SPONGE LAP 18X18 RF (DISPOSABLE) ×3 IMPLANT
STAPLER SKIN PROX 35W (STAPLE) IMPLANT
STRAP SAFETY 5IN WIDE (MISCELLANEOUS) ×3 IMPLANT
STRIP CLOSURE SKIN 1/2X4 (GAUZE/BANDAGES/DRESSINGS) IMPLANT
SUT ETHILON 3-0 (SUTURE) IMPLANT
SUT ETHILON 4-0 (SUTURE) ×2
SUT ETHILON 4-0 FS2 18XMFL BLK (SUTURE) ×1
SUT LASSO 90 DEG SD STR (SUTURE) IMPLANT
SUT PROLENE 0 CT 2 (SUTURE) IMPLANT
SUT TICRON 2-0 30IN 311381 (SUTURE) IMPLANT
SUT VIC AB 0 CT1 36 (SUTURE) ×3 IMPLANT
SUT VIC AB 2-0 CT2 27 (SUTURE) ×3 IMPLANT
SUT VICRYL 3-0 27IN (SUTURE) IMPLANT
SUTURE ETHLN 4-0 FS2 18XMF BLK (SUTURE) ×1 IMPLANT
SUTURE TAPE 1.3 40 TPR END (SUTURE) ×5 IMPLANT
SUTURETAPE 1.3 40 TPR END (SUTURE) ×15
SYR 10ML LL (SYRINGE) IMPLANT
SYSTEM ANCHOR SUT KNTLESS 4.75 (Anchor) ×9 IMPLANT
SYSTEM FBRTK BICEPS 1.9 DRILL (Anchor) ×3 IMPLANT
TAPE CLOTH 3X10 WHT NS LF (GAUZE/BANDAGES/DRESSINGS) ×3 IMPLANT
TAPE MICROFOAM 4IN (TAPE) ×3 IMPLANT
TUBING ARTHRO INFLOW-ONLY STRL (TUBING) ×3 IMPLANT
TUBING CONNECTING 10 (TUBING) ×2 IMPLANT
TUBING CONNECTING 10' (TUBING) ×1
WAND WEREWOLF FLOW 90D (MISCELLANEOUS) ×3 IMPLANT
WRAPON POLAR PAD SHDR XLG (MISCELLANEOUS) ×3

## 2019-04-03 NOTE — Anesthesia Procedure Notes (Signed)
Procedure Name: Intubation Date/Time: 04/03/2019 7:41 AM Performed by: Esaw Grandchild, CRNA Pre-anesthesia Checklist: Patient identified, Emergency Drugs available, Suction available and Patient being monitored Patient Re-evaluated:Patient Re-evaluated prior to induction Oxygen Delivery Method: Circle system utilized Preoxygenation: Pre-oxygenation with 100% oxygen Induction Type: IV induction Ventilation: Mask ventilation without difficulty and Oral airway inserted - appropriate to patient size Laryngoscope Size: McGraph and 4 Grade View: Grade II Tube type: Oral Tube size: 8.0 mm Number of attempts: 1 Airway Equipment and Method: Stylet,  Oral airway,  LTA kit utilized,  Bite block and Video-laryngoscopy Placement Confirmation: ETT inserted through vocal cords under direct vision,  positive ETCO2 and breath sounds checked- equal and bilateral Secured at: 24 cm Tube secured with: Tape Dental Injury: Teeth and Oropharynx as per pre-operative assessment

## 2019-04-03 NOTE — Discharge Instructions (Addendum)
Post-Op Instructions - Rotator Cuff Repair  1. Bracing: You will wear a shoulder immobilizer or sling for 6 weeks.   2. Driving: No driving for 3 weeks post-op. When driving, do not wear the immobilizer. Ideally, we recommend no driving for 6 weeks while sling is in place as one arm will be immobilized.   3. Activity: No active lifting for 2 months. Wrist, hand, and elbow motion only. Avoid lifting the upper arm away from the body except for hygiene. You are permitted to bend and straighten the elbow passively only (no active elbow motion). You may use your hand and wrist for typing, writing, and managing utensils (cutting food). Do not lift more than a coffee cup for 8 weeks.  When sleeping or resting, inclined positions (recliner chair or wedge pillow) and a pillow under the forearm for support may provide better comfort for up to 4 weeks.  Avoid long distance travel for 4 weeks.  Return to normal activities after rotator cuff repair repair normally takes 6 months on average. If rehab goes very well, may be able to do most activities at 4 months, except overhead or contact sports.  4. Physical Therapy: Begins 3-4 days after surgery, and proceed 1 time per week for the first 6 weeks, then 1-2 times per week from weeks 6-20 post-op.  5. Medications:  - You will be provided a prescription for narcotic pain medicine. After surgery, take 1-2 narcotic tablets every 4 hours if needed for severe pain.  - A prescription for anti-nausea medication will be provided in case the narcotic medicine causes nausea - take 1 tablet every 6 hours only if nauseated.   - Take tylenol 1000 mg (2 Extra Strength tablets or 3 regular strength) every 8 hours for pain.  May decrease or stop tylenol 5 days after surgery if you are having minimal pain. - Take ASA 325mg /day x 2 weeks to help prevent DVTs/PEs (blood clots).  - DO NOT take ANY nonsteroidal anti-inflammatory pain medications (Advil, Motrin, Ibuprofen, Aleve,  Naproxen, or Naprosyn). These medicines can inhibit healing of your shoulder repair.    If you are taking prescription medication for anxiety, depression, insomnia, muscle spasm, chronic pain, or for attention deficit disorder, you are advised that you are at a higher risk of adverse effects with use of narcotics post-op, including narcotic addiction/dependence, depressed breathing, death. If you use non-prescribed substances: alcohol, marijuana, cocaine, heroin, methamphetamines, etc., you are at a higher risk of adverse effects with use of narcotics post-op, including narcotic addiction/dependence, depressed breathing, death. You are advised that taking > 50 morphine milligram equivalents (MME) of narcotic pain medication per day results in twice the risk of overdose or death. For your prescription provided: oxycodone 5 mg - taking more than 6 tablets per day would result in > 50 morphine milligram equivalents (MME) of narcotic pain medication. Be advised that we will prescribe narcotics short-term, for acute post-operative pain only - 3 weeks for major operations such as shoulder repair/reconstruction surgeries.     6. Post-Op Appointment:  Your first post-op appointment will be 10-14 days post-op.  7. Work or School: For most, but not all procedures, we advise staying out of work or school for at least 1 to 2 weeks in order to recover from the stress of surgery and to allow time for healing.   If you need a work or school note this can be provided.   8. Smoking/Alcohol: If you are a smoker, you need to refrain from  smoking in the postoperative period. The nicotine in cigarettes will inhibit healing of your shoulder repair and decrease the chance of successful repair. Similarly, nicotine containing products (gum, patches) should be avoided.  Additionally, avoid alcohol use in the postoperative period, especially while arm is in sling.  Post-operative Brace: Apply and remove the brace you  received as you were instructed to at the time of fitting and as described in detail as the brace's instructions for use indicate.  Wear the brace for the period of time prescribed by your physician.  The brace can be cleaned with soap and water and allowed to air dry only.  Should the brace result in increased pain, decreased feeling (numbness/tingling), increased swelling or an overall worsening of your medical condition, please contact your doctor immediately.  If an emergency situation occurs as a result of wearing the brace after normal business hours, please dial 911 and seek immediate medical attention.  Let your doctor know if you have any further questions about the brace issued to you. Refer to the shoulder sling instructions for use if you have any questions regarding the correct fit of your shoulder sling.  [facility]bles[facility]ooting: [phone]  Video that illustrates how to properly use a shoulder sling: "Instructions for Proper Use of an Orthopaedic Sling" [email or web address]    AMBULATORY SURGERY  DISCHARGE INSTRUCTIONS   1) The drugs that you were given will stay in your system until tomorrow so for the next 24 hours you should not:  A) Drive an automobile B) Make any legal decisions C) Drink any alcoholic beverage   2) You may resume regular meals tomorrow.  Today it is better to start with liquids and gradually work up to solid foods.  You may eat anything you prefer, but it is better to start with liquids, then soup and crackers, and gradually work up to solid foods.   3) Please notify your doctor immediately if you have any unusual bleeding, trouble breathing, redness and pain at the surgery site, drainage, fever, or pain not relieved by medication.    4) Additional Instructions:        Please contact your physician with any problems or Same Day Surgery at [phone], Monday through Friday 6 am to 4 pm, or [place]  at [facility] number at [phone].

## 2019-04-03 NOTE — Anesthesia Procedure Notes (Signed)
Anesthesia Regional Block: Interscalene brachial plexus block   Pre-Anesthetic Checklist: ,, timeout performed, Correct Patient, Correct Site, Correct Laterality, Correct Procedure, Correct Position, site marked, Risks and benefits discussed,  Surgical consent,  Pre-op evaluation,  At surgeon's request and post-op pain management  Laterality: Left  Prep: chloraprep       Needles:  Injection technique: Single-shot  Needle Type: Echogenic Stimulator Needle     Needle Length: 10cm  Needle Gauge: 20     Additional Needles:   Procedures:, nerve stimulator,,, ultrasound used (permanent image in chart),,,,   Nerve Stimulator or Paresthesia:  Response: biceps flexion,   Additional Responses:   Narrative:  Start time: 04/03/2019 7:20 AM End time: 04/03/2019 7:26 AM Injection made incrementally with aspirations every 5 mL.  Performed by: Personally   Additional Notes: Functioning IV was confirmed and monitors were applied. Sterile prep and drape,hand hygiene and sterile gloves were used.  Negative aspiration and negative test dose prior to incremental administration of local anesthetic. The patient tolerated the procedure well.

## 2019-04-03 NOTE — Op Note (Signed)
SURGERY DATE: 04/03/2019  PRE-OP DIAGNOSIS:  1. Right rotator cuff tear (subscapularis, supraspinatus, infraspinatus) 2. Right subacromial impingement 3. Right biceps tendinopathy  POST-OP DIAGNOSIS: 1. Right rotator cuff tear (subscapularis, supraspinatus, infraspinatus) 2. Right subacromial impingement 3. Right biceps tendinopathy  PROCEDURES:  1. Right arthroscopic rotator cuff repair (subscapularis) 2. Right mini-open rotator cuff repair (supraspinatus, infraspinatus, subscapularis augmentation) 3. Right open biceps tenodesis 4. Right arthroscopic extensive debridement of shoulder (glenohumeral and subacromial spaces) 5. Right arthroscopic subacromial decompression  SURGEON: Cato Mulligan, MD  ASSISTANT: Anitra Lauth, PA  ANESTHESIA: Gen with Exparil interscalene block  ESTIMATED BLOOD LOSS: 25cc  DRAINS:  none  TOTAL IV FLUIDS: per anesthesia   SPECIMENS: none  IMPLANTS:  - Arthrex 4.74mm SwiveLock x 2 - Arthrex FiberTak Suture Anchor - Double Loaded - x1 - Iconix SPEED double loaded with 1.2 and 2.72mm tape x 3 - Stryker Omega 4.24mm anchors - x 3   OPERATIVE FINDINGS:  Examination under anesthesia: A careful examination under anesthesia was performed.  Passive range of motion was: FF: 150; ER at side: 50; ER in abduction: 90; IR in abduction: 50.  Anterior load shift: NT.  Posterior load shift: NT.  Sulcus in neutral: NT.  Sulcus in ER: NT.    Intra-operative findings: A thorough arthroscopic examination of the shoulder was performed.  The findings are: 1. Biceps tendon: Severe tendinopathy with flattening of the biceps tendon with medial subluxation out of the bicipital groove 2. Superior labrum: injected with surrounding synovitis 3. Posterior labrum and capsule: normal 4. Inferior capsule and inferior recess: normal 5. Glenoid cartilage surface: Grade 1 changes  6. Supraspinatus attachment: full-thickness tear  7. Posterior rotator cuff attachment: partial  tear of anterior infraspinatus 8. Humeral head articular cartilage: normal 9. Rotator interval: Severe synovitis 10: Subscapularis tendon: full-thickness tear 11. Anterior labrum: degenerative 12. IGHL: significant synovitis around IGHL  OPERATIVE REPORT:   Indications for procedure: Fred Anderson is a 74 y.o. male with chronic intermittent shoulder pain that significantly worsened after a fall on 01/29/2019. Since that time, he has had difficulty with elevation of his arm over his head and significant pain that is not improved with conservative measures. Clinical exam and MRI were suggestive of rotator cuff tear including subscapularis, supraspinatus, and infraspinatus tears; subacromial impingement; and acromioclavicular joint arthritis. Given the traumatic nature of the tear, full-thickness component of multiple tendons, and the patient's continued symptoms, we decided to proceed with surgical management.  Additionally, on preoperative MRI, there is some fatty atrophy present in both the supraspinatus and subscapularis suggesting some chronicity to his rotator cuff tears. After discussion of risks, benefits, and alternatives to surgery, the patient elected to proceed.   Procedure in detail:  I identified Fred Anderson in the pre-operative holding area.  I marked the operative shoulder with my initials. I reviewed the risks and benefits of the proposed surgical intervention, and the patient (and/or patient's guardian) wished to proceed.  Anesthesia was then performed with an Exparil interscalene block.  The patient was transferred to the operative suite and placed in the beach chair position.    SCDs were placed on the lower extremities. Appropriate IV antibiotics were administered prior to incision. The operative upper extremity was then prepped and draped in standard fashion. A time out was performed confirming the correct extremity, correct patient, and correct procedure.   I then created  a standard posterior portal with an 11 blade. The glenohumeral joint was easily entered with a blunt  trochar and the arthroscope introduced. The findings of diagnostic arthroscopy are described above.  A standard anterior portal was made.  I debrided degenerative tissue including the synovitic tissue about the rotator interval and IGHL as well as the anterior and superior labrum. I then coagulated the inflamed synovium to obtain hemostasis and reduce the risk of post-operative swelling using an Arthrocare radiofrequency device. The biceps tendon was cut at its insertion on the superior labrum with arthroscopic scissors.  The subscapularis tear was identified.  A superior anterolateral portal was made under needle localization.  A 7 mm cannula was placed.  The, comma tissue indicating the superolateral border of the subscapularis was identified readily.  The tip of the coracoid as well as the conjoined tendon and coracoacromial ligaments were visualized after debriding rotator interval tissue.  Tissue about the subscapularis was released anteriorly, superiorly, and posteriorly to allow for improved mobilization.  The comma tissue was tagged with a FiberWire suture and tension on this allowed for appropriate mobilization of the subscapularis.  The lesser tuberosity footprint was prepared using electrocautery.  A combination of a scorpion suture passing device and slingshot device were used to pass 2 SutureTapes in a mattress fashion through the subscapularis tendon.  These were passed from the anteriorl portal.  These were passed through a 4.75 mm SwiveLock and placed into the lesser tuberosity at the footprint of the subscapularis tendon just lateral to the articular margin with the arm in a neutral position.  This appropriately reduced the subscapularis tear.  The arm was then internally and externally rotated and the subscapularis was noted to move appropriately with rotation.  Next, the arthroscope was then  introduced into the subacromial space. A direct lateral portal was created with an 11-blade after spinal needle localization. An extensive subacromial bursectomy was performed using a combination of the shaver and Arthrocare wand. The entire acromial undersurface was exposed and the CA ligament was subperiosteally elevated to expose the anterior acromial hook. A 5.8mm barrel burr was used to create a flat anterior and lateral aspect of the acromion, converting it from a Type 2 to a Type 1 acromion. Care was made to keep the deltoid fascia intact.  A longitudinal incision from the anterolateral acromion ~6cm in length was made overlying the raphe between the anterior and middle heads of the deltoid.  This incision also incorporated the anterolateral portal.  The raphe was identified and it was incised. The subacromial space was identified. Any remaining bursa was excised. The rotator cuff tear involving the supraspinatus and part of the infraspinatus was identified. It was an L-shaped tear with the long limb of the L anterior.   We then turned our attention to the biceps tenodesis. The arm was externally rotated.  The bicipital groove was identified.  A 15 blade was used to make a cut overlying the biceps tendon, and the tendon was removed using a right angle clamp.  The base of the bicipital groove was identified and cleared of soft tissue.  A FiberTak anchor was placed in the bicipital groove.  The biceps tendon was held at the appropriate amount of tension.  One set of sutures was passed through the biceps anchor with one limb passed in a simple fashion and the second limb passed in a simple plus locking stitch pattern.  This was repeated for the other set of sutures.  This construct allowed for shuttling the biceps tendon down to the bone.  The sutures were tied and cut.  The diseased  portion of the proximal biceps was then excised.  Additionally, it was noted that the lateral aspect of the subscapularis  adjacent to the bicipital groove was not compressed to the lesser tuberosity footprint.  Therefore 2 additional suture tapes were passed in a mattress fashion through the distal portion of the subscapularis.  All 4 strands were loaded through another 4.75 mm swivel lock anchor.  The lesser tuberosity footprint was cleared of soft tissue and a bleeding bed was achieved with a rongeur.  The anchor was then placed, sutures were tensioned appropriately and then cut.  This appropriately reduced the distal aspect of the subscapularis to the footprint with appropriate compression.  The arm was then internally rotated.  The superior rotator cuff footprint was cleared of soft tissue and the footprint was smoothed and bleeding bone over the footprint was created with a rongeur.  Three Iconix SPEED anchors were placed just lateral to the articular margin.  The rotator cuff was held in a reduced position and all limbs of suture were passed appropriately with a scorpion suture passer. Three Stryker Omega anchors were placed for the lateral row anchors with one limb of each of the four medial row sutures from the posterior and middle anchor passed through the posterior and middle lateral row anchor.  The 4 strands of suture from the anterior anchor were passed through the anterior lateral row anchor.  There was a small dogear and suture from the middle anchor was passed in a mattress fashion and tied.  This allowed for excellent reapproximation and compression of the rotator cuff over its footprint. The construct was stable with external and internal rotation.  The wound was thoroughly irrigated.  The deltoid split was closed with 0 Vicryl.  The subdermal layer was closed with 2-0 Vicryl.  The skin was closed with 4-0 Monocryl and Dermabond. The portals were closed with 3-0 Nylon. Xeroform was applied to the incisions. A sterile dressing was applied, followed by a Polar Care sleeve and a SlingShot shoulder immobilizer/sling.  The patient was awakened from anesthesia without difficulty and was transferred to the PACU in stable condition.   Of note, assistance from a PA was essential to performing the surgery. PA assisted with patient positioning, retraction, and instrumentation. The surgery would have been more difficult and had longer operative time without PA assistance.   Additionally, this mini open rotator cuff repair had more complexity and longer surgical time when compared to standard mini open rotator cuff repair.  This repair utilized 3 Medial Row and 3 lateral row anchors compared to the standard 2 Medial Row and 2 Lateral Row anchors.  Additionally, this repair involved augmenting the arthroscopic subscapularis repair with the mini open approach to achieve better compression of the subscapularis to its footprint, also requiring the use of an additional anchor.  All of these factors extended surgical time by approximately 30 minutes.   COMPLICATIONS: none  DISPOSITION: plan for discharge home after recovery in PACU   POSTOPERATIVE PLAN: Remain in sling (except hygiene and elbow/wrist/hand RoM exercises as instructed by PT) x 6 weeks and NWB for this time. PT to begin 3-4 days after surgery.  Use large rotator cuff repair rehab protocol with subscapularis repair.  ASA 325mg  daily x 2 weeks for DVT ppx.

## 2019-04-03 NOTE — Transfer of Care (Signed)
Immediate Anesthesia Transfer of Care Note  Patient: Fred Anderson  Procedure(s) Performed: SHOULDER ARTHROSCOPY WITH SUBSACAPULARIS REPAIR, OPEN ROTATOR CUFF REPAIR, SUBARCOMIAL DECOMPRESSION, BICEPS TENODESIS (Right Shoulder)  Patient Location: PACU  Anesthesia Type:General and Regional  Level of Consciousness: awake and alert   Airway & Oxygen Therapy: Patient Spontanous Breathing and Patient connected to nasal cannula oxygen  Post-op Assessment: Report given to RN and Post -op Vital signs reviewed and stable  Post vital signs: Reviewed and stable  Last Vitals:  Vitals Value Taken Time  BP 154/82 04/03/19 1123  Temp    Pulse 92 04/03/19 1125  Resp 12 04/03/19 1125  SpO2 95 % 04/03/19 1125  Vitals shown include unvalidated device data.  Last Pain:  Vitals:   04/03/19 0723  TempSrc:   PainSc: 3          Complications: No apparent anesthesia complications

## 2019-04-03 NOTE — Anesthesia Preprocedure Evaluation (Signed)
Anesthesia Evaluation  Patient identified by MRN, date of birth, ID band Patient awake    Reviewed: Allergy & Precautions, H&P , NPO status , Patient's Chart, lab work & pertinent test results, reviewed documented beta blocker date and time   Airway Mallampati: III  TM Distance: >3 FB Neck ROM: full    Dental  (+) Teeth Intact   Pulmonary neg pulmonary ROS, former smoker,    Pulmonary exam normal        Cardiovascular Exercise Tolerance: Good hypertension, On Medications negative cardio ROS Normal cardiovascular exam Rhythm:regular Rate:Normal     Neuro/Psych  Neuromuscular disease negative psych ROS   GI/Hepatic negative GI ROS, Neg liver ROS,   Endo/Other  negative endocrine ROSdiabetes  Renal/GU Renal disease  negative genitourinary   Musculoskeletal   Abdominal   Peds  Hematology negative hematology ROS (+)   Anesthesia Other Findings Past Medical History: No date: Arthritis No date: Blood in semen No date: BPH (benign prostatic hypertrophy) No date: Cancer ([place])     Comment:  melanoma on back No date: DDD (degenerative disc disease), lumbar No date: 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 No date: Erectile dysfunction No date: Hematospermia No date: Hemorrhoids No date: History of adenomatous polyp of colon No date: History of kidney stones No date: Hyperlipidemia No date: Hypertension No date: Kidney stones No date: Obesity No date: 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[place]) No date: Prediabetes Past Surgical History: age 74: BACK SURGERY     Comment:  Broken back 05/05/2017: COLONOSCOPY WITH PROPOFOL; N/A     Comment:  Procedure: COLONOSCOPY WITH PROPOFOL;  Surgeon: [place],               Benay Pike, MD;  Location: [facility];  Service:               Gastroenterology;  Laterality: N/A; No date: HEMORRHOID SURGERY 04/25/2018: LAPAROSCOPIC APPENDECTOMY;  N/A     Comment:  Procedure: APPENDECTOMY LAPAROSCOPIC;  Surgeon:               Herbert Pun, MD;  Location: [facility] ORS;  Service:              General;  Laterality: N/A; No date: SMALL INTESTINE SURGERY   Reproductive/Obstetrics negative OB ROS                             Anesthesia Physical Anesthesia Plan  ASA: III  Anesthesia Plan: General ETT   Post-op Pain Management:  Regional for Post-op pain   Induction:   PONV Risk Score and Plan:   Airway Management Planned:   Additional Equipment:   Intra-op Plan:   Post-operative Plan:   Informed Consent: I have reviewed the patients History and Physical, chart, labs and discussed the procedure including the risks, benefits and alternatives for the proposed anesthesia with the patient or authorized representative who has indicated his/her understanding and acceptance.     Dental Advisory Given  Plan Discussed with: CRNA  Anesthesia Plan Comments:         Anesthesia Quick Evaluation

## 2019-04-03 NOTE — H&P (Signed)
Paper H&P to be scanned into permanent record. H&P reviewed. No significant changes noted.  

## 2019-04-03 NOTE — Anesthesia Postprocedure Evaluation (Signed)
Anesthesia Post Note  Patient: Fred Anderson  Procedure(s) Performed: SHOULDER ARTHROSCOPY WITH SUBSACAPULARIS REPAIR, OPEN ROTATOR CUFF REPAIR, SUBARCOMIAL DECOMPRESSION, BICEPS TENODESIS (Right Shoulder)  Patient location during evaluation: PACU Anesthesia Type: General Level of consciousness: awake and alert Pain management: pain level controlled Vital Signs Assessment: post-procedure vital signs reviewed and stable Respiratory status: spontaneous breathing, nonlabored ventilation, respiratory function stable and patient connected to nasal cannula oxygen Cardiovascular status: blood pressure returned to baseline and stable Postop Assessment: no apparent nausea or vomiting Anesthetic complications: no     Last Vitals:  Vitals:   04/03/19 1208 04/03/19 1239  BP:  139/83  Pulse:  86  Resp:  14  Temp: (!) 36.3 C (!) 36 C  SpO2:  93%    Last Pain:  Vitals:   04/03/19 1239  TempSrc: Temporal  PainSc: 0-No pain                 Molli Barrows

## 2019-06-18 ENCOUNTER — Encounter: Payer: Self-pay | Admitting: Emergency Medicine

## 2019-06-18 ENCOUNTER — Telehealth: Payer: Self-pay | Admitting: Emergency Medicine

## 2019-06-18 ENCOUNTER — Other Ambulatory Visit: Payer: Self-pay

## 2019-06-18 ENCOUNTER — Ambulatory Visit
Admission: EM | Admit: 2019-06-18 | Discharge: 2019-06-18 | Disposition: A | Discharge status: Home / Self Care | Payer: Medicare Other | Attending: Emergency Medicine | Admitting: Emergency Medicine

## 2019-06-18 DIAGNOSIS — H66011 Acute suppurative otitis media with spontaneous rupture of ear drum, right ear: Secondary | ICD-10-CM | POA: Diagnosis not present

## 2019-06-18 MED ORDER — AMOXICILLIN-POT CLAVULANATE ER 1000-62.5 MG PO TB12: 2.0000 | ORAL_TABLET | Freq: Two times a day (BID) | ORAL | 0 refills | Status: AC

## 2019-06-18 NOTE — ED Provider Notes (Signed)
HPI  SUBJECTIVE:  Fred Anderson is a 74 y.o. male who presents with right ear pain lasting for several minutes starting 2 days ago.  He states the pain suddenly stopped and then he had bloody otorrhea.  He states it is also draining pus.  He reports persistent occasional sharp seconds long ear pain particularly with self insufflation.  He reports decreased hearing.  States that he feels as if his ear is "stopped up".  Denies fevers body aches nasal congestion headache tinnitus vertigo facial rash.  Denies foreign body insertion trauma recent swimming.  He has tried peroxide and self insufflation.  No alleviating factors.  Pain is aggravated with self insufflation.  Past medical history of diabetes.  He takes 325 mg of aspirin daily.  No other antiplatelets anticoagulants.  No history of hypertension, otitis media.  DXA:JOINO, Shanon Brow, MD   Past Medical History:  Diagnosis Date  . Arthritis   . Blood in semen   . BPH (benign prostatic hypertrophy)   . Cancer ([place])    melanoma on back  . DDD (degenerative disc disease), lumbar   . Diabetes mellitus without complication ([place])    TYPE 2  . Erectile dysfunction   . Hematospermia   . Hemorrhoids   . History of adenomatous polyp of colon   . History of kidney stones   . Hyperlipidemia   . Hypertension   . Kidney stones   . Obesity   . 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[place])   . Prediabetes     Past Surgical History:  Procedure Laterality Date  . BACK SURGERY  age 76   Broken back  . COLONOSCOPY WITH PROPOFOL N/A 05/05/2017   Procedure: COLONOSCOPY WITH PROPOFOL;  Surgeon: Toledo, Benay Pike, MD;  Location: [facility];  Service: Gastroenterology;  Laterality: N/A;  . HEMORRHOID SURGERY    . LAPAROSCOPIC APPENDECTOMY N/A 04/25/2018   Procedure: APPENDECTOMY LAPAROSCOPIC;  Surgeon: Herbert Pun, MD;  Location: [facility] ORS;  Service: General;  Laterality: N/A;  . SHOULDER ARTHROSCOPY WITH OPEN ROTATOR CUFF REPAIR  Right 04/03/2019   Procedure: SHOULDER ARTHROSCOPY WITH SUBSACAPULARIS REPAIR, OPEN ROTATOR CUFF REPAIR, SUBARCOMIAL DECOMPRESSION, BICEPS TENODESIS;  Surgeon: Leim Fabry, MD;  Location: [facility] ORS;  Service: Orthopedics;  Laterality: Right;  . SMALL INTESTINE SURGERY      Family History  Problem Relation Age of Onset  . Colon cancer Father   . Cancer Mother   . Liver cancer Sister   . Kidney cancer Brother   . Bone cancer Brother   . Kidney disease Neg Hx   . Prostate cancer Neg Hx   . Bladder Cancer Neg Hx     Social History   Tobacco Use  . Smoking status: Former Smoker    Quit date: 08/15/1988    Years since quitting: 30.8  . Smokeless tobacco: Never Used  . Tobacco comment: quit september 11th 1991  Substance Use Topics  . Alcohol use: Yes    Alcohol/week: 12.0 standard drinks    Types: 12 Cans of beer per week  . Drug use: No    No current facility-administered medications for this encounter.  Current Outpatient Medications:  .  aspirin 325 MG tablet, Take 325 mg by mouth daily., Disp: , Rfl:  .  lovastatin (MEVACOR) 40 MG tablet, Take 40 mg by mouth at bedtime. , Disp: , Rfl: 0 .  metFORMIN (GLUCOPHAGE) 500 MG tablet, Take 1,000 mg by mouth 2 (two) times daily., Disp: , Rfl:  .  Multiple  Vitamin (MULTIVITAMIN WITH MINERALS) TABS tablet, Take 1 tablet by mouth daily., Disp: , Rfl:  .  Omega-3 Fatty Acids (FISH OIL) 500 MG CAPS, Take 500 mg by mouth 2 (two) times daily. , Disp: , Rfl:  .  tamsulosin (FLOMAX) 0.4 MG CAPS capsule, Take 1 capsule (0.4 mg total) by mouth daily., Disp: 90 capsule, Rfl: 3 .  acetaminophen (TYLENOL) 500 MG tablet, Take 2 tablets (1,000 mg total) by mouth every 8 (eight) hours., Disp: 90 tablet, Rfl: 2 .  amoxicillin-clavulanate (AUGMENTIN XR) 1000-62.5 MG 12 hr tablet, Take 2 tablets by mouth 2 (two) times daily for 10 days., Disp: 40 tablet, Rfl: 0 .  ondansetron (ZOFRAN ODT) 4 MG disintegrating tablet, Take 1 tablet (4 mg total) by mouth every  8 (eight) hours as needed for nausea or vomiting., Disp: 20 tablet, Rfl: 0 .  tadalafil (CIALIS) 5 MG tablet, Take 1 tablet (5 mg total) by mouth daily as needed for erectile dysfunction., Disp: 30 tablet, Rfl: 0  Allergies  Allergen Reactions  . Atorvastatin Other (See Comments)    Muscle pain.     ROS  As noted in HPI.   Physical Exam  BP (!) 146/81 (BP Location: Right Arm)   Pulse 96   Temp 97.8 F (36.6 C) (Oral)   Resp 16   Ht 5\' 11"  (1.803 m)   Wt 111.1 kg   SpO2 97%   BMI 34.17 kg/m   Constitutional: Well developed, well nourished, no acute distress Eyes:  EOMI, conjunctiva normal bilaterally HENT: Normocephalic, atraumatic,mucus membranes moist.  Left TM normal.  Right external ear normal.  No pain with traction on pinna, palpation of tragus.  No swelling behind the ear.  No mastoid tenderness.  Hearing grossly decreased on the right compared to the left.  Positive purulent material in the external ear canal.  Positive tympanic membrane perforation of the 11 o'clock position.  Positive blood and pus behind the TM. Respiratory: Normal inspiratory effort Cardiovascular: Normal rate GI: nondistended skin: No rash, skin intact Musculoskeletal: no deformities Neurologic: Alert & oriented x 3, no focal neuro deficits Psychiatric: Speech and behavior appropriate   ED Course   Medications - No data to display  No orders of the defined types were placed in this encounter.   No results found for this or any previous visit (from the past 24 hour(s)). No results found.  ED Clinical Impression  1. Non-recurrent acute suppurative otitis media of right ear with spontaneous rupture of tympanic membrane      ED Assessment/Plan  Patient with a tympanic membrane perforation most likely caused by otitis media.  Will start send home with high-dose amoxicillin 1000/62.5 extended release orally twice a day because he is older than 65 per up-to-date guidelines for 10 days.   he is at high risk for severe infection given his diabetes and age.  If this is cost prohibitive, will call in amoxicillin 1000 mg 3 times daily for 10 days.  Follow-up with ENT to make sure that this is healing in about 2 weeks, especially if he has persistent hearing loss. Dr. Earnestine Mealing on call.  Discussed with patient that he cannot get his ear wet.  Recommended silicone, wax, or silly putty earplugs.  Discussed MDM, treatment plan, and plan for follow-up with patient Gave patient written instructions on when to go to the ED. patient agrees with plan.   Meds ordered this encounter  Medications  . amoxicillin-clavulanate (AUGMENTIN XR) 1000-62.5 MG 12 hr tablet  Sig: Take 2 tablets by mouth 2 (two) times daily for 10 days.    Dispense:  40 tablet    Refill:  0    *This clinic note was created using [employer]. Therefore, there may be occasional mistakes despite careful proofreading.   ?    Melynda Ripple, MD 06/18/19 1034

## 2019-06-18 NOTE — Discharge Instructions (Addendum)
I am sending you home with Augmentin extended release for 10 days.  If it is too expensive let us know and I will call in a prescription of amoxicillin 1000 mg 3 times a day for 10 days.  Do not get your ear wet.  Make sure you wear a silicone or wax earplugs that conforms to your ear for getting in the shower.  Do not put anything into your ear.

## 2019-06-18 NOTE — ED Triage Notes (Signed)
Patient c/o right ear pain that started on Thursday.  Patient states he had bloody drainage from his right ear on Friday.

## 2019-06-18 NOTE — Telephone Encounter (Signed)
Pharmacist called and states that the Augmentin XR was going to cost the patient over 100 dollars out of pocket and if we could switch it to something less expensive.  Per Dr. Alphonzo Cruise Amoxicillin 1000mg  three times a day for 10 days was called in over the phone to the pharmacist.

## 2019-12-05 ENCOUNTER — Other Ambulatory Visit: Payer: Self-pay

## 2019-12-05 DIAGNOSIS — N401 Enlarged prostate with lower urinary tract symptoms: Secondary | ICD-10-CM

## 2019-12-06 ENCOUNTER — Other Ambulatory Visit: Payer: Medicare Other

## 2019-12-06 ENCOUNTER — Other Ambulatory Visit: Payer: Self-pay

## 2019-12-06 DIAGNOSIS — N138 Other obstructive and reflux uropathy: Secondary | ICD-10-CM

## 2019-12-07 LAB — PSA: Prostate Specific Ag, Serum: 2.3 ng/mL (ref 0.0–4.0)

## 2019-12-07 NOTE — Progress Notes (Signed)
12/08/2019 9:34 AM   Fred Anderson 09/18/1945 [id number]  Referring provider: Ezequiel Kayser, MD [facility] [place],  [place] [address]  Chief Complaint  Patient presents with  . Benign Prostatic Hypertrophy    HPI: Patient is a 74 year old male who presents today for a one year follow up for ED and BPH with LUTS.    BPH WITH LUTS His IPSS score today is 6, which is mild lower urinary tract symptomatology. He is mostly satisfied with his quality life due to his urinary symptoms.  His previous IPSS score was 7/1.  His previous PVR is 30 mL.  He does not have any complaints today.  Patient denies any gross hematuria, dysuria or suprapubic/flank pain.  Patient denies any fevers, chills, nausea or vomiting.   He does not have a family history of PCa.    He is currently on tamsulosin 0.4 mg daily.     IPSS    Row Name 12/08/19 0900         International Prostate Symptom Score   How often have you had the sensation of not emptying your bladder? Less than 1 in 5     How often have you had to urinate less than every two hours? Less than 1 in 5 times     How often have you found you stopped and started again several times when you urinated? Not at All     How often have you found it difficult to postpone urination? Less than 1 in 5 times     How often have you had a weak urinary stream? Less than 1 in 5 times     How often have you had to strain to start urination? Less than 1 in 5 times     How many times did you typically get up at night to urinate? 1 Time     Total IPSS Score 6       Quality of Life due to urinary symptoms   If you were to spend the rest of your life with your urinary condition just the way it is now how would you feel about that? Mostly Satisfied            Score:  1-7 Mild 8-19 Moderate 20-35 Severe  Erectile dysfunction He is and his wife are no longer interested in sexual activity.    PMH: Past Medical History:   Diagnosis Date  . Arthritis   . Blood in semen   . BPH (benign prostatic hypertrophy)   . Cancer ([place])    melanoma on back  . DDD (degenerative disc disease), lumbar   . Diabetes mellitus without complication ([place])    TYPE 2  . Erectile dysfunction   . Hematospermia   . Hemorrhoids   . History of adenomatous polyp of colon   . History of kidney stones   . Hyperlipidemia   . Hypertension   . Kidney stones   . Obesity   . 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[place])   . Prediabetes     Surgical History: Past Surgical History:  Procedure Laterality Date  . BACK SURGERY  age 50   Broken back  . COLONOSCOPY WITH PROPOFOL N/A 05/05/2017   Procedure: COLONOSCOPY WITH PROPOFOL;  Surgeon: Toledo, Benay Pike, MD;  Location: [facility];  Service: Gastroenterology;  Laterality: N/A;  . HEMORRHOID SURGERY    . LAPAROSCOPIC APPENDECTOMY N/A 04/25/2018   Procedure: APPENDECTOMY LAPAROSCOPIC;  Surgeon: Herbert Pun, MD;  Location: [facility];  Service: General;  Laterality: N/A;  . SHOULDER ARTHROSCOPY WITH OPEN ROTATOR CUFF REPAIR Right 04/03/2019   Procedure: SHOULDER ARTHROSCOPY WITH SUBSACAPULARIS REPAIR, OPEN ROTATOR CUFF REPAIR, SUBARCOMIAL DECOMPRESSION, BICEPS TENODESIS;  Surgeon: Leim Fabry, MD;  Location: [facility] OR[facility];  Service: Orthopedics;  Laterality: Right;  . SMALL INTESTINE SURGERY      Home Medications:  Allergies as of 12/08/2019      Reactions   Atorvastatin Other (See Comments)   Muscle pain.      Medication List       Accurate as of December 08, 2019  9:34 AM. If you have any questions, ask your nurse or doctor.        STOP taking these medications   acetaminophen 500 MG tablet Commonly known as: TYLENOL Stopped by: Maree Ainley, PA-C   ondansetron 4 MG disintegrating tablet Commonly known as: Zofran ODT Stopped by: Tannya Gonet, PA-C     TAKE these medications   aspirin 325 MG tablet Take 325 mg by mouth daily.   Fish Oil  500 MG Caps Take 500 mg by mouth 2 (two) times daily.   lovastatin 40 MG tablet Commonly known as: MEVACOR Take 40 mg by mouth at bedtime.   metFORMIN 500 MG tablet Commonly known as: GLUCOPHAGE Take 1,000 mg by mouth 2 (two) times daily.   multivitamin with minerals Tabs tablet Take 1 tablet by mouth daily.   tadalafil 5 MG tablet Commonly known as: CIALIS Take 1 tablet (5 mg total) by mouth daily as needed for erectile dysfunction.   tamsulosin 0.4 MG Caps capsule Commonly known as: FLOMAX Take 1 capsule (0.4 mg total) by mouth daily.       Allergies:  Allergies  Allergen Reactions  . Atorvastatin Other (See Comments)    Muscle pain.    Family History: Family History  Problem Relation Age of Onset  . Colon cancer Father   . Cancer Mother   . Liver cancer Sister   . Kidney cancer Brother   . Bone cancer Brother   . Kidney disease Neg Hx   . Prostate cancer Neg Hx   . Bladder Cancer Neg Hx     Social History:  reports that he quit smoking about 31 years ago. He has never used smokeless tobacco. He reports current alcohol use of about 12.0 standard drinks of alcohol per week. He reports that he does not use drugs.  ROS: For pertinent review of systems please refer to history of present illness  Physical Exam: BP (!) 161/80   Pulse (!) 102   Ht _0  (1.803 m)   Wt 260 lb (117.9 kg)   BMI 36.26 kg/m   Constitutional:  Well nourished. Alert and oriented, No acute distress. HEENT: [place] AT, mask in place.  Trachea midline Cardiovascular: No clubbing, cyanosis, or edema. Respiratory: Normal respiratory effort, no increased work of breathing. GU: No CVA tenderness.  No bladder fullness or masses.  Patient with uncircumcised phallus.  Foreskin easily retracted  Urethral meatus is patent.  No penile discharge.  Balanitis.  No penile lesions or rashes. Scrotum without lesions, cysts, rashes and/or edema.  Testicles are located scrotally bilaterally. No masses are  appreciated in the testicles. Left and right epididymis are normal. Rectal: Patient with  normal sphincter tone. Anus and perineum without scarring or rashes. No rectal masses are appreciated. Prostate is approximately 60 + grams, could only palpate the apex and midportion of the gland, no nodules are appreciated. Seminal  vesicles could not be palpated Skin: No rashes, bruises or suspicious lesions. Lymph: No inguinal adenopathy. Neurologic: Grossly intact, no focal deficits, moving all 4 extremities. Psychiatric: Normal mood and affect.  Laboratory Data:  3.6 ng/mL on 05/22/2014  Component     Latest Ref Rng & Units 12/02/2015 12/01/2016 12/01/2016 11/25/2017          8:29 AM  9:11 AM   Prostate Specific Ag, Serum     0.0 - 4.0 ng/mL 2.6 CANCELED 3.0 3.0   Component     Latest Ref Rng & Units 12/01/2018 12/06/2019           Prostate Specific Ag, Serum     0.0 - 4.0 ng/mL 2.3 2.3   Specimen:  Urine  Ref Range & Units 1 d ago  Color Yellow  Yellow      Clarity Clear  Clear      Specific Gravity 1.000 - 1.030  >=1.030      pH, Urine 5.0 - 8.0  5.0      Protein, Urinalysis Negative, Trace mg/dL Negative      Glucose, Urinalysis Negative mg/dL Negative      Ketones, Urinalysis Negative mg/dL Negative      Blood, Urinalysis Negative  Negative      Nitrite, Urinalysis Negative  Negative      Leukocyte Esterase, Urinalysis Negative  Negative      White Blood Cells, Urinalysis None Seen, 0-3 /hpf 0-3      Red Blood Cells, Urinalysis None Seen, 0-3 /hpf None Seen      Bacteria, Urinalysis None Seen /hpf None Seen      Squamous Epithelial Cells, Urinalysis Rare, Few, None Seen /hpf None Seen      Resulting Agency  [facility] - LAB  Specimen Collected: 12/06/19 8:36 AM Last Resulted: 12/06/19 12:07 PM  Received From: [facility]  Result Received: 12/07/19 8:32 AM   Specimen:  Blood  Ref Range & Units 1 d ago  Creatinine 0.7 - 1.3 mg/dL  0.6Low      Glomerular Filtration Rate (eGFR), MDRD Estimate >60 mL/min/1.73sq m [facility] - LAB  Specimen Collected: 12/06/19 8:36 AM Last Resulted: 12/06/19 12:02 PM  Received From: [place]  Result Received: 12/07/19 8:32 AM   Specimen:  Blood  Ref Range & Units 1 d ago  Hemoglobin A1C 4.2 - 5.6 % 6.7High      Average Blood Glucose (Calc) mg/dL 146      Resulting Agency  [facility] - LAB  Narrative Performed by [facility] - LAB Normal Range:  4.2 - 5.6%  Increased Risk: 5.7 - 6.4%  Diabetes:    >= 6.5%  Glycemic Control for adults with diabetes: <7%  Specimen Collected: 12/06/19 8:36 AM Last Resulted: 12/06/19 12:47 PM  Received From: [place]  Result Received: 12/07/19 8:32 AM    I have reviewed the labs   Assessment & Plan:    1. BPH with LU TS IPSS score is 6/1, it is improving Continue conservative management, avoiding bladder irritants and timed voiding's Continue tamsulosin 0.4 mg daily - refills given RTC in 12 months for IPSS, PSA and exam    Return in about 1 year (around 12/07/2020) for IPSS, PSA and exam.  Zara Council, [facility]  [address] [place], [place] [address] [phone]

## 2019-12-08 ENCOUNTER — Encounter: Payer: Self-pay | Admitting: Urology

## 2019-12-08 ENCOUNTER — Ambulatory Visit: Payer: Medicare Other | Admitting: Urology

## 2019-12-08 ENCOUNTER — Other Ambulatory Visit: Payer: Self-pay

## 2019-12-08 VITALS — BP 161/80 | HR 102 | Ht 71.0 in | Wt 260.0 lb

## 2019-12-08 DIAGNOSIS — N138 Other obstructive and reflux uropathy: Secondary | ICD-10-CM | POA: Diagnosis not present

## 2019-12-08 DIAGNOSIS — N401 Enlarged prostate with lower urinary tract symptoms: Secondary | ICD-10-CM

## 2019-12-08 DIAGNOSIS — R351 Nocturia: Secondary | ICD-10-CM | POA: Diagnosis not present

## 2019-12-08 MED ORDER — TAMSULOSIN HCL 0.4 MG PO CAPS: 0.4000 mg | ORAL_CAPSULE | Freq: Every day | ORAL | 3 refills | Status: AC

## 2020-01-01 ENCOUNTER — Other Ambulatory Visit: Payer: Self-pay | Admitting: Orthopedic Surgery

## 2020-01-01 DIAGNOSIS — S4991XA Unspecified injury of right shoulder and upper arm, initial encounter: Secondary | ICD-10-CM

## 2020-01-18 ENCOUNTER — Ambulatory Visit
Admission: RE | Admit: 2020-01-18 | Discharge: 2020-01-18 | Disposition: A | Discharge status: Home / Self Care | Payer: Medicare Other | Source: Ambulatory Visit | Attending: Orthopedic Surgery | Admitting: Orthopedic Surgery

## 2020-01-18 ENCOUNTER — Other Ambulatory Visit: Payer: Self-pay

## 2020-01-18 DIAGNOSIS — S4991XA Unspecified injury of right shoulder and upper arm, initial encounter: Secondary | ICD-10-CM | POA: Diagnosis not present

## 2020-05-06 ENCOUNTER — Encounter: Payer: Self-pay | Admitting: *Deleted

## 2020-05-06 ENCOUNTER — Other Ambulatory Visit: Payer: Self-pay

## 2020-05-06 ENCOUNTER — Emergency Department: Payer: Medicare Other

## 2020-05-06 ENCOUNTER — Emergency Department
Admission: EM | Admit: 2020-05-06 | Discharge: 2020-05-06 | Disposition: A | Discharge status: Home / Self Care | Payer: Medicare Other | Attending: Diagnostic Radiology | Admitting: Diagnostic Radiology

## 2020-05-06 ENCOUNTER — Encounter: Payer: Self-pay | Admitting: Emergency Medicine

## 2020-05-06 DIAGNOSIS — Z8051 Family history of malignant neoplasm of kidney: Secondary | ICD-10-CM | POA: Diagnosis not present

## 2020-05-06 DIAGNOSIS — Z79899 Other long term (current) drug therapy: Secondary | ICD-10-CM | POA: Diagnosis not present

## 2020-05-06 DIAGNOSIS — R0602 Shortness of breath: Secondary | ICD-10-CM | POA: Diagnosis present

## 2020-05-06 DIAGNOSIS — Z85820 Personal history of malignant melanoma of skin: Secondary | ICD-10-CM | POA: Diagnosis not present

## 2020-05-06 DIAGNOSIS — Z809 Family history of malignant neoplasm, unspecified: Secondary | ICD-10-CM | POA: Diagnosis not present

## 2020-05-06 DIAGNOSIS — Z808 Family history of malignant neoplasm of other organs or systems: Secondary | ICD-10-CM | POA: Diagnosis not present

## 2020-05-06 DIAGNOSIS — E1142 Type 2 diabetes mellitus with diabetic polyneuropathy: Secondary | ICD-10-CM | POA: Diagnosis not present

## 2020-05-06 DIAGNOSIS — Z87891 Personal history of nicotine dependence: Secondary | ICD-10-CM | POA: Diagnosis not present

## 2020-05-06 DIAGNOSIS — Z7984 Long term (current) use of oral hypoglycemic drugs: Secondary | ICD-10-CM | POA: Insufficient documentation

## 2020-05-06 DIAGNOSIS — J90 Pleural effusion, not elsewhere classified: Secondary | ICD-10-CM | POA: Diagnosis not present

## 2020-05-06 DIAGNOSIS — R918 Other nonspecific abnormal finding of lung field: Secondary | ICD-10-CM

## 2020-05-06 DIAGNOSIS — Z20822 Contact with and (suspected) exposure to covid-19: Secondary | ICD-10-CM | POA: Diagnosis not present

## 2020-05-06 DIAGNOSIS — Z7982 Long term (current) use of aspirin: Secondary | ICD-10-CM | POA: Insufficient documentation

## 2020-05-06 DIAGNOSIS — Z800 Family history of malignant neoplasm of digestive organs: Secondary | ICD-10-CM | POA: Diagnosis not present

## 2020-05-06 LAB — BASIC METABOLIC PANEL
Anion gap: 12 (ref 5–15)
BUN: 11 mg/dL (ref 8–23)
CO2: 22 mmol/L (ref 22–32)
Calcium: 9.4 mg/dL (ref 8.9–10.3)
Chloride: 101 mmol/L (ref 98–111)
Creatinine, Ser: 0.69 mg/dL (ref 0.61–1.24)
GFR, Estimated: >60 mL/min (ref >60)
Glucose, Bld: 186 mg/dL — ABNORMAL HIGH (ref 70–99)
Potassium: 4.1 mmol/L (ref 3.5–5.1)
Sodium: 135 mmol/L (ref 135–145)

## 2020-05-06 LAB — CBC
HCT: 44.9 % (ref 39.0–52.0)
Hemoglobin: 15 g/dL (ref 13.0–17.0)
MCH: 31.6 pg (ref 26.0–34.0)
MCHC: 33.4 g/dL (ref 30.0–36.0)
MCV: 94.7 fL (ref 80.0–100.0)
Platelets: 211 10*3/uL (ref 150–400)
RBC: 4.74 MIL/uL (ref 4.22–5.81)
RDW: 12.5 % (ref 11.5–15.5)
WBC: 7.3 10*3/uL (ref 4.0–10.5)
nRBC: 0 % (ref 0.0–0.2)

## 2020-05-06 LAB — BODY FLUID CELL COUNT WITH DIFFERENTIAL
Eos, Fluid: 0 %
Lymphs, Fluid: 81 %
Monocyte-Macrophage-Serous Fluid: 15 %
Neutrophil Count, Fluid: 4 %
Total Nucleated Cell Count, Fluid: 3497 cu mm

## 2020-05-06 LAB — RESP PANEL BY RT-PCR (FLU A&B, COVID) ARPGX2
Influenza A by PCR: NEGATIVE
Influenza B by PCR: NEGATIVE
SARS Coronavirus 2 by RT PCR: NEGATIVE

## 2020-05-06 LAB — ALBUMIN, PLEURAL OR PERITONEAL FLUID: Albumin, Fluid: 3 g/dL

## 2020-05-06 LAB — GLUCOSE, PLEURAL OR PERITONEAL FLUID: Glucose, Fluid: 124 mg/dL

## 2020-05-06 LAB — LACTATE DEHYDROGENASE, PLEURAL OR PERITONEAL FLUID: LD, Fluid: 234 U/L — ABNORMAL HIGH (ref 3–23)

## 2020-05-06 LAB — PROTEIN, PLEURAL OR PERITONEAL FLUID: Total protein, fluid: 4.7 g/dL

## 2020-05-06 LAB — TROPONIN I (HIGH SENSITIVITY)
Troponin I (High Sensitivity): 8 ng/L (ref <18)
Troponin I (High Sensitivity): 6 ng/L (ref <18)

## 2020-05-06 LAB — BRAIN NATRIURETIC PEPTIDE: B Natriuretic Peptide: 27.8 pg/mL (ref 0.0–100.0)

## 2020-05-06 MED ORDER — IOHEXOL 350 MG/ML SOLN
100.0000 mL | Freq: Once | INTRAVENOUS | Status: AC | PRN
  Administered 2020-05-06: 100 mL via INTRAVENOUS

## 2020-05-06 MED ORDER — ACETAMINOPHEN 325 MG PO TABS
650.0000 mg | ORAL_TABLET | Freq: Once | ORAL | Status: AC
  Administered 2020-05-06: 650 mg via ORAL
  Filled 2020-05-06: qty 2

## 2020-05-06 NOTE — ED Triage Notes (Signed)
C/O intermittent SOB and chest pain since taking [place] booster on 04/10/2020.  Denies CP at this time.  Skin warm and dry. SOB/ DOE noted.

## 2020-05-06 NOTE — Progress Notes (Signed)
  Oncology Nurse Navigator Documentation  Navigator Location: [facility] (05/06/20 1500) Referral Date to RadOnc/MedOnc: 05/06/20 (05/06/20 1500) )Navigator Encounter Type: Telephone (05/06/20 1500) Telephone: Appt Confirmation/Clarification;Outgoing Call (05/06/20 1500) Abnormal Finding Date: 05/06/20 (05/06/20 1500)                   Treatment Phase: Abnormal Scans (05/06/20 1500) Barriers/Navigation Needs: Coordination of Care (05/06/20 1500)   Interventions: Coordination of Care (05/06/20 1500)   Coordination of Care: Appts (05/06/20 1500)       per Dr. Jacinto Reap, pt needs to be scheduled to follow up with him on Tues 2/22 at 9am. 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 Pt scheduled and message left with patient regarding new pt visit. Contact info given and instructed to call back with any further questions or needs.            Time Spent with Patient: 30 (05/06/20 1500)

## 2020-05-06 NOTE — ED Notes (Signed)
Patient transported to IR; anticipated time for procedure is 13min-45min

## 2020-05-06 NOTE — ED Provider Notes (Signed)
I seen care of this patient approximately 1500.  Please see operative providers note for full details regarding patient's initial evaluation assessment.  In brief patient presents for assessment of some shortness of breath worsening the last 3 weeks.  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  After discussion with oncology and IR plan is for IR guided thoracentesis and likely discharge with further outpatient evaluation.  Plan is to follow-up with patient and reassess after return from IR.  On reassessment patient states he feels better nursing home.  He is not hypoxic and has clear lungs bilaterally.  Discharge stable condition.  Strict return precautions advised and discussed.  Instructed to follow-up with oncology.   Lucrezia Starch, MD 05/06/20 [phone]

## 2020-05-06 NOTE — ED Provider Notes (Signed)
[facility] Emergency Department Provider Note ____________________________________________   Event Date/Time   First MD Initiated Contact with Patient 05/06/20 [phone]     (approximate)  I have reviewed the triage vital signs and the nursing notes.   HISTORY  Chief Complaint Shortness of Breath    HPI Fred Anderson is a 75 y.o. male with PMH as noted below including diabetes, hypertension, and hyperlipidemia who presents with shortness of breath over the last 3 weeks, somewhat worse in the last several days, persistent course, worse with exertion, and associated with intermittent chest pain.  He believes that the symptoms started after he had his [employer] covid booster in late January.  He last had chest pain about 3 days ago that lasted for several hours.  He was going to come in and get seen at that time but it resolved.  He denies any swelling in the legs.  He did note a few elevated blood pressure readings over 761 systolic.  He states he is not on any blood pressure medications.  Past Medical History:  Diagnosis Date  . Arthritis   . Blood in semen   . BPH (benign prostatic hypertrophy)   . Cancer ([place])    melanoma on back  . DDD (degenerative disc disease), lumbar   . Diabetes mellitus without complication ([place])    TYPE 2  . Erectile dysfunction   . Hematospermia   . Hemorrhoids   . History of adenomatous polyp of colon   . History of kidney stones   . Hyperlipidemia   . Hypertension   . Kidney stones   . Obesity   . 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[place])   . Prediabetes     Patient Active Problem List   Diagnosis Date Noted  . Acute appendicitis with localized peritonitis 04/25/2018  . DDD (degenerative disc disease), lumbar 11/29/2017  . Fecal smearing 03/18/2017  . Family history of colon cancer 12/15/2016  . History of adenomatous polyp of colon 09/18/2016  . High risk medication use 09/14/2016  . Diabetic  peripheral neuropathy associated with type 2 diabetes mellitus ([place]) 09/05/2015  . Erectile dysfunction of organic origin 11/29/2014  . BPH with obstruction/lower urinary tract symptoms 09/02/2014  . Hematospermia 09/02/2014  . HLD (hyperlipidemia) 02/05/2014  . Borderline diabetes 02/05/2014  . Combined hyperlipidemia associated with type 2 diabetes mellitus ([place]) 02/05/2014    Past Surgical History:  Procedure Laterality Date  . BACK SURGERY  age 24   Broken back  . COLONOSCOPY WITH PROPOFOL N/A 05/05/2017   Procedure: COLONOSCOPY WITH PROPOFOL;  Surgeon: Toledo, Benay Pike, MD;  Location: [facility] ENDOSCOPY;  Service: Gastroenterology;  Laterality: N/A;  . HEMORRHOID SURGERY    . LAPAROSCOPIC APPENDECTOMY N/A 04/25/2018   Procedure: APPENDECTOMY LAPAROSCOPIC;  Surgeon: Herbert Pun, MD;  Location: [facility] ORS;  Service: General;  Laterality: N/A;  . SHOULDER ARTHROSCOPY WITH OPEN ROTATOR CUFF REPAIR Right 04/03/2019   Procedure: SHOULDER ARTHROSCOPY WITH SUBSACAPULARIS REPAIR, OPEN ROTATOR CUFF REPAIR, SUBARCOMIAL DECOMPRESSION, BICEPS TENODESIS;  Surgeon: Leim Fabry, MD;  Location: [facility] ORS;  Service: Orthopedics;  Laterality: Right;  . SMALL INTESTINE SURGERY      Prior to Admission medications   Medication Sig Start Date End Date Taking? Authorizing Provider  aspirin 325 MG tablet Take 325 mg by mouth daily.    [provider]  lovastatin (MEVACOR) 40 MG tablet Take 40 mg by mouth at bedtime.  10/03/17   [provider]  metFORMIN (GLUCOPHAGE) 500 MG tablet  Take 1,000 mg by mouth 2 (two) times daily. 03/02/19   [provider]  Multiple Vitamin (MULTIVITAMIN WITH MINERALS) TABS tablet Take 1 tablet by mouth daily.    [provider]  Omega-3 Fatty Acids (FISH OIL) 500 MG CAPS Take 500 mg by mouth 2 (two) times daily.     [provider]  tadalafil (CIALIS) 5 MG tablet Take 1 tablet (5 mg total) by mouth daily as needed for erectile  dysfunction. 11/29/17   Zara Council A, PA-C  tamsulosin (FLOMAX) 0.4 MG CAPS capsule Take 1 capsule (0.4 mg total) by mouth daily. 12/08/19   Zara Council A, PA-C    Allergies Atorvastatin  Family History  Problem Relation Age of Onset  . Colon cancer Father   . Cancer Mother   . Liver cancer Sister   . Kidney cancer Brother   . Bone cancer Brother   . Kidney disease Neg Hx   . Prostate cancer Neg Hx   . Bladder Cancer Neg Hx     Social History Social History   Tobacco Use  . Smoking status: Former Smoker    Quit date: 08/15/1988    Years since quitting: 31.7  . Smokeless tobacco: Never Used  . Tobacco comment: quit september 11th 1991  Vaping Use  . Vaping Use: Never used  Substance Use Topics  . Alcohol use: Yes    Alcohol/week: 12.0 standard drinks    Types: 12 Cans of beer per week  . Drug use: No    Review of Systems  Constitutional: No fever. Eyes: No redness. ENT: No sore throat. Cardiovascular: Positive for intermittent chest pain. Respiratory: Positive for shortness of breath. Gastrointestinal: No vomiting or diarrhea.  Genitourinary: Negative for dysuria.  Musculoskeletal: Negative for back pain. Skin: Negative for rash. Neurological: Negative for headache.   ____________________________________________   PHYSICAL EXAM:  VITAL SIGNS: ED Triage Vitals  Enc Vitals Group     BP 05/06/20 0914 132/72     Pulse Rate 05/06/20 0914 (!) 110     Resp 05/06/20 0914 20     Temp 05/06/20 0914 97.8 F (36.6 C)     Temp Source 05/06/20 0914 Oral     SpO2 05/06/20 0914 93 %     Weight 05/06/20 0912 248 lb (112.5 kg)     Height 05/06/20 0912 5\' 11"  (1.803 m)     Head Circumference --      Peak Flow --      Pain Score 05/06/20 0912 0     Pain Loc --      Pain Edu? --      Excl. in [place]? --     Constitutional: Alert and oriented.  Relatively well appearing and in no acute distress. Eyes: Conjunctivae are normal.  Head: Atraumatic. Nose: No  congestion/rhinnorhea. Mouth/Throat: Mucous membranes are moist.   Neck: Normal range of motion.  Cardiovascular: Tachycardic, regular rhythm. Grossly normal heart sounds.  Good peripheral circulation. Respiratory: Normal respiratory effort.  No retractions. Lungs CTAB. Gastrointestinal: Soft and nontender. No distention.  Genitourinary: No flank tenderness. Musculoskeletal: No lower extremity edema.  Extremities warm and well perfused.  Neurologic:  Normal speech and language. No gross focal neurologic deficits are appreciated.  Skin:  Skin is warm and dry. No rash noted. Psychiatric: Mood and affect are normal. Speech and behavior are normal.  ____________________________________________   LABS (all labs ordered are listed, but only abnormal results are displayed)  Labs Reviewed  BASIC METABOLIC PANEL - Abnormal;  Notable for the following components:      Result Value   Glucose, Bld 186 (*)    All other components within normal limits  RESP PANEL BY RT-PCR (FLU A&B, COVID) ARPGX2  CBC  BRAIN NATRIURETIC PEPTIDE  TROPONIN I (HIGH SENSITIVITY)  TROPONIN I (HIGH SENSITIVITY)   ____________________________________________  EKG  ED ECG REPORT I, Arta Silence, the attending physician, personally viewed and interpreted this ECG.  Date: 05/06/2020 EKG Time: 0900 Rate: 112 Rhythm: Sinus tachycardia QRS Axis: normal Intervals: normal ST/T Wave abnormalities: normal Narrative Interpretation: no evidence of acute ischemia  ____________________________________________  RADIOLOGY  Chest x-ray interpreted by me shows right pleural effusion with no focal infiltrate  ____________________________________________   PROCEDURES  Procedure(s) performed: No  Procedures  Critical Care performed: No ____________________________________________   INITIAL IMPRESSION / ASSESSMENT AND PLAN / ED COURSE  Pertinent labs & imaging results that were available during my care of  the patient were reviewed by me and considered in my medical decision making (see chart for details).  75 year old male with PMH as noted above including diabetes, hypertension, and hyperlipidemia presents with persistent shortness of breath over the last several weeks associated with intermittent chest pain most recently several days ago.  He associates the symptoms as starting after his booster dose of the [place] vaccine in late January.  I reviewed the past medical records in [place].  The patient was last seen in the ED in April for ear pain, and previously in 2020 after a fall.  He has no recent admissions.  He has no significant CAD, CHF, or COPD history.  On exam the patient is overall well-appearing.  O2 saturation is in the low 90s on room air and his heart rate is around 110.  Other vital signs are normal.  The lungs are clear to auscultation.  There is no peripheral edema.  Physical exam is otherwise unremarkable.  Differential includes bronchitis, bronchospasm, pneumonia, COVID-19, new onset CHF, ACS, myocarditis, or possible PE given the tachycardia.  We will obtain a chest x-ray and lab work-up.  If these are unrevealing, we will obtain a CT angio chest to rule out PE.  ----------------------------------------- 2:26 PM on 05/06/2020 -----------------------------------------  Chest x-ray showed a right pleural effusion.  Lab work-up is unremarkable.  CT chest is negative for PE but shows right upper lung masses with surrounding lymphadenopathy and a moderate effusion, concerning for malignancy.  On reassessment, the patient continues to appear relatively comfortable and his O2 saturation is around 93% on room air.  His vital signs are stable.  I consulted Dr. Rogue Bussing from oncology who recommends ordering a diagnostic thoracentesis which will also likely be therapeutic for the patient.  I consulted Dr. Anselm Pancoast from IR who agrees to do the procedure.  At this time, given his  otherwise reassuring lab work-up and good oxygenation, the patient likely will be able to go home to complete his work-up as an outpatient.  Dr. Rogue Bussing recommends follow-up in the next several days.  Plan will be to reassess when the patient returns from thoracentesis.  ----------------------------------------- 3:13 PM on 05/06/2020 -----------------------------------------  Patient is going to thoracentesis now.  I signed the case out to the oncoming physician Dr. Tamala Julian.  ____________________________________________   FINAL CLINICAL IMPRESSION(S) / ED DIAGNOSES  Final diagnoses:  Pleural effusion  Lung mass      NEW MEDICATIONS STARTED DURING THIS VISIT:  New Prescriptions   No medications on file     Note:  This document  was prepared using Systems analyst and may include unintentional dictation errors.    Arta Silence, MD 05/06/20 [phone]

## 2020-05-06 NOTE — Procedures (Signed)
PROCEDURE SUMMARY:  Successful US guided right thoracentesis. Yielded 1 L of blood tinged fluid. Pt tolerated procedure well. No immediate complications.  Specimen was sent for labs. CXR ordered.  EBL < 5 mL  Ascencion Dike PA-C 05/06/2020 3:56 PM

## 2020-05-06 NOTE — Discharge Instructions (Signed)
We have contacted Dr. Rogue Bussing from the cancer center for follow-up.  His office should contact you in the next 1 to 2 days for follow-up later this week.  Return to the ER for new, worsening, or persistent severe shortness of breath, chest pain, vomiting, fever, weakness, or any other new or worsening symptoms that concern you.

## 2020-05-06 NOTE — ED Notes (Signed)
Pt ambulatory to the toilet in the room idependently

## 2020-05-07 ENCOUNTER — Other Ambulatory Visit: Payer: Self-pay | Admitting: Internal Medicine

## 2020-05-07 ENCOUNTER — Other Ambulatory Visit: Payer: Self-pay

## 2020-05-07 ENCOUNTER — Inpatient Hospital Stay: Payer: Medicare Other

## 2020-05-07 ENCOUNTER — Inpatient Hospital Stay: Payer: Medicare Other | Attending: Internal Medicine | Admitting: Internal Medicine

## 2020-05-07 ENCOUNTER — Encounter: Payer: Self-pay | Admitting: Internal Medicine

## 2020-05-07 VITALS — BP 121/73 | HR 100 | Temp 98.0°F | Resp 20 | Ht 71.0 in | Wt 207.0 lb

## 2020-05-07 DIAGNOSIS — I700 Atherosclerosis of aorta: Secondary | ICD-10-CM | POA: Diagnosis not present

## 2020-05-07 DIAGNOSIS — R51 Headache with orthostatic component, not elsewhere classified: Secondary | ICD-10-CM | POA: Insufficient documentation

## 2020-05-07 DIAGNOSIS — M5136 Other intervertebral disc degeneration, lumbar region: Secondary | ICD-10-CM | POA: Insufficient documentation

## 2020-05-07 DIAGNOSIS — R519 Headache, unspecified: Secondary | ICD-10-CM | POA: Diagnosis not present

## 2020-05-07 DIAGNOSIS — Z87891 Personal history of nicotine dependence: Secondary | ICD-10-CM | POA: Insufficient documentation

## 2020-05-07 DIAGNOSIS — R197 Diarrhea, unspecified: Secondary | ICD-10-CM | POA: Insufficient documentation

## 2020-05-07 DIAGNOSIS — N400 Enlarged prostate without lower urinary tract symptoms: Secondary | ICD-10-CM | POA: Diagnosis not present

## 2020-05-07 DIAGNOSIS — E119 Type 2 diabetes mellitus without complications: Secondary | ICD-10-CM | POA: Diagnosis not present

## 2020-05-07 DIAGNOSIS — Z87442 Personal history of urinary calculi: Secondary | ICD-10-CM | POA: Diagnosis not present

## 2020-05-07 DIAGNOSIS — R5383 Other fatigue: Secondary | ICD-10-CM | POA: Diagnosis not present

## 2020-05-07 DIAGNOSIS — I10 Essential (primary) hypertension: Secondary | ICD-10-CM | POA: Diagnosis not present

## 2020-05-07 DIAGNOSIS — E785 Hyperlipidemia, unspecified: Secondary | ICD-10-CM | POA: Insufficient documentation

## 2020-05-07 DIAGNOSIS — Z79899 Other long term (current) drug therapy: Secondary | ICD-10-CM | POA: Diagnosis not present

## 2020-05-07 DIAGNOSIS — R590 Localized enlarged lymph nodes: Secondary | ICD-10-CM | POA: Diagnosis not present

## 2020-05-07 DIAGNOSIS — Z85820 Personal history of malignant melanoma of skin: Secondary | ICD-10-CM | POA: Diagnosis not present

## 2020-05-07 DIAGNOSIS — E669 Obesity, unspecified: Secondary | ICD-10-CM | POA: Insufficient documentation

## 2020-05-07 DIAGNOSIS — J90 Pleural effusion, not elsewhere classified: Secondary | ICD-10-CM | POA: Diagnosis not present

## 2020-05-07 DIAGNOSIS — R918 Other nonspecific abnormal finding of lung field: Secondary | ICD-10-CM | POA: Insufficient documentation

## 2020-05-07 DIAGNOSIS — C3411 Malignant neoplasm of upper lobe, right bronchus or lung: Secondary | ICD-10-CM | POA: Insufficient documentation

## 2020-05-07 DIAGNOSIS — Z86010 Personal history of colonic polyps: Secondary | ICD-10-CM | POA: Insufficient documentation

## 2020-05-07 LAB — PATHOLOGIST SMEAR REVIEW

## 2020-05-07 NOTE — Assessment & Plan Note (Addendum)
#  Central obstructing right lung mass/upper lobe mass with pleural effusion-highly concerning for malignancy.  S/p thoracentesis; awaiting cytology  #Recommend PET scan for further evaluation/bone lesions.  Patient will likely need repeat core biopsy for-NGS testing.   #Discussed with patient that if malignancy is confirmed; unfortunately appears to be advanced age given the pleural effusion.  Discussed that patient will likely need chemotherapy/immunotherapy/ targeted therapy as part of treatment plan.  The treatment will be decided based on patient's NGS testing.  Patient also need brain MRI-given the risk of metastasis to brain.  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  However he is significantly concerned about the potential quality of life detriment on therapies.  I assured the patient that we will do the best we can to help his quality of life during the treatments.  Also did introduce palliative care services available.  Will refer to palliative care once about diagnosis/treatment management.  #Diabetes- HbA1c- 6.7 [Sep 2021]-we will need to monitor closely on treatments.  Thank you Dr.Theis  for allowing me to participate in the care of your pleasant patient. Please do not hesitate to contact me with questions or concerns in the interim.  # DISPOSITION: # PET ASAP # MRI Brain ASAP # follow up TBD- Dr.B  # I reviewed the blood work- with the patient in detail; also reviewed the imaging independently [as summarized above]; and with the patient in detail.   Addendum:Discussed with Dr. Allena Napoleon, pathology.  Cytology positive for malignant cells.  Ordered cytopathology review.

## 2020-05-07 NOTE — Progress Notes (Signed)
Having SOB. States he has not been feeling well for about 3 weeks after he received booster shot.

## 2020-05-07 NOTE — Progress Notes (Signed)
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  # Enlarged pulmonary arteries, indicative of pulmonary arterial Hypertension.  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; [place]   Oncology History   No history exists.     HISTORY OF PRESENTING ILLNESS:  Fred Anderson 75 y.o.  male history of smoking is here for further evaluation and recommendations for lung mass/nodule.  Patient states that he had a booster injection for his COVID in January 2022.  Since then he had noted chest congestion; shortness of breath especially exertion.  Also noted to have chest pains/coughs.  He had episodes of blood in his cough sputum x2.   He was evaluated in the emergency room on 2/21-with CT-that showed right subpleural effusion/lung masses all concerning for malignancy.  Patient had thoracentesis done; cytology pending.  Patient notes to have slight improvement of his difficulty breathing after thoracentesis.  However he continues to have shortness of breath.  On exertion.  Chronic mild diarrhea because of Metformin.  Complains of intermittent headaches in the last few days.  No vision changes no falls.   Review of Systems  Constitutional: Positive for malaise/fatigue and weight loss. Negative for chills, diaphoresis and fever.  HENT: Negative for nosebleeds and sore throat.   Eyes: Negative for double vision.  Respiratory: Positive for cough and sputum production. Negative for hemoptysis, shortness of breath and wheezing.    Cardiovascular: Positive for chest pain. Negative for palpitations, orthopnea and leg swelling.  Gastrointestinal: Positive for diarrhea. Negative for abdominal pain, blood in stool, constipation, heartburn, melena, nausea and vomiting.  Genitourinary: Negative for dysuria, frequency and urgency.  Musculoskeletal: Positive for back pain and joint pain.  Skin: Negative.  Negative for itching and rash.  Neurological: Positive for headaches. Negative for dizziness, tingling, focal weakness and weakness.  Endo/Heme/Allergies: Does not bruise/bleed easily.  Psychiatric/Behavioral: Negative for depression. The patient is not nervous/anxious and does not have insomnia.      MEDICAL HISTORY:  Past Medical History:  Diagnosis Date  . Arthritis   . Blood in semen   . BPH (benign prostatic hypertrophy)   . Cancer ([place])    melanoma on back  . DDD (degenerative disc disease), lumbar   . Diabetes mellitus without complication ([place])    TYPE 2  . Erectile dysfunction   . Hematospermia   . Hemorrhoids   . History of adenomatous polyp of colon   . History of kidney stones   . Hyperlipidemia   . Hypertension   . Kidney stones   . Obesity   . 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[place])   . Prediabetes     SURGICAL HISTORY: Past Surgical History:  Procedure Laterality Date  . BACK SURGERY  age 69   Broken back  . COLONOSCOPY WITH PROPOFOL N/A 05/05/2017   Procedure: COLONOSCOPY WITH PROPOFOL;  Surgeon: Toledo, Benay Pike, MD;  Location: [facility] ENDOSCOPY;  Service: Gastroenterology;  Laterality: N/A;  . HEMORRHOID SURGERY    . LAPAROSCOPIC APPENDECTOMY N/A 04/25/2018   Procedure: APPENDECTOMY LAPAROSCOPIC;  Surgeon: Herbert Pun, MD;  Location: [facility] ORS;  Service: General;  Laterality: N/A;  . SHOULDER ARTHROSCOPY  WITH OPEN ROTATOR CUFF REPAIR Right 04/03/2019   Procedure: SHOULDER ARTHROSCOPY WITH SUBSACAPULARIS REPAIR, OPEN ROTATOR CUFF REPAIR, SUBARCOMIAL DECOMPRESSION,  BICEPS TENODESIS;  Surgeon: Leim Fabry, MD;  Location: [facility] ORS;  Service: Orthopedics;  Laterality: Right;  . SMALL INTESTINE SURGERY      SOCIAL HISTORY: Social History   Socioeconomic History  . Marital status: Married    Spouse name: Not on file  . Number of children: Not on file  . Years of education: Not on file  . Highest education level: Not on file  Occupational History  . Not on file  Tobacco Use  . Smoking status: Former Smoker    Quit date: 08/15/1988    Years since quitting: 31.7  . Smokeless tobacco: Never Used  . Tobacco comment: quit september 11th 1991  Vaping Use  . Vaping Use: Never used  Substance and Sexual Activity  . Alcohol use: Yes    Alcohol/week: 12.0 standard drinks    Types: 12 Cans of beer per week  . Drug use: No  . Sexual activity: Not on file  Other Topics Concern  . Not on file  Social History Narrative   Quit smoking in 1991; used to smoke 3 ppd. Lives in [place]; with wife; and 2 daughters. 2-3 drink 4/ week. Worked in Tourist information centre manager; retired in 2013; raised cattle.    Social Determinants of Health   Financial Resource Strain: Not on file  Food Insecurity: Not on file  Transportation Needs: Not on file  Physical Activity: Not on file  Stress: Not on file  Social Connections: Not on file  Intimate Partner Violence: Not on file    FAMILY HISTORY: Family History  Problem Relation Age of Onset  . Colon cancer Father   . Cancer Mother   . Liver cancer Sister   . Kidney cancer Brother   . Bone cancer Brother   . Kidney disease Neg Hx   . Prostate cancer Neg Hx   . Bladder Cancer Neg Hx     ALLERGIES:  is allergic to atorvastatin.  MEDICATIONS:  Current Outpatient Medications  Medication Sig Dispense Refill  . aspirin 325 MG tablet Take 325 mg by mouth daily.    Marland Kitchen lovastatin (MEVACOR) 40 MG tablet Take 40 mg by mouth at bedtime.   0  . metFORMIN (GLUCOPHAGE) 500 MG tablet Take 1,000 mg by mouth 2 (two) times daily.    .  Multiple Vitamin (MULTIVITAMIN WITH MINERALS) TABS tablet Take 1 tablet by mouth daily.    . Omega-3 Fatty Acids (FISH OIL) 500 MG CAPS Take 500 mg by mouth 2 (two) times daily.     . tamsulosin (FLOMAX) 0.4 MG CAPS capsule Take 1 capsule (0.4 mg total) by mouth daily. 90 capsule 3   No current facility-administered medications for this visit.      Marland Kitchen  PHYSICAL EXAMINATION: ECOG PERFORMANCE STATUS: 1 - Symptomatic but completely ambulatory  Vitals:   05/07/20 0904  BP: 121/73  Pulse: 100  Resp: 20  Temp: 98 F (36.7 C)  SpO2: 95%   Filed Weights   05/07/20 0904  Weight: 207 lb (93.9 kg)    Physical Exam Constitutional:      Comments: Ambulating independently.  Accompanied by his wife.  HENT:     Head: Normocephalic and atraumatic.     Mouth/Throat:     Mouth: Oropharynx is clear and moist.     Pharynx: No oropharyngeal exudate.  Eyes:     Pupils: Pupils are  equal, round, and reactive to light.  Cardiovascular:     Rate and Rhythm: Normal rate and regular rhythm.  Pulmonary:     Effort: No respiratory distress.     Breath sounds: No wheezing.     Comments: Decreased breath on the right side compared to the left.  No wheeze or crackles. Abdominal:     General: Bowel sounds are normal. There is no distension.     Palpations: Abdomen is soft. There is no mass.     Tenderness: There is no abdominal tenderness. There is no guarding or rebound.  Musculoskeletal:        General: No tenderness or edema. Normal range of motion.     Cervical back: Normal range of motion and neck supple.  Skin:    General: Skin is warm.  Neurological:     Mental Status: He is alert and oriented to person, place, and time.  Psychiatric:        Mood and Affect: Affect normal.      LABORATORY DATA:  I have reviewed the data as listed Lab Results  Component Value Date   WBC 7.3 05/06/2020   HGB 15.0 05/06/2020   HCT 44.9 05/06/2020   MCV 94.7 05/06/2020   PLT 211 05/06/2020    Recent Labs    05/06/20 0916  NA 135  K 4.1  CL 101  CO2 22  GLUCOSE 186*  BUN 11  CREATININE 0.69  CALCIUM 9.4  GFRNONAA >60    RADIOGRAPHIC STUDIES: I have personally reviewed the radiological images as listed and agreed with the findings in the report. DG Chest 2 View  Result Date: 05/06/2020 CLINICAL DATA:  Intermittent shortness of breath EXAM: CHEST - 2 VIEW COMPARISON:  2016 FINDINGS: Right pleural effusion. Likely partially loculated extending along the chest wall laterally. There is also fissural fluid. Adjacent right lung atelectasis. Left lung is clear. Heart size is normal. No acute osseous abnormality. IMPRESSION: Likely partially loculated small to moderate right pleural effusion. Adjacent atelectasis. Electronically Signed   By: Macy Mis M.D.   On: 05/06/2020 10:00   CT Angio Chest PE W and/or Wo Contrast  Result Date: 05/06/2020 CLINICAL DATA:  Intermittent shortness of breath and chest pain. EXAM: CT ANGIOGRAPHY CHEST WITH CONTRAST TECHNIQUE: Multidetector CT imaging of the chest was performed using the standard protocol during bolus administration of intravenous contrast. Multiplanar CT image reconstructions and MIPs were obtained to evaluate the vascular anatomy. CONTRAST:  167mL OMNIPAQUE IOHEXOL 350 MG/ML SOLN COMPARISON:  10/16/2014. FINDINGS: Cardiovascular: Image quality is degraded by respiratory motion, limiting the evaluation of the segmental and subsegmental pulmonary arteries. Otherwise, no central or lobar pulmonary embolus. Atherosclerotic calcification of the aorta, aortic valve and coronary arteries. Right and left pulmonary arteries are enlarged as is the heart. No pericardial effusion. Mediastinum/Nodes: Mediastinal adenopathy measures up to 1.8 cm in the low right paratracheal station, new. Right hilar adenopathy measures up to approximately 1.5 cm. Numerous small prepericardiac/juxtadiaphragmatic lymph nodes are new. No left hilar or axillary  adenopathy. Esophagus is grossly unremarkable. Lungs/Pleura: Image quality is markedly degraded by respiratory motion. Centrally obstructing perihilar right upper lobe masses measure approximately 3.2 x 3.4 cm (6/37) and 2.7 x 4.8 cm (6/44), respectively. Postobstructive volume loss in the right upper lobe. Peripheral nodular consolidation in the apical right upper lobe measures 1.5 x 2.2 cm (6/25). Large right pleural effusion with pleural and extrapleural nodularity and thickening. Left lung is grossly clear. Airway is otherwise grossly unremarkable. Upper  Abdomen: Visualized portions of the liver, adrenal glands, kidneys, spleen, pancreas, stomach and bowel are grossly unremarkable. Musculoskeletal: Degenerative changes in the spine. No worrisome lytic or sclerotic lesions. Old T12 compression deformity. Flowing anterior osteophytosis in the thoracic spine. Review of the MIP images confirms the above findings. IMPRESSION: 1. Image quality is degraded by expiratory phase imaging and respiratory motion, limiting the evaluation of segmental and subsegmental pulmonary arteries. Otherwise, no central or lobar pulmonary embolus. 2. 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 3. Aortic atherosclerosis (ICD10-I70.0). Coronary artery calcification. 4. Enlarged pulmonary arteries, indicative of pulmonary arterial hypertension. Electronically Signed   By: Lorin Picket M.D.   On: 05/06/2020 11:05   DG Chest Port 1 View  Result Date: 05/06/2020 CLINICAL DATA:  75 year old male status post right thoracentesis. EXAM: PORTABLE CHEST 1 VIEW COMPARISON:  Chest radiograph dated 05/06/2020 and CT dated 05/06/2020 FINDINGS: Slight interval decrease in the size of the right pleural effusion compared to  the prior radiograph. No pneumothorax. Faint linear lucency along the minor fissure was present on the prior CT and may be related to small subpleural blebs or tiny amount of air in the pleural space. Right perihilar density corresponding to the known mass. The left lung is clear. Stable cardiomediastinal silhouette. Atherosclerotic calcification of the aorta. Degenerative changes of the spine. No acute osseous pathology. IMPRESSION: Slight interval decrease in the size of the right pleural effusion status post thoracentesis. No pneumothorax. Electronically Signed   By: Anner Crete M.D.   On: 05/06/2020 16:21   US THORACENTESIS ASP PLEURAL SPACE W/IMG GUIDE  Result Date: 05/06/2020 INDICATION: Shortness of breath. Newly found lung mass with right-sided pleural effusion. Request for diagnostic and therapeutic thoracentesis EXAM: ULTRASOUND GUIDED RIGHT THORACENTESIS MEDICATIONS: 1% plain lidocaine, 10 mL COMPLICATIONS: None immediate. PROCEDURE: An ultrasound guided thoracentesis was thoroughly discussed with the patient and questions answered. The benefits, risks, alternatives and complications were also discussed. The patient understands and wishes to proceed with the procedure. Written consent was obtained. Ultrasound was performed to localize and mark an adequate pocket of fluid in the right chest. The area was then prepped and draped in the normal sterile fashion. 1% Lidocaine was used for local anesthesia. Under ultrasound guidance a 6 Fr Safe-T-Centesis catheter was introduced. Thoracentesis was performed. The catheter was removed and a dressing applied. FINDINGS: A total of approximately 1 L of blood-tinged fluid was removed. Samples were sent to the laboratory as requested by the clinical team. IMPRESSION: Successful ultrasound guided right thoracentesis yielding 1 L of pleural fluid. Read by: Ascencion Dike PA-C Electronically Signed   By: Markus Daft M.D.   On: 05/06/2020 16:11    ASSESSMENT &  PLAN:   Mass of upper lobe of right lung #Central obstructing right lung mass/upper lobe mass with pleural effusion-highly concerning for malignancy.  S/p thoracentesis; awaiting cytology  #Recommend PET scan for further evaluation/bone lesions.  Patient will likely need repeat core biopsy for-NGS testing.   #Discussed with patient that if malignancy is confirmed; unfortunately appears to be advanced age given the pleural effusion.  Discussed that patient will likely need chemotherapy/immunotherapy/ targeted therapy as part of treatment plan.  The treatment will be decided based on patient's NGS testing.  Patient also need brain MRI-given the risk of metastasis to brain.  #Patient verbalized the understanding that this is likely incurable  malignancy-for which he is open to treatment to help him live longer.  However he is significantly concerned about the potential quality of life detriment on therapies.  I assured the patient that we will do the best we can to help his quality of life during the treatments.  Also did introduce palliative care services available.  Will refer to palliative care once about diagnosis/treatment management.  #Diabetes- HbA1c- 6.7 [Sep 2021]-we will need to monitor closely on treatments.  Thank you Dr.Theis  for allowing me to participate in the care of your [place] patient. Please do not hesitate to contact me with questions or concerns in the interim.  # DISPOSITION: # PET ASAP # MRI Brain ASAP # follow up TBD- Dr.B  # I reviewed the blood work- with the patient in detail; also reviewed the imaging independently [as summarized above]; and with the patient in detail.   Addendum:Discussed with Dr. Allena Napoleon, pathology.  Cytology positive for malignant cells.  Ordered cytopathology review.    All questions were answered. The patient knows to call the clinic with any problems, questions or concerns.    Cammie Sickle, MD 05/07/2020 12:36 PM

## 2020-05-09 ENCOUNTER — Other Ambulatory Visit: Payer: Self-pay | Admitting: Anatomic Pathology & Clinical Pathology

## 2020-05-09 ENCOUNTER — Ambulatory Visit
Admission: RE | Admit: 2020-05-09 | Discharge: 2020-05-09 | Disposition: A | Discharge status: Home / Self Care | Payer: Medicare Other | Source: Ambulatory Visit | Attending: Internal Medicine | Admitting: Internal Medicine

## 2020-05-09 ENCOUNTER — Other Ambulatory Visit: Payer: Self-pay

## 2020-05-09 DIAGNOSIS — R519 Headache, unspecified: Secondary | ICD-10-CM | POA: Insufficient documentation

## 2020-05-09 DIAGNOSIS — R918 Other nonspecific abnormal finding of lung field: Secondary | ICD-10-CM | POA: Diagnosis not present

## 2020-05-09 LAB — CYTOLOGY - NON PAP

## 2020-05-09 MED ORDER — GADOBUTROL 1 MMOL/ML IV SOLN
9.0000 mL | Freq: Once | INTRAVENOUS | Status: AC | PRN
  Administered 2020-05-09: 9 mL via INTRAVENOUS

## 2020-05-10 ENCOUNTER — Telehealth: Payer: Self-pay | Admitting: Internal Medicine

## 2020-05-10 DIAGNOSIS — R918 Other nonspecific abnormal finding of lung field: Secondary | ICD-10-CM

## 2020-05-10 LAB — BODY FLUID CULTURE W GRAM STAIN: Culture: NO GROWTH

## 2020-05-10 NOTE — Telephone Encounter (Signed)
On 2/25-I spoke with patient regarding results of the pleural cytology positive for malignancy.  Brain MRI negative for-metastases however positive for bone mets/cervical spine.  Await PET scan  Recommend up-schedule appointment MD ASAP; labs- cbc/cmp;CEA.   [place]-please speak to patient/schedule-  chemotherapy education; and port referral.   GB

## 2020-05-13 ENCOUNTER — Encounter: Payer: Self-pay | Admitting: *Deleted

## 2020-05-13 ENCOUNTER — Inpatient Hospital Stay (HOSPITAL_BASED_OUTPATIENT_CLINIC_OR_DEPARTMENT_OTHER): Payer: Medicare Other | Admitting: Internal Medicine

## 2020-05-13 ENCOUNTER — Inpatient Hospital Stay: Payer: Medicare Other

## 2020-05-13 VITALS — BP 109/74 | HR 102 | Temp 98.1°F | Resp 20 | Ht 71.0 in | Wt 207.0 lb

## 2020-05-13 DIAGNOSIS — R319 Hematuria, unspecified: Secondary | ICD-10-CM

## 2020-05-13 DIAGNOSIS — R300 Dysuria: Secondary | ICD-10-CM

## 2020-05-13 DIAGNOSIS — Z7189 Other specified counseling: Secondary | ICD-10-CM

## 2020-05-13 DIAGNOSIS — C3411 Malignant neoplasm of upper lobe, right bronchus or lung: Secondary | ICD-10-CM

## 2020-05-13 DIAGNOSIS — R918 Other nonspecific abnormal finding of lung field: Secondary | ICD-10-CM

## 2020-05-13 LAB — COMPREHENSIVE METABOLIC PANEL
ALT: 14 U/L (ref 0–44)
AST: 24 U/L (ref 15–41)
Albumin: 3.4 g/dL — ABNORMAL LOW (ref 3.5–5.0)
Alkaline Phosphatase: 70 U/L (ref 38–126)
Anion gap: 10 (ref 5–15)
BUN: 12 mg/dL (ref 8–23)
CO2: 26 mmol/L (ref 22–32)
Calcium: 9.2 mg/dL (ref 8.9–10.3)
Chloride: 100 mmol/L (ref 98–111)
Creatinine, Ser: 0.69 mg/dL (ref 0.61–1.24)
GFR, Estimated: >60 mL/min (ref >60)
Glucose, Bld: 147 mg/dL — ABNORMAL HIGH (ref 70–99)
Potassium: 4.1 mmol/L (ref 3.5–5.1)
Sodium: 136 mmol/L (ref 135–145)
Total Bilirubin: 0.7 mg/dL (ref 0.3–1.2)
Total Protein: 6.8 g/dL (ref 6.5–8.1)

## 2020-05-13 LAB — URINALYSIS, COMPLETE (UACMP) WITH MICROSCOPIC
Bacteria, UA: NONE SEEN
Bilirubin Urine: NEGATIVE
Glucose, UA: NEGATIVE mg/dL
Hgb urine dipstick: NEGATIVE
Ketones, ur: NEGATIVE mg/dL
Leukocytes,Ua: NEGATIVE
Nitrite: NEGATIVE
Protein, ur: NEGATIVE mg/dL
Specific Gravity, Urine: 1.027 (ref 1.005–1.030)
Squamous Epithelial / HPF: NONE SEEN (ref 0–5)
pH: 5 (ref 5.0–8.0)

## 2020-05-13 LAB — CBC WITH DIFFERENTIAL/PLATELET
Abs Immature Granulocytes: 0.06 10*3/uL (ref 0.00–0.07)
Basophils Absolute: 0.1 10*3/uL (ref 0.0–0.1)
Basophils Relative: 1 %
Eosinophils Absolute: 0.1 10*3/uL (ref 0.0–0.5)
Eosinophils Relative: 1 %
HCT: 44.3 % (ref 39.0–52.0)
Hemoglobin: 15 g/dL (ref 13.0–17.0)
Immature Granulocytes: 1 %
Lymphocytes Relative: 27 %
Lymphs Abs: 2.1 10*3/uL (ref 0.7–4.0)
MCH: 32.1 pg (ref 26.0–34.0)
MCHC: 33.9 g/dL (ref 30.0–36.0)
MCV: 94.7 fL (ref 80.0–100.0)
Monocytes Absolute: 0.8 10*3/uL (ref 0.1–1.0)
Monocytes Relative: 11 %
Neutro Abs: 4.8 10*3/uL (ref 1.7–7.7)
Neutrophils Relative %: 59 %
Platelets: 225 10*3/uL (ref 150–400)
RBC: 4.68 MIL/uL (ref 4.22–5.81)
RDW: 12.4 % (ref 11.5–15.5)
WBC: 7.9 10*3/uL (ref 4.0–10.5)
nRBC: 0 % (ref 0.0–0.2)

## 2020-05-13 MED ORDER — PROCHLORPERAZINE MALEATE 10 MG PO TABS: 10.0000 mg | ORAL_TABLET | Freq: Four times a day (QID) | ORAL | 1 refills | Status: DC | PRN

## 2020-05-13 MED ORDER — ONDANSETRON HCL 8 MG PO TABS: ORAL_TABLET | ORAL | 1 refills | Status: DC

## 2020-05-13 MED ORDER — FOLIC ACID 1 MG PO TABS: 1.0000 mg | ORAL_TABLET | Freq: Every day | ORAL | 1 refills | Status: AC

## 2020-05-13 MED ORDER — LIDOCAINE-PRILOCAINE 2.5-2.5 % EX CREA: 1.0000 "application " | TOPICAL_CREAM | CUTANEOUS | 0 refills | Status: AC | PRN

## 2020-05-13 MED ORDER — DEXAMETHASONE 4 MG PO TABS: ORAL_TABLET | ORAL | 0 refills | Status: AC

## 2020-05-13 NOTE — Progress Notes (Signed)
START OFF PATHWAY REGIMEN - Non-Small Cell Lung   [id number]:Pembrolizumab 200 mg  IV D1 + Pemetrexed 500 mg/m2 IV D1 + Carboplatin AUC=5 IV D1 q21 Days:   A cycle is every 21 days:     Pembrolizumab      Pemetrexed      Carboplatin   **Always confirm dose/schedule in your pharmacy ordering system**  Patient Characteristics: Stage IV Metastatic, Nonsquamous, Did Not Order Molecular Analysis/Quantity Not Sufficient for Molecular Analysis Therapeutic Status: Stage IV Metastatic Histology: Nonsquamous Cell Broad Molecular Profiling Status: Quantity Not Sufficient for Molecular Analysis  Intent of Therapy: Non-Curative / Palliative Intent, Discussed with Patient

## 2020-05-13 NOTE — Progress Notes (Signed)
  Oncology Nurse Navigator Documentation  Navigator Location: [facility] (05/13/20 1500)   )Navigator Encounter Type: Follow-up Appt (05/13/20 1500)     Confirmed Diagnosis Date: 05/09/20 (05/13/20 1500)               Patient Visit Type: MedOnc (05/13/20 1500) Treatment Phase: Pre-Tx/Tx Discussion (05/13/20 1500) Barriers/Navigation Needs: Coordination of Care;Education (05/13/20 1500) Education: Newly Diagnosed Cancer Education;Understanding Cancer/ Treatment Options (05/13/20 1500) Interventions: Coordination of Care (05/13/20 1500)   Coordination of Care: Appts;Chemo;Pathology;Radiology (05/13/20 1500)        Acuity: Level 2-Minimal Needs (1-2 Barriers Identified) (05/13/20 1500)    met with patient during follow up visit with Dr. B to discuss recent pathology results and treatment options. All questions answered during visit. Pt given resources regarding diagnosis and supportive services available. Reviewed upcoming appts with pt and informed that he will be notified by phone when scheduled for port placement. Pt given contact info and instructed to call with any further questions or needs. Pt requested letter for jury duty which will be given to him when he returns on 3/3 for chemo education. Nothing further needed at this time.      Time Spent with Patient: 60 (05/13/20 1500)

## 2020-05-13 NOTE — Progress Notes (Signed)
[facility] NOTE  Patient Care Team: Ezequiel Kayser, MD as PCP - General (Internal Medicine) Telford Nab, RN as Oncology Nurse Navigator  CHIEF COMPLAINTS/PURPOSE OF CONSULTATION: lung cancer     Oncology History Overview Note  May 07, 2020- CT chest [ER]..Centrally obstructing right upper lobe masses, right upper lobe nodular consolidation, large right pleural effusion and extensive pleural/extrapleural nodularity and mediastinal/right hilar adenopathy, findings most indicative of stage IV primary bronchogenic carcinoma.s/p RIGHT Thoracentesis- MALIGNANT CELLS PRESENT.  - METASTATIC NON SMALL CELL CARCINOMA, FAVOR ADENOCARCINOMA. FEB- 2022- MRI Brain NEG for parenchymal metastases; cervical bone met. PET- P  # Enlarged pulmonary arteries, indicative of pulmonary arterial Hypertension.  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; [place]]  # NGS/MOLECULAR TESTS:    # PALLIATIVE CARE EVALUATION:  # PAIN MANAGEMENT:    DIAGNOSIS: Lung cancer  STAGE:  IV       ;  GOALS: palliative  CURRENT/MOST RECENT THERAPY :     Cancer of upper lobe of right lung ([place])  05/13/2020 Initial Diagnosis   Cancer of upper lobe of right lung ([place])   05/13/2020 Cancer Staging   Staging form: Lung, AJCC 8th Edition - Clinical: Stage IVA (cT2, cN3, pM1b) - Signed by Cammie Sickle, MD on 05/13/2020 Histopathologic type: Adenocarcinoma, NOS   05/13/2020 -  Chemotherapy    Patient is on Treatment Plan: LUNG CARBOPLATIN / PEMETREXED / PEMBROLIZUMAB Q21D INDUCTION X 4 CYCLES / MAINTENANCE PEMETREXED + PEMBROLIZUMAB         HISTORY OF PRESENTING ILLNESS:  Fred Anderson 75 y.o.  male history of smoking/right-sided pleural effusion right lung mass is here to review the results of his pleural fluid cytology/proceed with treatment plan; and to review the results of the brain MRI.  Patient is awaiting PET scan tomorrow.  Patient denies any worsening shortness of breath or  cough.  He does admit to fatigue.  Patient also have slight blood in urine.  No flank pain.  No dysuria.  Review of Systems  Constitutional: Positive for malaise/fatigue and weight loss. Negative for chills, diaphoresis and fever.  HENT: Negative for nosebleeds and sore throat.   Eyes: Negative for double vision.  Respiratory: Positive for cough and sputum production. Negative for hemoptysis, shortness of breath and wheezing.   Cardiovascular: Positive for chest pain. Negative for palpitations, orthopnea and leg swelling.  Gastrointestinal: Positive for diarrhea. Negative for abdominal pain, blood in stool, constipation, heartburn, melena, nausea and vomiting.  Genitourinary: Positive for hematuria. Negative for dysuria, frequency and urgency.  Musculoskeletal: Positive for back pain and joint pain.  Skin: Negative.  Negative for itching and rash.  Neurological: Positive for headaches. Negative for dizziness, tingling, focal weakness and weakness.  Endo/Heme/Allergies: Does not bruise/bleed easily.  Psychiatric/Behavioral: Negative for depression. The patient is not nervous/anxious and does not have insomnia.      MEDICAL HISTORY:  Past Medical History:  Diagnosis Date  . Arthritis   . Blood in semen   . BPH (benign prostatic hypertrophy)   . Cancer ([place])    melanoma on back  . DDD (degenerative disc disease), lumbar   . Diabetes mellitus without complication ([place])    TYPE 2  . Erectile dysfunction   . Hematospermia   . Hemorrhoids   . History of adenomatous polyp of colon   . History of kidney stones   . Hyperlipidemia   . Hypertension   . Kidney stones   . Obesity   . Polyneuropathy associated with underlying  disease ([place])   . Prediabetes     SURGICAL HISTORY: Past Surgical History:  Procedure Laterality Date  . BACK SURGERY  age 46   Broken back  . COLONOSCOPY WITH PROPOFOL N/A 05/05/2017   Procedure: COLONOSCOPY WITH PROPOFOL;  Surgeon: Toledo, Benay Pike, MD;   Location: [facility] ENDOSCOPY;  Service: Gastroenterology;  Laterality: N/A;  . HEMORRHOID SURGERY    . LAPAROSCOPIC APPENDECTOMY N/A 04/25/2018   Procedure: APPENDECTOMY LAPAROSCOPIC;  Surgeon: Herbert Pun, MD;  Location: [facility] ORS;  Service: General;  Laterality: N/A;  . SHOULDER ARTHROSCOPY WITH OPEN ROTATOR CUFF REPAIR Right 04/03/2019   Procedure: SHOULDER ARTHROSCOPY WITH SUBSACAPULARIS REPAIR, OPEN ROTATOR CUFF REPAIR, SUBARCOMIAL DECOMPRESSION, BICEPS TENODESIS;  Surgeon: Leim Fabry, MD;  Location: [facility] ORS;  Service: Orthopedics;  Laterality: Right;  . SMALL INTESTINE SURGERY      SOCIAL HISTORY: Social History   Socioeconomic History  . Marital status: Married    Spouse name: Not on file  . Number of children: Not on file  . Years of education: Not on file  . Highest education level: Not on file  Occupational History  . Not on file  Tobacco Use  . Smoking status: Former Smoker    Quit date: 08/15/1988    Years since quitting: 31.7  . Smokeless tobacco: Never Used  . Tobacco comment: quit september 11th 1991  Vaping Use  . Vaping Use: Never used  Substance and Sexual Activity  . Alcohol use: Yes    Alcohol/week: 12.0 standard drinks    Types: 12 Cans of beer per week  . Drug use: No  . Sexual activity: Not on file  Other Topics Concern  . Not on file  Social History Narrative   Quit smoking in 1991; used to smoke 3 ppd. Lives in [place]; with wife; and 2 daughters. 2-3 drink 4/ week. Worked in Tourist information centre manager; retired in 2013; raised cattle.    Social Determinants of Health   Financial Resource Strain: Not on file  Food Insecurity: Not on file  Transportation Needs: Not on file  Physical Activity: Not on file  Stress: Not on file  Social Connections: Not on file  Intimate Partner Violence: Not on file    FAMILY HISTORY: Family History  Problem Relation Age of Onset  . Colon cancer Father   . Cancer Mother   . Liver cancer Sister   . Kidney cancer  Brother   . Bone cancer Brother   . Kidney disease Neg Hx   . Prostate cancer Neg Hx   . Bladder Cancer Neg Hx     ALLERGIES:  is allergic to atorvastatin.  MEDICATIONS:  Current Outpatient Medications  Medication Sig Dispense Refill  . aspirin 325 MG tablet Take 325 mg by mouth daily.    Marland Kitchen dexamethasone (DECADRON) 4 MG tablet Take one pill AM & PM x 2 days; one day before and one day after chemo. None on the day of chemo. 60 tablet 0  . folic acid (FOLVITE) 1 MG tablet Take 1 tablet (1 mg total) by mouth daily. 90 tablet 1  . lidocaine-prilocaine (EMLA) cream Apply 1 application topically as needed. 30 g 0  . lovastatin (MEVACOR) 40 MG tablet Take 40 mg by mouth at bedtime.   0  . metFORMIN (GLUCOPHAGE) 500 MG tablet Take 1,000 mg by mouth 2 (two) times daily.    . Multiple Vitamin (MULTIVITAMIN WITH MINERALS) TABS tablet Take 1 tablet by mouth daily.    . Omega-3 Fatty Acids (FISH  OIL) 500 MG CAPS Take 500 mg by mouth 2 (two) times daily.     . ondansetron (ZOFRAN) 8 MG tablet One pill every 8 hours as needed for nausea/vomitting. 40 tablet 1  . prochlorperazine (COMPAZINE) 10 MG tablet Take 1 tablet (10 mg total) by mouth every 6 (six) hours as needed for nausea or vomiting. 40 tablet 1  . tamsulosin (FLOMAX) 0.4 MG CAPS capsule Take 1 capsule (0.4 mg total) by mouth daily. 90 capsule 3   No current facility-administered medications for this visit.      Marland Kitchen  PHYSICAL EXAMINATION: ECOG PERFORMANCE STATUS: 1 - Symptomatic but completely ambulatory  Vitals:   05/13/20 1338  BP: 109/74  Pulse: (!) 102  Resp: 20  Temp: 98.1 F (36.7 C)   Filed Weights   05/13/20 1338  Weight: 207 lb (93.9 kg)    Physical Exam Constitutional:      Comments: Ambulating independently.  Accompanied by his wife.  HENT:     Head: Normocephalic and atraumatic.     Mouth/Throat:     Pharynx: No oropharyngeal exudate.  Eyes:     Pupils: Pupils are equal, round, and reactive to light.   Cardiovascular:     Rate and Rhythm: Normal rate and regular rhythm.  Pulmonary:     Effort: No respiratory distress.     Breath sounds: No wheezing.     Comments: Decreased breath on the right side compared to the left.  No wheeze or crackles. Abdominal:     General: Bowel sounds are normal. There is no distension.     Palpations: Abdomen is soft. There is no mass.     Tenderness: There is no abdominal tenderness. There is no guarding or rebound.  Musculoskeletal:        General: No tenderness. Normal range of motion.     Cervical back: Normal range of motion and neck supple.  Skin:    General: Skin is warm.  Neurological:     Mental Status: He is alert and oriented to person, place, and time.  Psychiatric:        Mood and Affect: Affect normal.      LABORATORY DATA:  I have reviewed the data as listed Lab Results  Component Value Date   WBC 7.9 05/13/2020   HGB 15.0 05/13/2020   HCT 44.3 05/13/2020   MCV 94.7 05/13/2020   PLT 225 05/13/2020   Recent Labs    05/06/20 0916 05/13/20 1308  NA 135 136  K 4.1 4.1  CL 101 100  CO2 22 26  GLUCOSE 186* 147*  BUN 11 12  CREATININE 0.69 0.69  CALCIUM 9.4 9.2  GFRNONAA >60 >60  PROT  --  6.8  ALBUMIN  --  3.4*  AST  --  24  ALT  --  14  ALKPHOS  --  70  BILITOT  --  0.7    RADIOGRAPHIC STUDIES: I have personally reviewed the radiological images as listed and agreed with the findings in the report. DG Chest 2 View  Result Date: 05/06/2020 CLINICAL DATA:  Intermittent shortness of breath EXAM: CHEST - 2 VIEW COMPARISON:  2016 FINDINGS: Right pleural effusion. Likely partially loculated extending along the chest wall laterally. There is also fissural fluid. Adjacent right lung atelectasis. Left lung is clear. Heart size is normal. No acute osseous abnormality. IMPRESSION: Likely partially loculated small to moderate right pleural effusion. Adjacent atelectasis. Electronically Signed   By: Macy Mis M.D.   On:  05/06/2020 10:00   CT Angio Chest PE W and/or Wo Contrast  Result Date: 05/06/2020 CLINICAL DATA:  Intermittent shortness of breath and chest pain. EXAM: CT ANGIOGRAPHY CHEST WITH CONTRAST TECHNIQUE: Multidetector CT imaging of the chest was performed using the standard protocol during bolus administration of intravenous contrast. Multiplanar CT image reconstructions and MIPs were obtained to evaluate the vascular anatomy. CONTRAST:  187m OMNIPAQUE IOHEXOL 350 MG/ML SOLN COMPARISON:  10/16/2014. FINDINGS: Cardiovascular: Image quality is degraded by respiratory motion, limiting the evaluation of the segmental and subsegmental pulmonary arteries. Otherwise, no central or lobar pulmonary embolus. Atherosclerotic calcification of the aorta, aortic valve and coronary arteries. Right and left pulmonary arteries are enlarged as is the heart. No pericardial effusion. Mediastinum/Nodes: Mediastinal adenopathy measures up to 1.8 cm in the low right paratracheal station, new. Right hilar adenopathy measures up to approximately 1.5 cm. Numerous small prepericardiac/juxtadiaphragmatic lymph nodes are new. No left hilar or axillary adenopathy. Esophagus is grossly unremarkable. Lungs/Pleura: Image quality is markedly degraded by respiratory motion. Centrally obstructing perihilar right upper lobe masses measure approximately 3.2 x 3.4 cm (6/37) and 2.7 x 4.8 cm (6/44), respectively. Postobstructive volume loss in the right upper lobe. Peripheral nodular consolidation in the apical right upper lobe measures 1.5 x 2.2 cm (6/25). Large right pleural effusion with pleural and extrapleural nodularity and thickening. Left lung is grossly clear. Airway is otherwise grossly unremarkable. Upper Abdomen: Visualized portions of the liver, adrenal glands, kidneys, spleen, pancreas, stomach and bowel are grossly unremarkable. Musculoskeletal: Degenerative changes in the spine. No worrisome lytic or sclerotic lesions. Old T12 compression  deformity. Flowing anterior osteophytosis in the thoracic spine. Review of the MIP images confirms the above findings. IMPRESSION: 1. Image quality is degraded by expiratory phase imaging and respiratory motion, limiting the evaluation of segmental and subsegmental pulmonary arteries. Otherwise, no central or lobar pulmonary embolus. 2. 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 3. Aortic atherosclerosis (ICD10-I70.0). Coronary artery calcification. 4. Enlarged pulmonary arteries, indicative of pulmonary arterial hypertension. Electronically Signed   By: MLorin PicketM.D.   On: 05/06/2020 11:05   MR Brain W Wo Contrast  Result Date: 05/10/2020 CLINICAL DATA:  Non-small cell lung cancer.  Staging. EXAM: MRI HEAD WITHOUT AND WITH CONTRAST TECHNIQUE: Multiplanar, multiecho pulse sequences of the brain and surrounding structures were obtained without and with intravenous contrast. CONTRAST:  913mGADAVIST GADOBUTROL 1 MMOL/ML IV SOLN COMPARISON:  Head CT October 16, 2014. FINDINGS: Brain: No acute infarction, hemorrhage, hydrocephalus, extra-axial collection or mass lesion. A few scattered foci of T2 hyperintensity within the white matter of the cerebral hemispheres, nonspecific, most likely related to mild chronic microangiopathic changes. Mild parenchymal volume loss. No focus of abnormal contrast enhancement. Vascular: Normal flow voids. Skull and upper cervical spine: T1 hypointense lesion within the clivus with mild contrast enhancement. Sinuses/Orbits: Minimal mucosal thickening of the sphenoid sinuses. The orbits are maintained. IMPRESSION: 1. T1 hypointense lesion within the clivus with mild contrast enhancement. This may represent a metastatic lesion. No evidence of meningeal or  parenchymal metastatic disease. 2. Mild chronic microangiopathic changes and parenchymal volume loss. Electronically Signed   By: KaPedro Earls.D.   On: 05/10/2020 10:37   DG Chest Port 1 View  Result Date: 05/06/2020 CLINICAL DATA:  89ear old male status post right thoracentesis. EXAM: PORTABLE CHEST 1 VIEW COMPARISON:  Chest radiograph dated 05/06/2020 and CT dated 05/06/2020 FINDINGS: Slight interval decrease in the size of the right pleural effusion compared to the prior radiograph. No pneumothorax. Faint linear lucency along the minor fissure was present on the prior CT and may be related to small subpleural blebs or tiny amount of air in the pleural space. Right perihilar density corresponding to the known mass. The left lung is clear. Stable cardiomediastinal silhouette. Atherosclerotic calcification of the aorta. Degenerative changes of the spine. No acute osseous pathology. IMPRESSION: Slight interval decrease in the size of the right pleural effusion status post thoracentesis. No pneumothorax. Electronically Signed   By: Anner Crete M.D.   On: 05/06/2020 16:21   US THORACENTESIS ASP PLEURAL SPACE W/IMG GUIDE  Result Date: 05/06/2020 INDICATION: Shortness of breath. Newly found lung mass with right-sided pleural effusion. Request for diagnostic and therapeutic thoracentesis EXAM: ULTRASOUND GUIDED RIGHT THORACENTESIS MEDICATIONS: 1% plain lidocaine, 10 mL COMPLICATIONS: None immediate. PROCEDURE: An ultrasound guided thoracentesis was thoroughly discussed with the patient and questions answered. The benefits, risks, alternatives and complications were also discussed. The patient understands and wishes to proceed with the procedure. Written consent was obtained. Ultrasound was performed to localize and mark an adequate pocket of fluid in the right chest. The area was then prepped and draped in the normal sterile fashion. 1% Lidocaine was used for local anesthesia. Under  ultrasound guidance a 6 Fr Safe-T-Centesis catheter was introduced. Thoracentesis was performed. The catheter was removed and a dressing applied. FINDINGS: A total of approximately 1 L of blood-tinged fluid was removed. Samples were sent to the laboratory as requested by the clinical team. IMPRESSION: Successful ultrasound guided right thoracentesis yielding 1 L of pleural fluid. Read by: Ascencion Dike PA-C Electronically Signed   By: Markus Daft M.D.   On: 05/06/2020 16:11    ASSESSMENT & PLAN:   Cancer of upper lobe of right lung ([facility]) #Central obstructing right lung mass/upper lobe mass with pleural effusion- S/p thoracentesis; cytology positive for non-small cell lung CA-adenocarcinoma.  Await PET scan; check lipid biopsy today.  #Reviewed the pathology; stage of cancer.  Discussed treatments are palliative not curative.  While awaiting NGS-repeat biopsy/liquid biopsy-I would recommend starting chemotherapy carbo Alimta; add Keytruda if targetable mutations are negative.   Discussed the potential side effects including but not limited to-increasing fatigue, nausea vomiting, diarrhea, hair loss, sores in the mouth, increase risk of infection and also neuropathy.    I discussed the mechanism of action; The goal of therapy is palliative; and length of treatments are likely ongoing/based upon the results of the scans. Discussed the potential side effects of immunotherapy including but not limited to diarrhea; skin rash; elevated LFTs/endocrine abnormalities etc.   # ?  Bone metastasis noted on MRI brain-cervical spine await PET scan.  If metastasis present would recommend Zometa.  # Hematuria- check UA; prior urology  #Diabetes- HbA1c- 6.7 [Sep 2021]-we will need to monitor closely on treatments.;  Especially on dexamethasone around chemotherapy.  Prognosis/treatment plan: Again discussed with the patient the goal of therapy is palliative not curative.  The median life expectancy with treatment  is anywhere approximately 18 months.  Patient concerned about quality of life on therapy.  Discussed that at any point of time patient feels his quality of life is diminished/declines; he has absolute freedom to stop chemotherapy.  Recommend palliative care evaluation at next visit. Called in dex/zofran/compazine/folic acid/emla.   * Hold [place].  # DISPOSITION: # B12 injection with chemo education #  port placement IR- # Palliative care evaluation at next visit # in 10 days-MD; labs CBC CMP; Guradant testing carbo Alimta;Dr.B    All questions were answered. The patient knows to call the clinic with any problems, questions or concerns.    Cammie Sickle, MD 05/13/2020 7:21 PM

## 2020-05-13 NOTE — Addendum Note (Signed)
Addended by: Delice Bison E on: 05/13/2020 08:59 AM   Modules accepted: Orders

## 2020-05-13 NOTE — Assessment & Plan Note (Addendum)
#  Central obstructing right lung mass/upper lobe mass with pleural effusion- S/p thoracentesis; cytology positive for non-small cell lung CA-adenocarcinoma.  Await PET scan; check lipid biopsy today.  #Reviewed the pathology; stage of cancer.  Discussed treatments are palliative not curative.  While awaiting NGS-repeat biopsy/liquid biopsy-I would recommend starting chemotherapy carbo Alimta; add Keytruda if targetable mutations are negative.   Discussed the potential side effects including but not limited to-increasing fatigue, nausea vomiting, diarrhea, hair loss, sores in the mouth, increase risk of infection and also neuropathy.    I discussed the mechanism of action; The goal of therapy is palliative; and length of treatments are likely ongoing/based upon the results of the scans. Discussed the potential side effects of immunotherapy including but not limited to diarrhea; skin rash; elevated LFTs/endocrine abnormalities etc.   # ?  Bone metastasis noted on MRI brain-cervical spine await PET scan.  If metastasis present would recommend Zometa.  # Hematuria- check UA; prior urology  #Diabetes- HbA1c- 6.7 [Sep 2021]-we will need to monitor closely on treatments.;  Especially on dexamethasone around chemotherapy.  Prognosis/treatment plan: Again discussed with the patient the goal of therapy is palliative not curative.  The median life expectancy with treatment is anywhere approximately 18 months.  Patient concerned about quality of life on therapy.  Discussed that at any point of time patient feels his quality of life is diminished/declines; he has absolute freedom to stop chemotherapy.  Recommend palliative care evaluation at next visit. Called in dex/zofran/compazine/folic acid/emla.   * Hold [place].  # DISPOSITION: # B12 injection with chemo education # port placement IR- # Palliative care evaluation at next visit # in 10 days-MD; labs CBC CMP; Guradant testing carbo Alimta;Dr.B

## 2020-05-13 NOTE — Addendum Note (Signed)
Addended by: Gloris Ham on: 05/13/2020 09:51 AM   Modules accepted: Orders

## 2020-05-14 ENCOUNTER — Other Ambulatory Visit: Payer: Self-pay

## 2020-05-14 ENCOUNTER — Inpatient Hospital Stay: Payer: Medicare Other

## 2020-05-14 ENCOUNTER — Telehealth: Payer: Self-pay | Admitting: *Deleted

## 2020-05-14 ENCOUNTER — Ambulatory Visit
Admission: RE | Admit: 2020-05-14 | Discharge: 2020-05-14 | Disposition: A | Discharge status: Home / Self Care | Payer: Medicare Other | Source: Ambulatory Visit | Attending: Internal Medicine | Admitting: Internal Medicine

## 2020-05-14 DIAGNOSIS — C3490 Malignant neoplasm of unspecified part of unspecified bronchus or lung: Secondary | ICD-10-CM | POA: Diagnosis not present

## 2020-05-14 DIAGNOSIS — C782 Secondary malignant neoplasm of pleura: Secondary | ICD-10-CM | POA: Insufficient documentation

## 2020-05-14 DIAGNOSIS — J90 Pleural effusion, not elsewhere classified: Secondary | ICD-10-CM | POA: Insufficient documentation

## 2020-05-14 DIAGNOSIS — R918 Other nonspecific abnormal finding of lung field: Secondary | ICD-10-CM | POA: Diagnosis present

## 2020-05-14 LAB — CEA: CEA: 9.3 ng/mL — ABNORMAL HIGH (ref 0.0–4.7)

## 2020-05-14 LAB — GLUCOSE, CAPILLARY: Glucose-Capillary: 106 mg/dL — ABNORMAL HIGH (ref 70–99)

## 2020-05-14 MED ORDER — FLUDEOXYGLUCOSE F - 18 (FDG) INJECTION
13.0000 | Freq: Once | INTRAVENOUS | Status: AC | PRN
  Administered 2020-05-14: 12.94 via INTRAVENOUS

## 2020-05-14 NOTE — Progress Notes (Signed)
Called and spoke with patient regarding procedure scheduled 05/17/2020.  Discussed NPO after midnight day of procedure, no diabetic medicine moring of procedure, arrive at 10am, responsible driver, and patient verbalized understanding of NPO after midnight of procedure, no diabetic med morning of procedure, arrival time 10am and patient states his wife will accompany him.

## 2020-05-14 NOTE — Telephone Encounter (Signed)
Spoke with patient. Port placement set up for 05/17/20 (with arrival at 10 am for an 11 am apt time).

## 2020-05-15 NOTE — Patient Instructions (Signed)
Pembrolizumab injection What is this medicine? PEMBROLIZUMAB (pem broe liz ue mab) is a monoclonal antibody. It is used to treat certain types of cancer. This medicine may be used for other purposes; ask your health care provider or pharmacist if you have questions. COMMON BRAND NAME(S): Keytruda What should I tell my health care provider before I take this medicine? They need to know if you have any of these conditions:  autoimmune diseases like Crohn's disease, ulcerative colitis, or lupus  have had or planning to have an allogeneic stem cell transplant (uses someone else's stem cells)  history of organ transplant  history of chest radiation  nervous system problems like myasthenia gravis or Guillain-Barre syndrome  an unusual or allergic reaction to pembrolizumab, other medicines, foods, dyes, or preservatives  pregnant or trying to get pregnant  breast-feeding How should I use this medicine? This medicine is for infusion into a vein. It is given by a health care professional in a hospital or clinic setting. A special MedGuide will be given to you before each treatment. Be sure to read this information carefully each time. Talk to your pediatrician regarding the use of this medicine in children. While this drug may be prescribed for children as young as 6 months for selected conditions, precautions do apply. Overdosage: If you think you have taken too much of this medicine contact a poison control center or emergency room at once. NOTE: This medicine is only for you. Do not share this medicine with others. What if I miss a dose? It is important not to miss your dose. Call your doctor or health care professional if you are unable to keep an appointment. What may interact with this medicine? Interactions have not been studied. This list may not describe all possible interactions. Give your health care provider a list of all the medicines, herbs, non-prescription drugs, or dietary  supplements you use. Also tell them if you smoke, drink alcohol, or use illegal drugs. Some items may interact with your medicine. What should I watch for while using this medicine? Your condition will be monitored carefully while you are receiving this medicine. You may need blood work done while you are taking this medicine. Do not become pregnant while taking this medicine or for 4 months after stopping it. Women should inform their doctor if they wish to become pregnant or think they might be pregnant. There is a potential for serious side effects to an unborn child. Talk to your health care professional or pharmacist for more information. Do not breast-feed an infant while taking this medicine or for 4 months after the last dose. What side effects may I notice from receiving this medicine? Side effects that you should report to your doctor or health care professional as soon as possible:  allergic reactions like skin rash, itching or hives, swelling of the face, lips, or tongue  bloody or black, tarry  breathing problems  changes in vision  chest pain  chills  confusion  constipation  cough  diarrhea  dizziness or feeling faint or lightheaded  fast or irregular heartbeat  fever  flushing  joint pain  low blood counts - this medicine may decrease the number of white blood cells, red blood cells and platelets. You may be at increased risk for infections and bleeding.  muscle pain  muscle weakness  pain, tingling, numbness in the hands or feet  persistent headache  redness, blistering, peeling or loosening of the skin, including inside the mouth  signs and   symptoms of high blood sugar such as dizziness; dry mouth; dry skin; fruity breath; nausea; stomach pain; increased hunger or thirst; increased urination  signs and symptoms of kidney injury like trouble passing urine or change in the amount of urine  signs and symptoms of liver injury like dark urine,  light-colored stools, loss of appetite, nausea, right upper belly pain, yellowing of the eyes or skin  sweating  swollen lymph nodes  weight loss Side effects that usually do not require medical attention (report to your doctor or health care professional if they continue or are bothersome):  decreased appetite  hair loss  tiredness This list may not describe all possible side effects. Call your doctor for medical advice about side effects. You may report side effects to FDA at 1-800-FDA-1088. Where should I keep my medicine? This drug is given in a hospital or clinic and will not be stored at home. NOTE: This sheet is a summary. It may not cover all possible information. If you have questions about this medicine, talk to your doctor, pharmacist, or health care provider.  2021 Elsevier/Gold Standard (2019-02-01 21:44:53) Pemetrexed injection What is this medicine? PEMETREXED (PEM e TREX ed) is a chemotherapy drug used to treat lung cancers like non-small cell lung cancer and mesothelioma. It may also be used to treat other cancers. This medicine may be used for other purposes; ask your health care provider or pharmacist if you have questions. COMMON BRAND NAME(S): Alimta What should I tell my health care provider before I take this medicine? They need to know if you have any of these conditions:  infection (especially a virus infection such as chickenpox, cold sores, or herpes)  kidney disease  low blood counts, like low white cell, platelet, or red cell counts  lung or breathing disease, like asthma  radiation therapy  an unusual or allergic reaction to pemetrexed, other medicines, foods, dyes, or preservative  pregnant or trying to get pregnant  breast-feeding How should I use this medicine? This drug is given as an infusion into a vein. It is administered in a hospital or clinic by a specially trained health care professional. Talk to your pediatrician regarding the  use of this medicine in children. Special care may be needed. Overdosage: If you think you have taken too much of this medicine contact a poison control center or emergency room at once. NOTE: This medicine is only for you. Do not share this medicine with others. What if I miss a dose? It is important not to miss your dose. Call your doctor or health care professional if you are unable to keep an appointment. What may interact with this medicine? This medicine may interact with the following medications:  Ibuprofen This list may not describe all possible interactions. Give your health care provider a list of all the medicines, herbs, non-prescription drugs, or dietary supplements you use. Also tell them if you smoke, drink alcohol, or use illegal drugs. Some items may interact with your medicine. What should I watch for while using this medicine? Visit your doctor for checks on your progress. This drug may make you feel generally unwell. This is not uncommon, as chemotherapy can affect healthy cells as well as cancer cells. Report any side effects. Continue your course of treatment even though you feel ill unless your doctor tells you to stop. In some cases, you may be given additional medicines to help with side effects. Follow all directions for their use. Call your doctor or health care   professional for advice if you get a fever, chills or sore throat, or other symptoms of a cold or flu. Do not treat yourself. This drug decreases your body's ability to fight infections. Try to avoid being around people who are sick. This medicine may increase your risk to bruise or bleed. Call your doctor or health care professional if you notice any unusual bleeding. Be careful brushing and flossing your teeth or using a toothpick because you may get an infection or bleed more easily. If you have any dental work done, tell your dentist you are receiving this medicine. Avoid taking products that contain aspirin,  acetaminophen, ibuprofen, naproxen, or ketoprofen unless instructed by your doctor. These medicines may hide a fever. Call your doctor or health care professional if you get diarrhea or mouth sores. Do not treat yourself. To protect your kidneys, drink water or other fluids as directed while you are taking this medicine. Do not become pregnant while taking this medicine or for 6 months after stopping it. Women should inform their doctor if they wish to become pregnant or think they might be pregnant. Men should not father a child while taking this medicine and for 3 months after stopping it. This may interfere with the ability to father a child. You should talk to your doctor or health care professional if you are concerned about your fertility. There is a potential for serious side effects to an unborn child. Talk to your health care professional or pharmacist for more information. Do not breast-feed an infant while taking this medicine or for 1 week after stopping it. What side effects may I notice from receiving this medicine? Side effects that you should report to your doctor or health care professional as soon as possible:  allergic reactions like skin rash, itching or hives, swelling of the face, lips, or tongue  breathing problems  redness, blistering, peeling or loosening of the skin, including inside the mouth  signs and symptoms of bleeding such as bloody or black, tarry stools; red or dark-brown urine; spitting up blood or brown material that looks like coffee grounds; red spots on the skin; unusual bruising or bleeding from the eye, gums, or nose  signs and symptoms of infection like fever or chills; cough; sore throat; pain or trouble passing urine  signs and symptoms of kidney injury like trouble passing urine or change in the amount of urine  signs and symptoms of liver injury like dark yellow or brown urine; general ill feeling or flu-like symptoms; light-colored stools; loss of  appetite; nausea; right upper belly pain; unusually weak or tired; yellowing of the eyes or skin Side effects that usually do not require medical attention (report to your doctor or health care professional if they continue or are bothersome):  constipation  mouth sores  nausea, vomiting  unusually weak or tired This list may not describe all possible side effects. Call your doctor for medical advice about side effects. You may report side effects to FDA at 1-800-FDA-1088. Where should I keep my medicine? This drug is given in a hospital or clinic and will not be stored at home. NOTE: This sheet is a summary. It may not cover all possible information. If you have questions about this medicine, talk to your doctor, pharmacist, or health care provider.  2021 Elsevier/Gold Standard (2017-04-21 16:11:33) Carboplatin injection What is this medicine? CARBOPLATIN (KAR boe pla tin) is a chemotherapy drug. It targets fast dividing cells, like cancer cells, and causes these cells to   die. This medicine is used to treat ovarian cancer and many other cancers. This medicine may be used for other purposes; ask your health care provider or pharmacist if you have questions. COMMON BRAND NAME(S): Paraplatin What should I tell my health care provider before I take this medicine? They need to know if you have any of these conditions:  blood disorders  hearing problems  kidney disease  recent or ongoing radiation therapy  an unusual or allergic reaction to carboplatin, cisplatin, other chemotherapy, other medicines, foods, dyes, or preservatives  pregnant or trying to get pregnant  breast-feeding How should I use this medicine? This drug is usually given as an infusion into a vein. It is administered in a hospital or clinic by a specially trained health care professional. Talk to your pediatrician regarding the use of this medicine in children. Special care may be needed. Overdosage: If you think  you have taken too much of this medicine contact a poison control center or emergency room at once. NOTE: This medicine is only for you. Do not share this medicine with others. What if I miss a dose? It is important not to miss a dose. Call your doctor or health care professional if you are unable to keep an appointment. What may interact with this medicine?  medicines for seizures  medicines to increase blood counts like filgrastim, pegfilgrastim, sargramostim  some antibiotics like amikacin, gentamicin, neomycin, streptomycin, tobramycin  vaccines Talk to your doctor or health care professional before taking any of these medicines:  acetaminophen  aspirin  ibuprofen  ketoprofen  naproxen This list may not describe all possible interactions. Give your health care provider a list of all the medicines, herbs, non-prescription drugs, or dietary supplements you use. Also tell them if you smoke, drink alcohol, or use illegal drugs. Some items may interact with your medicine. What should I watch for while using this medicine? Your condition will be monitored carefully while you are receiving this medicine. You will need important blood work done while you are taking this medicine. This drug may make you feel generally unwell. This is not uncommon, as chemotherapy can affect healthy cells as well as cancer cells. Report any side effects. Continue your course of treatment even though you feel ill unless your doctor tells you to stop. In some cases, you may be given additional medicines to help with side effects. Follow all directions for their use. Call your doctor or health care professional for advice if you get a fever, chills or sore throat, or other symptoms of a cold or flu. Do not treat yourself. This drug decreases your body's ability to fight infections. Try to avoid being around people who are sick. This medicine may increase your risk to bruise or bleed. Call your doctor or health  care professional if you notice any unusual bleeding. Be careful brushing and flossing your teeth or using a toothpick because you may get an infection or bleed more easily. If you have any dental work done, tell your dentist you are receiving this medicine. Avoid taking products that contain aspirin, acetaminophen, ibuprofen, naproxen, or ketoprofen unless instructed by your doctor. These medicines may hide a fever. Do not become pregnant while taking this medicine. Women should inform their doctor if they wish to become pregnant or think they might be pregnant. There is a potential for serious side effects to an unborn child. Talk to your health care professional or pharmacist for more information. Do not breast-feed an infant while taking   this medicine. What side effects may I notice from receiving this medicine? Side effects that you should report to your doctor or health care professional as soon as possible:  allergic reactions like skin rash, itching or hives, swelling of the face, lips, or tongue  signs of infection - fever or chills, cough, sore throat, pain or difficulty passing urine  signs of decreased platelets or bleeding - bruising, pinpoint red spots on the skin, black, tarry stools, nosebleeds  signs of decreased red blood cells - unusually weak or tired, fainting spells, lightheadedness  breathing problems  changes in hearing  changes in vision  chest pain  high blood pressure  low blood counts - This drug may decrease the number of white blood cells, red blood cells and platelets. You may be at increased risk for infections and bleeding.  nausea and vomiting  pain, swelling, redness or irritation at the injection site  pain, tingling, numbness in the hands or feet  problems with balance, talking, walking  trouble passing urine or change in the amount of urine Side effects that usually do not require medical attention (report to your doctor or health care  professional if they continue or are bothersome):  hair loss  loss of appetite  metallic taste in the mouth or changes in taste This list may not describe all possible side effects. Call your doctor for medical advice about side effects. You may report side effects to FDA at 1-800-FDA-1088. Where should I keep my medicine? This drug is given in a hospital or clinic and will not be stored at home. NOTE: This sheet is a summary. It may not cover all possible information. If you have questions about this medicine, talk to your doctor, pharmacist, or health care provider.  2021 Elsevier/Gold Standard (2007-06-07 14:38:05)  

## 2020-05-16 ENCOUNTER — Inpatient Hospital Stay: Payer: Medicare Other | Attending: Internal Medicine

## 2020-05-16 ENCOUNTER — Inpatient Hospital Stay: Payer: Medicare Other

## 2020-05-16 ENCOUNTER — Other Ambulatory Visit: Payer: Self-pay | Admitting: Radiology

## 2020-05-16 ENCOUNTER — Other Ambulatory Visit: Payer: Medicare Other

## 2020-05-16 DIAGNOSIS — C3411 Malignant neoplasm of upper lobe, right bronchus or lung: Secondary | ICD-10-CM | POA: Insufficient documentation

## 2020-05-16 DIAGNOSIS — C7951 Secondary malignant neoplasm of bone: Secondary | ICD-10-CM | POA: Insufficient documentation

## 2020-05-16 DIAGNOSIS — E785 Hyperlipidemia, unspecified: Secondary | ICD-10-CM | POA: Diagnosis not present

## 2020-05-16 DIAGNOSIS — Z86010 Personal history of colonic polyps: Secondary | ICD-10-CM | POA: Insufficient documentation

## 2020-05-16 DIAGNOSIS — G629 Polyneuropathy, unspecified: Secondary | ICD-10-CM | POA: Diagnosis not present

## 2020-05-16 DIAGNOSIS — Z5111 Encounter for antineoplastic chemotherapy: Secondary | ICD-10-CM | POA: Insufficient documentation

## 2020-05-16 DIAGNOSIS — Z85820 Personal history of malignant melanoma of skin: Secondary | ICD-10-CM | POA: Diagnosis not present

## 2020-05-16 DIAGNOSIS — Z87891 Personal history of nicotine dependence: Secondary | ICD-10-CM | POA: Insufficient documentation

## 2020-05-16 DIAGNOSIS — J90 Pleural effusion, not elsewhere classified: Secondary | ICD-10-CM | POA: Diagnosis not present

## 2020-05-16 DIAGNOSIS — N400 Enlarged prostate without lower urinary tract symptoms: Secondary | ICD-10-CM | POA: Diagnosis not present

## 2020-05-16 DIAGNOSIS — Z7982 Long term (current) use of aspirin: Secondary | ICD-10-CM | POA: Diagnosis not present

## 2020-05-16 DIAGNOSIS — Z7984 Long term (current) use of oral hypoglycemic drugs: Secondary | ICD-10-CM | POA: Diagnosis not present

## 2020-05-16 DIAGNOSIS — Z79899 Other long term (current) drug therapy: Secondary | ICD-10-CM | POA: Diagnosis not present

## 2020-05-16 DIAGNOSIS — I700 Atherosclerosis of aorta: Secondary | ICD-10-CM | POA: Diagnosis not present

## 2020-05-16 DIAGNOSIS — J910 Malignant pleural effusion: Secondary | ICD-10-CM | POA: Insufficient documentation

## 2020-05-16 DIAGNOSIS — E119 Type 2 diabetes mellitus without complications: Secondary | ICD-10-CM | POA: Diagnosis not present

## 2020-05-16 DIAGNOSIS — I2721 Secondary pulmonary arterial hypertension: Secondary | ICD-10-CM | POA: Insufficient documentation

## 2020-05-16 DIAGNOSIS — Z87442 Personal history of urinary calculi: Secondary | ICD-10-CM | POA: Diagnosis not present

## 2020-05-16 DIAGNOSIS — E669 Obesity, unspecified: Secondary | ICD-10-CM | POA: Insufficient documentation

## 2020-05-16 MED ORDER — CYANOCOBALAMIN 1000 MCG/ML IJ SOLN
1000.0000 ug | Freq: Once | INTRAMUSCULAR | Status: AC
  Administered 2020-05-16: 1000 ug via INTRAMUSCULAR
  Filled 2020-05-16: qty 1

## 2020-05-16 NOTE — Research (Signed)
Trial:  [facility] Protocol Patient Fred Anderson was identified by Jeral Fruit, RN, research nurse, as a potential candidate for the above listed study.  This Clinical Research Nurse met with Fred Anderson, [id number], on 05/16/20 in a manner and location that ensures patient privacy to discuss participation in the above listed research study.  Patient is Accompanied by his spouse.  A copy of the informed consent document and separate HIPAA Authorization was provided to the patient.  Patient reads, speaks, and understands [place].   Patient was provided with the business card of this Nurse and encouraged to contact the research team with any questions.  Approximately 15 minutes were spent with the patient reviewing the informed consent documents.  Patient was provided the option of taking informed consent documents home to review and was encouraged to review at their convenience with their support network, including other care providers. Patient took the consent documents home to review. The research nurse will follow up with the patient in the next couple of days for his interest in participating in the protocol. Jeral Fruit, RN 05/16/20 2:22 PM

## 2020-05-16 NOTE — Progress Notes (Signed)
Tumor Board Documentation  ANDONI BUSCH was presented by Dr Rogue Bussing at our Tumor Board on 05/16/2020, which included representatives from medical oncology,radiation oncology,internal medicine,navigation,pathology,radiology,surgical,pharmacy,genetics,research,palliative care,pulmonology.  Tayvion currently presents as a new patient,for MDC,for new positive pathology with history of the following treatments: surgical intervention(s).  Additionally, we reviewed previous medical and familial history, history of present illness, and recent lab results along with all available histopathologic and imaging studies. The tumor board considered available treatment options and made the following recommendations: Immunotherapy,Neoadjuvant chemotherapy,Additional screening Send liquid biopsy for NGS testing, MRI with contrast of spine  The following procedures/referrals were also placed: No orders of the defined types were placed in this encounter.   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  Tumor board is a meeting of clinicians from various specialty areas who evaluate and discuss patients for whom a multidisciplinary approach is being considered. Final determinations in the plan of care are those of the provider(s). The responsibility for follow up of recommendations given during tumor board is that of the provider.   Today's extended care, comprehensive team conference, Hans was not present for the discussion and was not examined.   Multidisciplinary Tumor Board is a multidisciplinary case peer review process.  Decisions discussed in the Multidisciplinary Tumor Board reflect the opinions of the specialists present at the conference without having examined the patient.  Ultimately, treatment and diagnostic decisions  rest with the primary provider(s) and the patient.

## 2020-05-17 ENCOUNTER — Ambulatory Visit
Admission: RE | Admit: 2020-05-17 | Discharge: 2020-05-17 | Disposition: A | Discharge status: Home / Self Care | Payer: Medicare Other | Source: Ambulatory Visit | Attending: Internal Medicine | Admitting: Internal Medicine

## 2020-05-17 ENCOUNTER — Other Ambulatory Visit: Payer: Self-pay

## 2020-05-17 DIAGNOSIS — Z7982 Long term (current) use of aspirin: Secondary | ICD-10-CM | POA: Insufficient documentation

## 2020-05-17 DIAGNOSIS — Z87891 Personal history of nicotine dependence: Secondary | ICD-10-CM | POA: Diagnosis not present

## 2020-05-17 DIAGNOSIS — Z7984 Long term (current) use of oral hypoglycemic drugs: Secondary | ICD-10-CM | POA: Insufficient documentation

## 2020-05-17 DIAGNOSIS — R918 Other nonspecific abnormal finding of lung field: Secondary | ICD-10-CM

## 2020-05-17 DIAGNOSIS — C3491 Malignant neoplasm of unspecified part of right bronchus or lung: Secondary | ICD-10-CM | POA: Diagnosis present

## 2020-05-17 DIAGNOSIS — Z79899 Other long term (current) drug therapy: Secondary | ICD-10-CM | POA: Insufficient documentation

## 2020-05-17 HISTORY — PX: IR IMAGING GUIDED PORT INSERTION: IMG5740

## 2020-05-17 MED ORDER — FENTANYL CITRATE (PF) 100 MCG/2ML IJ SOLN
INTRAMUSCULAR | Status: AC | PRN
  Administered 2020-05-17: 50 ug via INTRAVENOUS

## 2020-05-17 MED ORDER — SODIUM CHLORIDE 0.9 % IV SOLN
INTRAVENOUS | Status: DC
  Administered 2020-05-17: 1000 mL via INTRAVENOUS

## 2020-05-17 MED ORDER — MIDAZOLAM HCL 2 MG/2ML IJ SOLN
INTRAMUSCULAR | Status: AC
  Filled 2020-05-17: qty 2

## 2020-05-17 MED ORDER — HEPARIN SOD (PORK) LOCK FLUSH 100 UNIT/ML IV SOLN
INTRAVENOUS | Status: AC
  Filled 2020-05-17: qty 5

## 2020-05-17 MED ORDER — MIDAZOLAM HCL 2 MG/2ML IJ SOLN
INTRAMUSCULAR | Status: AC | PRN
  Administered 2020-05-17: 1 mg via INTRAVENOUS

## 2020-05-17 MED ORDER — FENTANYL CITRATE (PF) 100 MCG/2ML IJ SOLN
INTRAMUSCULAR | Status: AC
  Filled 2020-05-17: qty 2

## 2020-05-17 NOTE — H&P (Signed)
Chief Complaint: Lung cancer  Referring Physician(s): Rogue Bussing  Supervising Physician: Sandi Mariscal  Patient Status: [facility] - Out-pt  History of Present Illness: Fred Anderson is a 75 y.o. male .  With new diagnosis of lung cancer who is here today for placement of tunneled catheter with port.  He was evaluated in the emergency room on May 06, 2020.  A CT scan obtained showed a right pleural effusion and lung mass concerning for malignancy.  He underwent thoracentesis which showed malignant cells which favor adenocarcinoma.  He is n.p.o.  Past Medical History:  Diagnosis Date   Arthritis    Blood in semen    BPH (benign prostatic hypertrophy)    Cancer (HCC)    melanoma on back   DDD (degenerative disc disease), lumbar    Diabetes mellitus without complication ([place])    TYPE 2   Erectile dysfunction    Hematospermia    Hemorrhoids    History of adenomatous polyp of colon    History of kidney stones    Hyperlipidemia    Hypertension    Kidney stones    Obesity    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[place])    Prediabetes     Past Surgical History:  Procedure Laterality Date   BACK SURGERY  age 22   Broken back   COLONOSCOPY WITH PROPOFOL N/A 05/05/2017   Procedure: COLONOSCOPY WITH PROPOFOL;  Surgeon: Toledo, Benay Pike, MD;  Location: [facility] END[facility]COPY;  Service: Gastroenterology;  Laterality: N/A;   HEMORRHOID SURGERY     LAPAROSCOPIC APPENDECTOMY N/A 04/25/2018   Procedure: APPENDECTOMY LAPAROSCOPIC;  Surgeon: Herbert Pun, MD;  Location: [facility] ORS;  Service: General;  Laterality: N/A;   SHOULDER ARTHROSCOPY WITH OPEN ROTATOR CUFF REPAIR Right 04/03/2019   Procedure: SHOULDER ARTHROSCOPY WITH SUBSACAPULARIS REPAIR, OPEN ROTATOR CUFF REPAIR, SUBARCOMIAL DECOMPRESSION, BICEPS TENODESIS;  Surgeon: Leim Fabry, MD;  Location: [facility] ORS;  Service: Orthopedics;  Laterality: Right;   SMALL INTESTINE SURGERY       Allergies: Atorvastatin  Medications: Prior to Admission medications   Medication Sig Start Date End Date Taking? Authorizing Provider  aspirin 325 MG tablet Take 325 mg by mouth daily.   Yes [provider]  folic acid (FOLVITE) 1 MG tablet Take 1 tablet (1 mg total) by mouth daily. 05/13/20  Yes Cammie Sickle, MD  lovastatin (MEVACOR) 40 MG tablet Take 40 mg by mouth at bedtime.  10/03/17  Yes [provider]  metFORMIN (GLUCOPHAGE) 500 MG tablet Take 1,000 mg by mouth 2 (two) times daily. 03/02/19  Yes [provider]  Multiple Vitamin (MULTIVITAMIN WITH MINERALS) TABS tablet Take 1 tablet by mouth daily.   Yes [provider]  Omega-3 Fatty Acids (FISH OIL) 500 MG CAPS Take 500 mg by mouth 2 (two) times daily.    Yes [provider]  tamsulosin (FLOMAX) 0.4 MG CAPS capsule Take 1 capsule (0.4 mg total) by mouth daily. 12/08/19  Yes McGowan, Larene Beach A, PA-C  dexamethasone (DECADRON) 4 MG tablet Take one pill AM & PM x 2 days; one day before and one day after chemo. None on the day of chemo. 05/13/20   Cammie Sickle, MD  lidocaine-prilocaine (EMLA) cream Apply 1 application topically as needed. 05/13/20   Cammie Sickle, MD  ondansetron (ZOFRAN) 8 MG tablet One pill every 8 hours as needed for nausea/vomitting. 05/13/20   Cammie Sickle, MD  prochlorperazine (COMPAZINE) 10 MG tablet Take 1 tablet (10 mg total)  by mouth every 6 (six) hours as needed for nausea or vomiting. 05/13/20   Cammie Sickle, MD     Family History  Problem Relation Age of Onset   Colon cancer Father    Cancer Mother    Liver cancer Sister    Kidney cancer Brother    Bone cancer Brother    Kidney disease Neg Hx    Prostate cancer Neg Hx    Bladder Cancer Neg Hx     Social History   Socioeconomic History   Marital status: Married    Spouse name: Not on file   Number of children: Not on file   Years of education: Not  on file   Highest education level: Not on file  Occupational History   Not on file  Tobacco Use   Smoking status: Former Smoker    Quit date: 08/15/1988    Years since quitting: 31.7   Smokeless tobacco: Never Used   Tobacco comment: quit september 11th 1991  Vaping Use   Vaping Use: Never used  Substance and Sexual Activity   Alcohol use: Yes    Alcohol/week: 12.0 standard drinks    Types: 12 Cans of beer per week   Drug use: No   Sexual activity: Not on file  Other Topics Concern   Not on file  Social History Narrative   Quit smoking in 1991; used to smoke 3 ppd. Lives in [place]; with wife; and 2 daughters. 2-3 drink 4/ week. Worked in Tourist information centre manager; retired in 2013; raised cattle.    Social Determinants of Health   Financial Resource Strain: Not on file  Food Insecurity: Not on file  Transportation Needs: Not on file  Physical Activity: Not on file  Stress: Not on file  Social Connections: Not on file     Review of Systems: A 12 point ROS discussed and pertinent positives are indicated in the HPI above.  All other systems are negative.  Review of Systems  Vital Signs: BP 135/74    Pulse 99    Temp 97.8 F (36.6 C) (Oral)    Resp 18    Ht 5\' 11"  (1.803 m)    Wt 113.4 kg    SpO2 94%    BMI 34.87 kg/m   Physical Exam Vitals reviewed.  Constitutional:      Appearance: Normal appearance.  HENT:     Head: Normocephalic and atraumatic.  Eyes:     Extraocular Movements: Extraocular movements intact.  Cardiovascular:     Rate and Rhythm: Normal rate and regular rhythm.  Pulmonary:     Effort: Pulmonary effort is normal. No respiratory distress.     Breath sounds: Normal breath sounds.  Abdominal:     General: There is no distension.     Palpations: Abdomen is soft.     Tenderness: There is no abdominal tenderness.  Musculoskeletal:        General: Normal range of motion.     Cervical back: Normal range of motion.  Skin:    General: Skin is warm and  dry.  Neurological:     General: No focal deficit present.     Mental Status: He is alert and oriented to person, place, and time.  Psychiatric:        Mood and Affect: Mood normal.        Behavior: Behavior normal.        Thought Content: Thought content normal.        Judgment: Judgment  normal.     Imaging: DG Chest 2 View  Result Date: 05/06/2020 CLINICAL DATA:  Intermittent shortness of breath EXAM: CHEST - 2 VIEW COMPARISON:  2016 FINDINGS: Right pleural effusion. Likely partially loculated extending along the chest wall laterally. There is also fissural fluid. Adjacent right lung atelectasis. Left lung is clear. Heart size is normal. No acute osseous abnormality. IMPRESSION: Likely partially loculated small to moderate right pleural effusion. Adjacent atelectasis. Electronically Signed   By: Macy Mis M.D.   On: 05/06/2020 10:00   CT Angio Chest PE W and/or Wo Contrast  Result Date: 05/06/2020 CLINICAL DATA:  Intermittent shortness of breath and chest pain. EXAM: CT ANGIOGRAPHY CHEST WITH CONTRAST TECHNIQUE: Multidetector CT imaging of the chest was performed using the standard protocol during bolus administration of intravenous contrast. Multiplanar CT image reconstructions and MIPs were obtained to evaluate the vascular anatomy. CONTRAST:  11mL OMNIPAQUE IOHEXOL 350 MG/ML SOLN COMPARISON:  10/16/2014. FINDINGS: Cardiovascular: Image quality is degraded by respiratory motion, limiting the evaluation of the segmental and subsegmental pulmonary arteries. Otherwise, no central or lobar pulmonary embolus. Atherosclerotic calcification of the aorta, aortic valve and coronary arteries. Right and left pulmonary arteries are enlarged as is the heart. No pericardial effusion. Mediastinum/Nodes: Mediastinal adenopathy measures up to 1.8 cm in the low right paratracheal station, new. Right hilar adenopathy measures up to approximately 1.5 cm. Numerous small prepericardiac/juxtadiaphragmatic  lymph nodes are new. No left hilar or axillary adenopathy. Esophagus is grossly unremarkable. Lungs/Pleura: Image quality is markedly degraded by respiratory motion. Centrally obstructing perihilar right upper lobe masses measure approximately 3.2 x 3.4 cm (6/37) and 2.7 x 4.8 cm (6/44), respectively. Postobstructive volume loss in the right upper lobe. Peripheral nodular consolidation in the apical right upper lobe measures 1.5 x 2.2 cm (6/25). Large right pleural effusion with pleural and extrapleural nodularity and thickening. Left lung is grossly clear. Airway is otherwise grossly unremarkable. Upper Abdomen: Visualized portions of the liver, adrenal glands, kidneys, spleen, pancreas, stomach and bowel are grossly unremarkable. Musculoskeletal: Degenerative changes in the spine. No worrisome lytic or sclerotic lesions. Old T12 compression deformity. Flowing anterior osteophytosis in the thoracic spine. Review of the MIP images confirms the above findings. IMPRESSION: 1. Image quality is degraded by expiratory phase imaging and respiratory motion, limiting the evaluation of segmental and subsegmental pulmonary arteries. Otherwise, no central or lobar pulmonary embolus. 2. 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 3. Aortic atherosclerosis (ICD10-I70.0). Coronary artery calcification. 4. Enlarged pulmonary arteries, indicative of pulmonary arterial hypertension. Electronically Signed   By: Lorin Picket M.D.   On: 05/06/2020 11:05   MR Brain W Wo Contrast  Result Date: 05/10/2020 CLINICAL DATA:  Non-small cell lung cancer.  Staging. EXAM: MRI HEAD WITHOUT AND WITH CONTRAST TECHNIQUE: Multiplanar, multiecho pulse sequences of the brain and surrounding structures were obtained without  and with intravenous contrast. CONTRAST:  73mL GADAVIST GADOBUTROL 1 MMOL/ML IV SOLN COMPARISON:  Head CT October 16, 2014. FINDINGS: Brain: No acute infarction, hemorrhage, hydrocephalus, extra-axial collection or mass lesion. A few scattered foci of T2 hyperintensity within the white matter of the cerebral hemispheres, nonspecific, most likely related to mild chronic microangiopathic changes. Mild parenchymal volume loss. No focus of abnormal contrast enhancement. Vascular: Normal flow voids. Skull and upper cervical spine: T1 hypointense lesion within the clivus with mild contrast enhancement. Sinuses/Orbits: Minimal mucosal  thickening of the sphenoid sinuses. The orbits are maintained. IMPRESSION: 1. T1 hypointense lesion within the clivus with mild contrast enhancement. This may represent a metastatic lesion. No evidence of meningeal or parenchymal metastatic disease. 2. Mild chronic microangiopathic changes and parenchymal volume loss. Electronically Signed   By: Pedro Earls M.D.   On: 05/10/2020 10:37   NM PET Image Initial (PI) Skull Base To Thigh  Result Date: 05/15/2020 CLINICAL DATA:  Initial treatment strategy for non-small cell lung cancer. EXAM: NUCLEAR MEDICINE PET SKULL BASE TO THIGH TECHNIQUE: 12.9 mCi F-18 FDG was injected intravenously. Full-ring PET imaging was performed from the skull base to thigh after the radiotracer. CT data was obtained and used for attenuation correction and anatomic localization. Fasting blood glucose: 106 mg/dl COMPARISON:  CT chest 05/06/2020.  CT stone protocol 04/25/2018. FINDINGS: Mediastinal blood pool activity: SUV max 2.1 Liver activity: SUV max NA NECK: No hypermetabolic lymph nodes in the neck. Incidental CT findings: none CHEST: Anterior right upper lobe lesion measuring 2.2 cm on image 60/series 3 is hypermetabolic with SUV max = 5.6. low right paratracheal node is markedly hypermetabolic with SUV max = 9.5. This is a cyst with hypermetabolic  disease in the right hilum and parahilar right upper lobe ( SUV max = 7.8. Multiple hypermetabolic right pleural lesions identified including index right paraspinal lesion with SUV max = 6.1 and 3.1 cm anterior pleural lesion in the deep costophrenic sulcus (image 126/3) with SUV max = 5.6. No hypermetabolic nodule or mass noted in the left lung. Incidental CT findings: Coronary artery calcification is evident. Atherosclerotic calcification is noted in the wall of the thoracic aorta. Moderate right pleural effusion. ABDOMEN/PELVIS: No abnormal hypermetabolic activity within the liver, pancreas, adrenal glands, or spleen. No hypermetabolic lymph nodes in the abdomen or pelvis. Diffuse uptake in the colon is likely physiologic. Incidental CT findings: Diffuse low attenuation of the liver parenchyma is compatible with steatosis. Nonobstructing stones identified right kidney. Diverticular changes noted in the colon without diverticulitis. SKELETON: Scattered hypermetabolic foci are seen in the thoracolumbar spine. No associated lytic or sclerotic abnormality on CT imaging, but imaging features suspicious for bony metastatic involvement. Uptake in the L4 vertebral body demonstrates SUV max = 5.5. Incidental CT findings: none IMPRESSION: 1. Marked hypermetabolism in the right hilum and parahilar right upper lobe with hypermetabolic peripheral right upper lung lobe lung nodule, compatible with the patient's history of non-small-cell lung cancer. Multiple hypermetabolic pleural metastases are associated in right hemithorax. 2. Scattered hypermetabolic foci of uptake in the thoracolumbar spine. While no underlying CT findings are noted, PET imaging features are concerning for bony metastatic involvement. 3. Moderate right pleural effusion. 4.  Aortic Atherosclerois (ICD10-170.0) Electronically Signed   By: Misty Stanley M.D.   On: 05/15/2020 12:45   DG Chest Port 1 View  Result Date: 05/06/2020 CLINICAL DATA:   75 year old male status post right thoracentesis. EXAM: PORTABLE CHEST 1 VIEW COMPARISON:  Chest radiograph dated 05/06/2020 and CT dated 05/06/2020 FINDINGS: Slight interval decrease in the size of the right pleural effusion compared to the prior radiograph. No pneumothorax. Faint linear lucency along the minor fissure was present on the prior CT and may be related to small subpleural blebs or tiny amount of air in the pleural space. Right perihilar density corresponding to the known mass. The left lung is clear. Stable cardiomediastinal silhouette. Atherosclerotic calcification of the aorta. Degenerative changes of the spine. No acute osseous pathology. IMPRESSION: Slight interval decrease in the size  of the right pleural effusion status post thoracentesis. No pneumothorax. Electronically Signed   By: Anner Crete M.D.   On: 05/06/2020 16:21   US THORACENTESIS ASP PLEURAL SPACE W/IMG GUIDE  Result Date: 05/06/2020 INDICATION: Shortness of breath. Newly found lung mass with right-sided pleural effusion. Request for diagnostic and therapeutic thoracentesis EXAM: ULTRASOUND GUIDED RIGHT THORACENTESIS MEDICATIONS: 1% plain lidocaine, 10 mL COMPLICATIONS: None immediate. PROCEDURE: An ultrasound guided thoracentesis was thoroughly discussed with the patient and questions answered. The benefits, risks, alternatives and complications were also discussed. The patient understands and wishes to proceed with the procedure. Written consent was obtained. Ultrasound was performed to localize and mark an adequate pocket of fluid in the right chest. The area was then prepped and draped in the normal sterile fashion. 1% Lidocaine was used for local anesthesia. Under ultrasound guidance a 6 Fr Safe-T-Centesis catheter was introduced. Thoracentesis was performed. The catheter was removed and a dressing applied. FINDINGS: A total of approximately 1 L of blood-tinged fluid was removed. Samples were sent to the laboratory as  requested by the clinical team. IMPRESSION: Successful ultrasound guided right thoracentesis yielding 1 L of pleural fluid. Read by: Ascencion Dike PA-C Electronically Signed   By: Markus Daft M.D.   On: 05/06/2020 16:11    Labs:  CBC: Recent Labs    05/06/20 0916 05/13/20 1308  WBC 7.3 7.9  HGB 15.0 15.0  HCT 44.9 44.3  PLT 211 225    COAGS: No results for input(s): INR, APTT in the last 8760 hours.  BMP: Recent Labs    05/06/20 0916 05/13/20 1308  NA 135 136  K 4.1 4.1  CL 101 100  CO2 22 26  GLUCOSE 186* 147*  BUN 11 12  CALCIUM 9.4 9.2  CREATININE 0.69 0.69  GFRNONAA >60 >60    LIVER FUNCTION TESTS: Recent Labs    05/13/20 1308  BILITOT 0.7  AST 24  ALT 14  ALKPHOS 70  PROT 6.8  ALBUMIN 3.4*    TUMOR MARKERS: No results for input(s): AFPTM, CEA, CA199, CHROMGRNA in the last 8760 hours.  Assessment and Plan:  Lung cancer  We will proceed today with image guided placement of a tunneled catheter with port by Dr. Pascal Lux.  Risks and benefits of image guided port-a-catheter placement was discussed with the patient including, but not limited to bleeding, infection, pneumothorax, or fibrin sheath development and need for additional procedures.  All of the patient's questions were answered, patient is agreeable to proceed. Consent signed and in chart.  Thank you for this interesting consult.  I greatly enjoyed meeting DECKER COGDELL and look forward to participating in their care.  A copy of this report was sent to the requesting provider on this date.  Electronically Signed: Murrell Redden, PA-C   05/17/2020, 10:25 AM      I spent a total of  15 Minutes  in face to face in clinical consultation, greater than 50% of which was counseling/coordinating care for placement of tunnel catheter with port.

## 2020-05-17 NOTE — Discharge Instructions (Signed)
Implanted Port Insertion, Care After This sheet gives you information about how to care for yourself after your procedure. Your health care provider may also give you more specific instructions. If you have problems or questions, contact your health care provider. What can I expect after the procedure? After the procedure, it is common to have:  Discomfort at the port insertion site.  Bruising on the skin over the port. This should improve over 3-4 days. Follow these instructions at home: [facility] care  After your port is placed, you will get a manufacturer's information card. The card has information about your port. Keep this card with you at all times.  Take care of the port as told by your health care provider. Ask your health care provider if you or a family member can get training for taking care of the port at home. A home health care nurse may also take care of the port.  Make sure to remember what type of port you have. Incision care  Follow instructions from your health care provider about how to take care of your port insertion site. Make sure you: ? Wash your hands with soap and water before and after you change your bandage (dressing). If soap and water are not available, use hand sanitizer. ? Change your dressing as told by your health care provider. ? Leave stitches (sutures), skin glue, or adhesive strips in place. These skin closures may need to stay in place for 2 weeks or longer. If adhesive strip edges start to loosen and curl up, you may trim the loose edges. Do not remove adhesive strips completely unless your health care provider tells you to do that.  Check your port insertion site every day for signs of infection. Check for: ? Redness, swelling, or pain. ? Fluid or blood. ? Warmth. ? Pus or a bad smell.      Activity  Return to your normal activities as told by your health care provider. Ask your health care provider what activities are safe for you.  Do not  lift anything that is heavier than 10 lb (4.5 kg), or the limit that you are told, until your health care provider says that it is safe. General instructions  Take over-the-counter and prescription medicines only as told by your health care provider.  Do not take baths, swim, or use a hot tub until your health care provider approves. Ask your health care provider if you may take showers. You may only be allowed to take sponge baths.  Do not drive for 24 hours if you were given a sedative during your procedure.  Wear a medical alert bracelet in case of an emergency. This will tell any health care providers that you have a port.  Keep all follow-up visits as told by your health care provider. This is important. Contact a health care provider if:  You cannot flush your port with saline as directed, or you cannot draw blood from the port.  You have a fever or chills.  You have redness, swelling, or pain around your port insertion site.  You have fluid or blood coming from your port insertion site.  Your port insertion site feels warm to the touch.  You have pus or a bad smell coming from the port insertion site. Get help right away if:  You have chest pain or shortness of breath.  You have bleeding from your port that you cannot control. Summary  Take care of the port as told by your  health care provider. Keep the manufacturer's information card with you at all times.  Change your dressing as told by your health care provider.  Contact a health care provider if you have a fever or chills or if you have redness, swelling, or pain around your port insertion site.  Keep all follow-up visits as told by your health care provider. This information is not intended to replace advice given to you by your health care provider. Make sure you discuss any questions you have with your health care provider. Document Revised: 09/28/2017 Document Reviewed: 09/28/2017 Elsevier Patient Education   2021 [place]. Moderate Conscious Sedation, Adult, Care After This sheet gives you information about how to care for yourself after your procedure. Your health care provider may also give you more specific instructions. If you have problems or questions, contact your health care provider. What can I expect after the procedure? After the procedure, it is common to have:  Sleepiness for several hours.  Impaired judgment for several hours.  Difficulty with balance.  Vomiting if you eat too soon. Follow these instructions at home: For the time period you were told by your health care provider:  Rest.  Do not participate in activities where you could fall or become injured.  Do not drive or use machinery.  Do not drink alcohol.  Do not take sleeping pills or medicines that cause drowsiness.  Do not make important decisions or sign legal documents.  Do not take care of children on your own.      Eating and drinking  Follow the diet recommended by your health care provider.  Drink enough fluid to keep your urine pale yellow.  If you vomit: ? Drink water, juice, or soup when you can drink without vomiting. ? Make sure you have little or no nausea before eating solid foods.   General instructions  Take over-the-counter and prescription medicines only as told by your health care provider.  Have a responsible adult stay with you for the time you are told. It is important to have someone help care for you until you are awake and alert.  Do not smoke.  Keep all follow-up visits as told by your health care provider. This is important. Contact a health care provider if:  You are still sleepy or having trouble with balance after 24 hours.  You feel light-headed.  You keep feeling nauseous or you keep vomiting.  You develop a rash.  You have a fever.  You have redness or swelling around the IV site. Get help right away if:  You have trouble breathing.  You have  new-onset confusion at home. Summary  After the procedure, it is common to feel sleepy, have impaired judgment, or feel nauseous if you eat too soon.  Rest after you get home. Know the things you should not do after the procedure.  Follow the diet recommended by your health care provider and drink enough fluid to keep your urine pale yellow.  Get help right away if you have trouble breathing or new-onset confusion at home. This information is not intended to replace advice given to you by your health care provider. Make sure you discuss any questions you have with your health care provider. Document Revised: 06/30/2019 Document Reviewed: 01/26/2019 Elsevier Patient Education  2021 [employer].

## 2020-05-17 NOTE — Procedures (Signed)
Pre Procedure Dx: Poor venous access Post Procedural Dx: Same  Successful placement of right IJ approach port-a-cath with tip at the superior caval atrial junction. The catheter is ready for immediate use.  Estimated Blood Loss: Minimal  Complications: None immediate.  Jay Daimien Patmon, MD Pager #: [phone]   

## 2020-05-23 ENCOUNTER — Inpatient Hospital Stay (HOSPITAL_BASED_OUTPATIENT_CLINIC_OR_DEPARTMENT_OTHER): Payer: Medicare Other | Admitting: Internal Medicine

## 2020-05-23 ENCOUNTER — Inpatient Hospital Stay: Payer: Medicare Other

## 2020-05-23 ENCOUNTER — Inpatient Hospital Stay (HOSPITAL_BASED_OUTPATIENT_CLINIC_OR_DEPARTMENT_OTHER): Payer: Medicare Other | Admitting: Hospice and Palliative Medicine

## 2020-05-23 ENCOUNTER — Encounter: Payer: Self-pay | Admitting: *Deleted

## 2020-05-23 DIAGNOSIS — C3411 Malignant neoplasm of upper lobe, right bronchus or lung: Secondary | ICD-10-CM

## 2020-05-23 DIAGNOSIS — Z95828 Presence of other vascular implants and grafts: Secondary | ICD-10-CM

## 2020-05-23 DIAGNOSIS — Z515 Encounter for palliative care: Secondary | ICD-10-CM | POA: Diagnosis not present

## 2020-05-23 LAB — CBC WITH DIFFERENTIAL/PLATELET
Abs Immature Granulocytes: 0.12 10*3/uL — ABNORMAL HIGH (ref 0.00–0.07)
Basophils Absolute: 0.1 10*3/uL (ref 0.0–0.1)
Basophils Relative: 1 %
Eosinophils Absolute: 0.1 10*3/uL (ref 0.0–0.5)
Eosinophils Relative: 2 %
HCT: 43.6 % (ref 39.0–52.0)
Hemoglobin: 14.9 g/dL (ref 13.0–17.0)
Immature Granulocytes: 1 %
Lymphocytes Relative: 23 %
Lymphs Abs: 2 10*3/uL (ref 0.7–4.0)
MCH: 31.8 pg (ref 26.0–34.0)
MCHC: 34.2 g/dL (ref 30.0–36.0)
MCV: 93 fL (ref 80.0–100.0)
Monocytes Absolute: 1 10*3/uL (ref 0.1–1.0)
Monocytes Relative: 12 %
Neutro Abs: 5.2 10*3/uL (ref 1.7–7.7)
Neutrophils Relative %: 61 %
Platelets: 219 10*3/uL (ref 150–400)
RBC: 4.69 MIL/uL (ref 4.22–5.81)
RDW: 12.4 % (ref 11.5–15.5)
WBC: 8.5 10*3/uL (ref 4.0–10.5)
nRBC: 0 % (ref 0.0–0.2)

## 2020-05-23 LAB — COMPREHENSIVE METABOLIC PANEL
ALT: 13 U/L (ref 0–44)
AST: 28 U/L (ref 15–41)
Albumin: 3.4 g/dL — ABNORMAL LOW (ref 3.5–5.0)
Alkaline Phosphatase: 76 U/L (ref 38–126)
Anion gap: 9 (ref 5–15)
BUN: 8 mg/dL (ref 8–23)
CO2: 28 mmol/L (ref 22–32)
Calcium: 9.6 mg/dL (ref 8.9–10.3)
Chloride: 97 mmol/L — ABNORMAL LOW (ref 98–111)
Creatinine, Ser: 0.66 mg/dL (ref 0.61–1.24)
GFR, Estimated: >60 mL/min (ref >60)
Glucose, Bld: 145 mg/dL — ABNORMAL HIGH (ref 70–99)
Potassium: 3.9 mmol/L (ref 3.5–5.1)
Sodium: 134 mmol/L — ABNORMAL LOW (ref 135–145)
Total Bilirubin: 0.7 mg/dL (ref 0.3–1.2)
Total Protein: 7 g/dL (ref 6.5–8.1)

## 2020-05-23 MED ORDER — HEPARIN SOD (PORK) LOCK FLUSH 100 UNIT/ML IV SOLN
500.0000 [IU] | Freq: Once | INTRAVENOUS | Status: AC
  Administered 2020-05-23: 500 [IU] via INTRAVENOUS
  Filled 2020-05-23: qty 5

## 2020-05-23 MED ORDER — PALONOSETRON HCL INJECTION 0.25 MG/5ML
0.2500 mg | Freq: Once | INTRAVENOUS | Status: AC
  Administered 2020-05-23: 0.25 mg via INTRAVENOUS
  Filled 2020-05-23: qty 5

## 2020-05-23 MED ORDER — HYDROCOD POLST-CPM POLST ER 10-8 MG/5ML PO SUER: 5.0000 mL | Freq: Every evening | ORAL | 0 refills | Status: AC | PRN

## 2020-05-23 MED ORDER — ALBUTEROL SULFATE HFA 108 (90 BASE) MCG/ACT IN AERS: 2.0000 | INHALATION_SPRAY | Freq: Four times a day (QID) | RESPIRATORY_TRACT | 2 refills | Status: AC | PRN

## 2020-05-23 MED ORDER — SODIUM CHLORIDE 0.9 % IV SOLN
1200.0000 mg | Freq: Once | INTRAVENOUS | Status: AC
  Administered 2020-05-23: 1200 mg via INTRAVENOUS
  Filled 2020-05-23: qty 40

## 2020-05-23 MED ORDER — SODIUM CHLORIDE 0.9 % IV SOLN
150.0000 mg | Freq: Once | INTRAVENOUS | Status: AC
  Administered 2020-05-23: 150 mg via INTRAVENOUS
  Filled 2020-05-23: qty 150

## 2020-05-23 MED ORDER — HEPARIN SOD (PORK) LOCK FLUSH 100 UNIT/ML IV SOLN
INTRAVENOUS | Status: AC
  Filled 2020-05-23: qty 5

## 2020-05-23 MED ORDER — SODIUM CHLORIDE 0.9 % IV SOLN
10.0000 mg | Freq: Once | INTRAVENOUS | Status: AC
  Administered 2020-05-23: 10 mg via INTRAVENOUS
  Filled 2020-05-23: qty 10

## 2020-05-23 MED ORDER — HEPARIN SOD (PORK) LOCK FLUSH 100 UNIT/ML IV SOLN
500.0000 [IU] | Freq: Once | INTRAVENOUS | Status: DC | PRN
  Filled 2020-05-23: qty 5

## 2020-05-23 MED ORDER — SODIUM CHLORIDE 0.9% FLUSH
10.0000 mL | Freq: Once | INTRAVENOUS | Status: AC
  Administered 2020-05-23: 10 mL via INTRAVENOUS
  Filled 2020-05-23: qty 10

## 2020-05-23 MED ORDER — SODIUM CHLORIDE 0.9% FLUSH
10.0000 mL | INTRAVENOUS | Status: DC | PRN
  Administered 2020-05-23: 10 mL
  Filled 2020-05-23: qty 10

## 2020-05-23 MED ORDER — SODIUM CHLORIDE 0.9 % IV SOLN
Freq: Once | INTRAVENOUS | Status: AC
  Filled 2020-05-23: qty 250

## 2020-05-23 MED ORDER — SODIUM CHLORIDE 0.9 % IV SOLN
650.0000 mg | Freq: Once | INTRAVENOUS | Status: AC
  Administered 2020-05-23: 650 mg via INTRAVENOUS
  Filled 2020-05-23: qty 65

## 2020-05-23 NOTE — Progress Notes (Signed)
[place]  [phone] [phone]   Name: Fred Anderson Date: 05/23/2020 MRN: [id number]  DOB: 08/05/1945  Patient Care Team: Ezequiel Kayser, MD as PCP - General (Internal Medicine) Telford Nab, RN as Oncology Nurse Navigator    REASON FOR CONSULTATION: Fred Anderson is a 75 y.o. 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  Patient is on systemic chemotherapy/immunotherapy.  He was referred to palliative care to help address goals and manage ongoing symptoms.  SOCIAL HISTORY:     reports that he quit smoking about 31 years ago. He has never used smokeless tobacco. He reports current alcohol use of about 12.0 standard drinks of alcohol per week. He reports that he does not use drugs.  Patient is married and lives at home with his wife and daughter.  He has 4 adult children.  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  ADVANCE DIRECTIVES:  None on file  CODE STATUS:   PAST MEDICAL HISTORY: Past Medical History:  Diagnosis Date  . Arthritis   . Blood in semen   . BPH (benign prostatic hypertrophy)   . 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 DDD (degenerative disc disease), lumbar   . Diabetes mellitus without complication ([place])    TYPE 2  . Erectile dysfunction   . Hematospermia   . Hemorrhoids   . History of adenomatous polyp of colon   . History of kidney stones   . Hyperlipidemia   . Hypertension   . Kidney stones   . Obesity   . 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[place])   . Prediabetes     PAST SURGICAL HISTORY:  Past Surgical History:  Procedure Laterality Date  . BACK SURGERY  age 40   Broken back  . COLONOSCOPY WITH PROPOFOL N/A 05/05/2017   Procedure: COLONOSCOPY WITH PROPOFOL;  Surgeon: Toledo, Benay Pike, MD;  Location: [facility] ENDOSCOPY;  Service: Gastroenterology;   Laterality: N/A;  . HEMORRHOID SURGERY    . IR IMAGING GUIDED PORT INSERTION  05/17/2020  . LAPAROSCOPIC APPENDECTOMY N/A 04/25/2018   Procedure: APPENDECTOMY LAPAROSCOPIC;  Surgeon: Herbert Pun, MD;  Location: [facility] ORS;  Service: General;  Laterality: N/A;  . SHOULDER ARTHROSCOPY WITH OPEN ROTATOR CUFF REPAIR Right 04/03/2019   Procedure: SHOULDER ARTHROSCOPY WITH SUBSACAPULARIS REPAIR, OPEN ROTATOR CUFF REPAIR, SUBARCOMIAL DECOMPRESSION, BICEPS TENODESIS;  Surgeon: Leim Fabry, MD;  Location: [facility] ORS;  Service: Orthopedics;  Laterality: Right;  . SMALL INTESTINE SURGERY      HEMATOLOGY/ONCOLOGY HISTORY:  Oncology History Overview Note  May 07, 2020- CT chest [ER]..Centrally obstructing right upper lobe masses, right upper lobe nodular consolidation, large right pleural effusion and extensive pleural/extrapleural nodularity and mediastinal/right hilar adenopathy, findings most indicative of stage IV primary bronchogenic carcinoma.s/p RIGHT Thoracentesis- MALIGNANT CELLS PRESENT.  - METASTATIC NON SMALL CELL CARCINOMA, FAVOR ADENOCARCINOMA. FEB- 2022- MRI Brain NEG for parenchymal metastases; cervical bone met. PET- P  # Enlarged pulmonary arteries, indicative of pulmonary arterial Hypertension.  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; [place]]  # NGS/MOLECULAR TESTS:    # PALLIATIVE CARE EVALUATION:  # PAIN MANAGEMENT:    DIAGNOSIS: Lung cancer  STAGE:  IV       ;  GOALS: palliative  CURRENT/MOST RECENT THERAPY :     Cancer of upper lobe of right lung ([place])  05/13/2020 Initial Diagnosis   Cancer of upper lobe  of right lung ([place])   05/13/2020 Cancer Staging   Staging form: Lung, AJCC 8th Edition - Clinical: Stage IVA (cT2, cN3, pM1b) - Signed by Cammie Sickle, MD on 05/13/2020 Histopathologic type: Adenocarcinoma, NOS   05/23/2020 -  Chemotherapy    Patient is on Treatment Plan: LUNG CARBOPLATIN / PEMETREXED / PEMBROLIZUMAB Q21D INDUCTION X 4 CYCLES / MAINTENANCE  PEMETREXED + PEMBROLIZUMAB        ALLERGIES:  is allergic to atorvastatin.  MEDICATIONS:  Current Outpatient Medications  Medication Sig Dispense Refill  . albuterol (VENTOLIN HFA) 108 (90 Base) MCG/ACT inhaler Inhale 2 puffs into the lungs every 6 (six) hours as needed for wheezing or shortness of breath. 1 each 2  . aspirin 325 MG tablet Take 325 mg by mouth daily.    . chlorpheniramine-HYDROcodone (TUSSIONEX) 10-8 MG/5ML SUER Take 5 mLs by mouth at bedtime as needed for cough. 140 mL 0  . dexamethasone (DECADRON) 4 MG tablet Take one pill AM & PM x 2 days; one day before and one day after chemo. None on the day of chemo. (Patient not taking: Reported on 05/23/2020) 60 tablet 0  . folic acid (FOLVITE) 1 MG tablet Take 1 tablet (1 mg total) by mouth daily. 90 tablet 1  . lidocaine-prilocaine (EMLA) cream Apply 1 application topically as needed. 30 g 0  . lovastatin (MEVACOR) 40 MG tablet Take 40 mg by mouth at bedtime.   0  . metFORMIN (GLUCOPHAGE) 500 MG tablet Take 1,000 mg by mouth 2 (two) times daily.    . Multiple Vitamin (MULTIVITAMIN WITH MINERALS) TABS tablet Take 1 tablet by mouth daily.    . Omega-3 Fatty Acids (FISH OIL) 500 MG CAPS Take 500 mg by mouth 2 (two) times daily.     . ondansetron (ZOFRAN) 8 MG tablet One pill every 8 hours as needed for nausea/vomitting. (Patient not taking: Reported on 05/23/2020) 40 tablet 1  . prochlorperazine (COMPAZINE) 10 MG tablet Take 1 tablet (10 mg total) by mouth every 6 (six) hours as needed for nausea or vomiting. (Patient not taking: Reported on 05/23/2020) 40 tablet 1  . tamsulosin (FLOMAX) 0.4 MG CAPS capsule Take 1 capsule (0.4 mg total) by mouth daily. 90 capsule 3   No current facility-administered medications for this visit.   Facility-Administered Medications Ordered in Other Visits  Medication Dose Route Frequency Provider Last Rate Last Admin  . CARBOplatin (PARAPLATIN) 650 mg in sodium chloride 0.9 % 250 mL chemo infusion  650  mg Intravenous Once Charlaine Dalton R, MD      . heparin lock flush 100 unit/mL  500 Units Intravenous Once Charlaine Dalton R, MD      . heparin lock flush 100 unit/mL  500 Units Intracatheter Once PRN Cammie Sickle, MD      . PEMEtrexed (ALIMTA) 1,200 mg in sodium chloride 0.9 % 100 mL chemo infusion  1,200 mg Intravenous Once Charlaine Dalton R, MD      . sodium chloride flush (NS) 0.9 % injection 10 mL  10 mL Intracatheter PRN Cammie Sickle, MD   10 mL at 05/23/20 1100    VITAL SIGNS: There were no vitals taken for this visit. There were no vitals filed for this visit.  Estimated body mass index is 35.04 kg/m as calculated from the following:   Height as of 05/17/20: 5' 11" (1.803 m).   Weight as of an earlier encounter on 05/23/20: 251 lb 3.2 oz (113.9 kg).  LABS: CBC:  Component Value Date/Time   WBC 8.5 05/23/2020 0925   HGB 14.9 05/23/2020 0925   HCT 43.6 05/23/2020 0925   PLT 219 05/23/2020 0925   MCV 93.0 05/23/2020 0925   NEUTROABS 5.2 05/23/2020 0925   LYMPHSABS 2.0 05/23/2020 0925   MONOABS 1.0 05/23/2020 0925   EOSABS 0.1 05/23/2020 0925   BASOSABS 0.1 05/23/2020 0925   Comprehensive Metabolic Panel:    Component Value Date/Time   NA 134 (L) 05/23/2020 0925   K 3.9 05/23/2020 0925   CL 97 (L) 05/23/2020 0925   CO2 28 05/23/2020 0925   BUN 8 05/23/2020 0925   CREATININE 0.66 05/23/2020 0925   GLUCOSE 145 (H) 05/23/2020 0925   CALCIUM 9.6 05/23/2020 0925   AST 28 05/23/2020 0925   ALT 13 05/23/2020 0925   ALKPHOS 76 05/23/2020 0925   BILITOT 0.7 05/23/2020 0925   PROT 7.0 05/23/2020 0925   ALBUMIN 3.4 (L) 05/23/2020 0925    RADIOGRAPHIC STUDIES: DG Chest 2 View  Result Date: 05/06/2020 CLINICAL DATA:  Intermittent shortness of breath EXAM: CHEST - 2 VIEW COMPARISON:  2016 FINDINGS: Right pleural effusion. Likely partially loculated extending along the chest wall laterally. There is also fissural fluid. Adjacent right lung  atelectasis. Left lung is clear. Heart size is normal. No acute osseous abnormality. IMPRESSION: Likely partially loculated small to moderate right pleural effusion. Adjacent atelectasis. Electronically Signed   By: Macy Mis M.D.   On: 05/06/2020 10:00   CT Angio Chest PE W and/or Wo Contrast  Result Date: 05/06/2020 CLINICAL DATA:  Intermittent shortness of breath and chest pain. EXAM: CT ANGIOGRAPHY CHEST WITH CONTRAST TECHNIQUE: Multidetector CT imaging of the chest was performed using the standard protocol during bolus administration of intravenous contrast. Multiplanar CT image reconstructions and MIPs were obtained to evaluate the vascular anatomy. CONTRAST:  152m OMNIPAQUE IOHEXOL 350 MG/ML SOLN COMPARISON:  10/16/2014. FINDINGS: Cardiovascular: Image quality is degraded by respiratory motion, limiting the evaluation of the segmental and subsegmental pulmonary arteries. Otherwise, no central or lobar pulmonary embolus. Atherosclerotic calcification of the aorta, aortic valve and coronary arteries. Right and left pulmonary arteries are enlarged as is the heart. No pericardial effusion. Mediastinum/Nodes: Mediastinal adenopathy measures up to 1.8 cm in the low right paratracheal station, new. Right hilar adenopathy measures up to approximately 1.5 cm. Numerous small prepericardiac/juxtadiaphragmatic lymph nodes are new. No left hilar or axillary adenopathy. Esophagus is grossly unremarkable. Lungs/Pleura: Image quality is markedly degraded by respiratory motion. Centrally obstructing perihilar right upper lobe masses measure approximately 3.2 x 3.4 cm (6/37) and 2.7 x 4.8 cm (6/44), respectively. Postobstructive volume loss in the right upper lobe. Peripheral nodular consolidation in the apical right upper lobe measures 1.5 x 2.2 cm (6/25). Large right pleural effusion with pleural and extrapleural nodularity and thickening. Left lung is grossly clear. Airway is otherwise grossly unremarkable. Upper  Abdomen: Visualized portions of the liver, adrenal glands, kidneys, spleen, pancreas, stomach and bowel are grossly unremarkable. Musculoskeletal: Degenerative changes in the spine. No worrisome lytic or sclerotic lesions. Old T12 compression deformity. Flowing anterior osteophytosis in the thoracic spine. Review of the MIP images confirms the above findings. IMPRESSION: 1. Image quality is degraded by expiratory phase imaging and respiratory motion, limiting the evaluation of segmental and subsegmental pulmonary arteries. Otherwise, no central or lobar pulmonary embolus. 2. Centrally obstructing right upper lobe masses, right upper lobe nodular consolidation, large right pleural effusion and extensive pleural/extrapleural nodularity and mediastinal/right hilar adenopathy, findings most indicative of stage IV primary  bronchogenic carcinoma. 3. Aortic atherosclerosis (ICD10-I70.0). Coronary artery calcification. 4. Enlarged pulmonary arteries, indicative of pulmonary arterial hypertension. Electronically Signed   By: Lorin Picket M.D.   On: 05/06/2020 11:05   MR Brain W Wo Contrast  Result Date: 05/10/2020 CLINICAL DATA:  Non-small cell lung cancer.  Staging. EXAM: MRI HEAD WITHOUT AND WITH CONTRAST TECHNIQUE: Multiplanar, multiecho pulse sequences of the brain and surrounding structures were obtained without and with intravenous contrast. CONTRAST:  79m GADAVIST GADOBUTROL 1 MMOL/ML IV SOLN COMPARISON:  Head CT October 16, 2014. FINDINGS: Brain: No acute infarction, hemorrhage, hydrocephalus, extra-axial collection or mass lesion. A few scattered foci of T2 hyperintensity within the white matter of the cerebral hemispheres, nonspecific, most likely related to mild chronic microangiopathic changes. Mild parenchymal volume loss. No focus of abnormal contrast enhancement. Vascular: Normal flow voids. Skull and upper cervical spine: T1 hypointense lesion within the clivus with mild contrast enhancement.  Sinuses/Orbits: Minimal mucosal thickening of the sphenoid sinuses. The orbits are maintained. IMPRESSION: 1. T1 hypointense lesion within the clivus with mild contrast enhancement. This may represent a metastatic lesion. No evidence of meningeal or parenchymal metastatic disease. 2. Mild chronic microangiopathic changes and parenchymal volume loss. Electronically Signed   By: KPedro EarlsM.D.   On: 05/10/2020 10:37   NM PET Image Initial (PI) Skull Base To Thigh  Result Date: 05/15/2020 CLINICAL DATA:  Initial treatment strategy for non-small cell lung cancer. EXAM: NUCLEAR MEDICINE PET SKULL BASE TO THIGH TECHNIQUE: 12.9 mCi F-18 FDG was injected intravenously. Full-ring PET imaging was performed from the skull base to thigh after the radiotracer. CT data was obtained and used for attenuation correction and anatomic localization. Fasting blood glucose: 106 mg/dl COMPARISON:  CT chest 05/06/2020.  CT stone protocol 04/25/2018. FINDINGS: Mediastinal blood pool activity: SUV max 2.1 Liver activity: SUV max NA NECK: No hypermetabolic lymph nodes in the neck. Incidental CT findings: none CHEST: Anterior right upper lobe lesion measuring 2.2 cm on image 60/series 3 is hypermetabolic with SUV max = 5.6. low right paratracheal node is markedly hypermetabolic with SUV max = 9.5. This is a cyst with hypermetabolic disease in the right hilum and parahilar right upper lobe ( SUV max = 7.8. Multiple hypermetabolic right pleural lesions identified including index right paraspinal lesion with SUV max = 6.1 and 3.1 cm anterior pleural lesion in the deep costophrenic sulcus (image 126/3) with SUV max = 5.6. No hypermetabolic nodule or mass noted in the left lung. Incidental CT findings: Coronary artery calcification is evident. Atherosclerotic calcification is noted in the wall of the thoracic aorta. Moderate right pleural effusion. ABDOMEN/PELVIS: No abnormal hypermetabolic activity within the liver,  pancreas, adrenal glands, or spleen. No hypermetabolic lymph nodes in the abdomen or pelvis. Diffuse uptake in the colon is likely physiologic. Incidental CT findings: Diffuse low attenuation of the liver parenchyma is compatible with steatosis. Nonobstructing stones identified right kidney. Diverticular changes noted in the colon without diverticulitis. SKELETON: Scattered hypermetabolic foci are seen in the thoracolumbar spine. No associated lytic or sclerotic abnormality on CT imaging, but imaging features suspicious for bony metastatic involvement. Uptake in the L4 vertebral body demonstrates SUV max = 5.5. Incidental CT findings: none IMPRESSION: 1. Marked hypermetabolism in the right hilum and parahilar right upper lobe with hypermetabolic peripheral right upper lung lobe lung nodule, compatible with the patient's history of non-small-cell lung cancer. Multiple hypermetabolic pleural metastases are associated in right hemithorax. 2. Scattered hypermetabolic foci of uptake in the thoracolumbar spine.  While no underlying CT findings are noted, PET imaging features are concerning for bony metastatic involvement. 3. Moderate right pleural effusion. 4.  Aortic Atherosclerois (ICD10-170.0) Electronically Signed   By: Misty Stanley M.D.   On: 05/15/2020 12:45   DG Chest Port 1 View  Result Date: 05/06/2020 CLINICAL DATA:  75 year old male status post right thoracentesis. EXAM: PORTABLE CHEST 1 VIEW COMPARISON:  Chest radiograph dated 05/06/2020 and CT dated 05/06/2020 FINDINGS: Slight interval decrease in the size of the right pleural effusion compared to the prior radiograph. No pneumothorax. Faint linear lucency along the minor fissure was present on the prior CT and may be related to small subpleural blebs or tiny amount of air in the pleural space. Right perihilar density corresponding to the known mass. The left lung is clear. Stable cardiomediastinal silhouette. Atherosclerotic calcification of the aorta.  Degenerative changes of the spine. No acute osseous pathology. IMPRESSION: Slight interval decrease in the size of the right pleural effusion status post thoracentesis. No pneumothorax. Electronically Signed   By: Anner Crete M.D.   On: 05/06/2020 16:21   IR IMAGING GUIDED PORT INSERTION  Result Date: 05/17/2020 INDICATION: History of metastatic lung cancer. In need of durable intravenous access for chemotherapy administration. EXAM: IMPLANTED PORT A CATH PLACEMENT WITH ULTRASOUND AND FLUOROSCOPIC GUIDANCE COMPARISON:  PET-CT-05/14/2020 MEDICATIONS: None ANESTHESIA/SEDATION: Moderate (conscious) sedation was employed during this procedure. A total of Versed 1 mg and Fentanyl 50 mcg was administered intravenously. Moderate Sedation Time: 21 minutes. The patient's level of consciousness and vital signs were monitored continuously by radiology nursing throughout the procedure under my direct supervision. CONTRAST:  None FLUOROSCOPY TIME:  1 minute, 12 seconds (89.3 mGy) COMPLICATIONS: None immediate. PROCEDURE: The procedure, risks, benefits, and alternatives were explained to the patient. Questions regarding the procedure were encouraged and answered. The patient understands and consents to the procedure. The right neck and chest were prepped with chlorhexidine in a sterile fashion, and a sterile drape was applied covering the operative field. Maximum barrier sterile technique with sterile gowns and gloves were used for the procedure. A timeout was performed prior to the initiation of the procedure. Local anesthesia was provided with 1% lidocaine with epinephrine. After creating a small venotomy incision, a micropuncture kit was utilized to access the internal jugular vein. Real-time ultrasound guidance was utilized for vascular access including the acquisition of a permanent ultrasound image documenting patency of the accessed vessel. The microwire was utilized to measure appropriate catheter length. A  subcutaneous port pocket was then created along the upper chest wall utilizing a combination of sharp and blunt dissection. The pocket was irrigated with sterile saline. A single lumen "standard sized" power injectable port was chosen for placement. The 8 Fr catheter was tunneled from the port pocket site to the venotomy incision. The port was placed in the pocket. The external catheter was trimmed to appropriate length. At the venotomy, an 8 Fr peel-away sheath was placed over a guidewire under fluoroscopic guidance. The catheter was then placed through the sheath and the sheath was removed. Final catheter positioning was confirmed and documented with a fluoroscopic spot radiograph. The port was accessed with a Huber needle, aspirated and flushed with heparinized saline. The venotomy site was closed with an interrupted 4-0 Vicryl suture. The port pocket incision was closed with interrupted 2-0 Vicryl suture. Dermabond and Steri-strips were applied to both incisions. Dressings were applied. The patient tolerated the procedure well without immediate post procedural complication. FINDINGS: After catheter placement, the tip lies  within the superior cavoatrial junction. The catheter aspirates and flushes normally and is ready for immediate use. IMPRESSION: Successful placement of a right internal jugular approach power injectable Port-A-Cath. The catheter is ready for immediate use. Electronically Signed   By: Sandi Mariscal M.D.   On: 05/17/2020 12:15   US THORACENTESIS ASP PLEURAL SPACE W/IMG GUIDE  Result Date: 05/06/2020 INDICATION: Shortness of breath. Newly found lung mass with right-sided pleural effusion. Request for diagnostic and therapeutic thoracentesis EXAM: ULTRASOUND GUIDED RIGHT THORACENTESIS MEDICATIONS: 1% plain lidocaine, 10 mL COMPLICATIONS: None immediate. PROCEDURE: An ultrasound guided thoracentesis was thoroughly discussed with the patient and questions answered. The benefits, risks,  alternatives and complications were also discussed. The patient understands and wishes to proceed with the procedure. Written consent was obtained. Ultrasound was performed to localize and mark an adequate pocket of fluid in the right chest. The area was then prepped and draped in the normal sterile fashion. 1% Lidocaine was used for local anesthesia. Under ultrasound guidance a 6 Fr Safe-T-Centesis catheter was introduced. Thoracentesis was performed. The catheter was removed and a dressing applied. FINDINGS: A total of approximately 1 L of blood-tinged fluid was removed. Samples were sent to the laboratory as requested by the clinical team. IMPRESSION: Successful ultrasound guided right thoracentesis yielding 1 L of pleural fluid. Read by: Ascencion Dike PA-C Electronically Signed   By: Markus Daft M.D.   On: 05/06/2020 16:11    PERFORMANCE STATUS (ECOG) : 1 - Symptomatic but completely ambulatory  Review of Systems Unless otherwise noted, a complete review of systems is negative.  Physical Exam General: NAD Pulmonary: Unlabored Extremities: no edema, no joint deformities Skin: no rashes Neurological: Weakness but otherwise nonfocal  IMPRESSION: I met with patient in the infusion area.  I introduced palliative care services and attempted to establish therapeutic rapport.  Patient reports that he is doing reasonably well.  He denies significant changes or concerns today.  No symptomatic complaints at present.  He does endorse exertional dyspnea but says that he is still able to maintain his own self-care.  He does have history of moderately sized right pleural effusion status post thoracentesis.  Monitor for need to repeat thoracentesis.  Hopefully, effusion will improve with treatment.  Patient reports having good social support from home.  He lives with his wife and daughter.  Patient will benefit from future conversation regarding advance directives.  PLAN: -Continue current scope of  treatment -Will benefit from ACP/MOST form during future visit -Follow-up MyChart in 1 month   Patient expressed understanding and was in agreement with this plan. He also understands that He can call the clinic at any time with any questions, concerns, or complaints.     Time Total: 15 minutes  Visit consisted of counseling and education dealing with the complex and emotionally intense issues of symptom management and palliative care in the setting of serious and potentially life-threatening illness.Greater than 50%  of this time was spent counseling and coordinating care related to the above assessment and plan.  Signed by: Altha Harm, PhD, NP-C

## 2020-05-23 NOTE — Progress Notes (Signed)
  Oncology Nurse Navigator Documentation  Navigator Location: [facility] Onc (05/23/20 1100)   )Navigator Encounter Type: Follow-up Appt;Treatment (05/23/20 1100)                   Treatment Initiated Date: 05/23/20 (05/23/20 1100) Patient Visit Type: MedOnc (05/23/20 1100) Treatment Phase: First Chemo Tx (05/23/20 1100) Barriers/Navigation Needs: Coordination of Care (05/23/20 1100)   Interventions: Coordination of Care (05/23/20 1100)   Coordination of Care: Pathology (05/23/20 1100)         met with patient and his wife prior to starting chemotherapy today. All questions answered during visit. Blood collected to send out for liquid biopsy with [employer]. Pt made aware that results should be available prior to his second cycle. Informed that will be given his follow up appts before leaving today. Instructed to call with any questions or needs prior to next appt. Pt verbalized understanding.          Time Spent with Patient: 60 (05/23/20 1100)

## 2020-05-23 NOTE — Progress Notes (Signed)
Having pain in right side. Thinks it could be from coughing. Not having pain currently due to taking ibuprofen. Wants to know why he can not take aleve. States he was told by [facility] not to take aleve. Wants to know about treatment plan and how many treatments he will need to get. Wants to make appointments on Tuesday. Has had bad SOB.

## 2020-05-23 NOTE — Research (Signed)
Research nurse and Mauricio Po, [place] met with the patient and his spouse this morning in the exam room. Briefly reviewed the [facility] path protocol again with the patient and spouse, the patient declines to participate at this time. He states if it's not going to help him he doesn't want to do it.   Jeral Fruit, RN 05/23/20 9:56 AM

## 2020-05-23 NOTE — Progress Notes (Signed)
[place] NOTE  Patient Care Team: Ezequiel Kayser, MD as PCP - General (Internal Medicine) Telford Nab, RN as Oncology Nurse Navigator  CHIEF COMPLAINTS/PURPOSE OF CONSULTATION: lung cancer     Oncology History Overview Note  May 07, 2020- CT chest [ER]..Centrally obstructing right upper lobe masses, right upper lobe nodular consolidation, large right pleural effusion and extensive pleural/extrapleural nodularity and mediastinal/right hilar adenopathy, findings most indicative of stage IV primary bronchogenic carcinoma.s/p RIGHT Thoracentesis- MALIGNANT CELLS PRESENT.  - METASTATIC NON SMALL CELL CARCINOMA, FAVOR ADENOCARCINOMA. FEB- 2022- MRI Brain NEG for parenchymal metastases; cervical bone met. PET-MARCH 2022-pleural-based metastases; pleural effusion question bone mets.    # MARCH 10th, 2022- [place] [awaiting NGS]  # Enlarged pulmonary arteries, indicative of pulmonary arterial Hypertension.  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; [place]]  # NGS/MOLECULAR TESTS:    # PALLIATIVE CARE EVALUATION:  # PAIN MANAGEMENT:    DIAGNOSIS: Lung cancer  STAGE:  IV       ;  GOALS: palliative  CURRENT/MOST RECENT THERAPY :     Cancer of upper lobe of right lung ([place])  05/13/2020 Initial Diagnosis   Cancer of upper lobe of right lung ([place])   05/13/2020 Cancer Staging   Staging form: Lung, AJCC 8th Edition - Clinical: Stage IVA (cT2, cN3, pM1b) - Signed by Cammie Sickle, MD on 05/13/2020 Histopathologic type: Adenocarcinoma, NOS   05/23/2020 -  Chemotherapy    Patient is on Treatment Plan: LUNG CARBOPLATIN / PEMETREXED / PEMBROLIZUMAB Q21D INDUCTION X 4 CYCLES / MAINTENANCE PEMETREXED + PEMBROLIZUMAB         HISTORY OF PRESENTING ILLNESS:  Fred Anderson 75 y.o.  male history lung cancer -adenocarcinoma stage IV is here proceed with chemotherapy/ review the results of the PET scan.  Patient complains of shortness of breath with  exertion.  However he states he is breathing okay while resting.  He continues to complain of coughing/and right chest wall pain especially nighttime.  Denies any headaches.  Denies any nausea vomiting.  Review of Systems  Constitutional: Positive for malaise/fatigue and weight loss. Negative for chills, diaphoresis and fever.  HENT: Negative for nosebleeds and sore throat.   Eyes: Negative for double vision.  Respiratory: Positive for cough and sputum production. Negative for hemoptysis, shortness of breath and wheezing.   Cardiovascular: Positive for chest pain. Negative for palpitations, orthopnea and leg swelling.  Gastrointestinal: Positive for diarrhea. Negative for abdominal pain, blood in stool, constipation, heartburn, melena, nausea and vomiting.  Genitourinary: Positive for hematuria. Negative for dysuria, frequency and urgency.  Musculoskeletal: Positive for back pain and joint pain.  Skin: Negative.  Negative for itching and rash.  Neurological: Positive for headaches. Negative for dizziness, tingling, focal weakness and weakness.  Endo/Heme/Allergies: Does not bruise/bleed easily.  Psychiatric/Behavioral: Negative for depression. The patient is not nervous/anxious and does not have insomnia.      MEDICAL HISTORY:  Past Medical History:  Diagnosis Date  . Arthritis   . Blood in semen   . BPH (benign prostatic hypertrophy)   . Cancer ([place])    melanoma on back  . DDD (degenerative disc disease), lumbar   . Diabetes mellitus without complication ([place])    TYPE 2  . Erectile dysfunction   . Hematospermia   . Hemorrhoids   . History of adenomatous polyp of colon   . History of kidney stones   . Hyperlipidemia   . Hypertension   . Kidney stones   . Obesity   .  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[place])   . Prediabetes     SURGICAL HISTORY: Past Surgical History:  Procedure Laterality Date  . BACK SURGERY  age 72   Broken back  . COLONOSCOPY WITH  PROPOFOL N/A 05/05/2017   Procedure: COLONOSCOPY WITH PROPOFOL;  Surgeon: Toledo, Benay Pike, MD;  Location: [facility] ENDOSCOPY;  Service: Gastroenterology;  Laterality: N/A;  . HEMORRHOID SURGERY    . IR IMAGING GUIDED PORT INSERTION  05/17/2020  . LAPAROSCOPIC APPENDECTOMY N/A 04/25/2018   Procedure: APPENDECTOMY LAPAROSCOPIC;  Surgeon: Herbert Pun, MD;  Location: [facility] ORS;  Service: General;  Laterality: N/A;  . SHOULDER ARTHROSCOPY WITH OPEN ROTATOR CUFF REPAIR Right 04/03/2019   Procedure: SHOULDER ARTHROSCOPY WITH SUBSACAPULARIS REPAIR, OPEN ROTATOR CUFF REPAIR, SUBARCOMIAL DECOMPRESSION, BICEPS TENODESIS;  Surgeon: Leim Fabry, MD;  Location: [facility] ORS;  Service: Orthopedics;  Laterality: Right;  . SMALL INTESTINE SURGERY      SOCIAL HISTORY: Social History   Socioeconomic History  . Marital status: Married    Spouse name: Not on file  . Number of children: Not on file  . Years of education: Not on file  . Highest education level: Not on file  Occupational History  . Not on file  Tobacco Use  . Smoking status: Former Smoker    Quit date: 08/15/1988    Years since quitting: 31.8  . Smokeless tobacco: Never Used  . Tobacco comment: quit september 11th 1991  Vaping Use  . Vaping Use: Never used  Substance and Sexual Activity  . Alcohol use: Yes    Alcohol/week: 12.0 standard drinks    Types: 12 Cans of beer per week  . Drug use: No  . Sexual activity: Not on file  Other Topics Concern  . Not on file  Social History Narrative   Quit smoking in 1991; used to smoke 3 ppd. Lives in [place]; with wife; and 2 daughters. 2-3 drink 4/ week. Worked in Tourist information centre manager; retired in 2013; raised cattle.    Social Determinants of Health   Financial Resource Strain: Not on file  Food Insecurity: Not on file  Transportation Needs: Not on file  Physical Activity: Not on file  Stress: Not on file  Social Connections: Not on file  Intimate Partner Violence: Not on file    FAMILY  HISTORY: Family History  Problem Relation Age of Onset  . Colon cancer Father   . Cancer Mother   . Liver cancer Sister   . Kidney cancer Brother   . Bone cancer Brother   . Kidney disease Neg Hx   . Prostate cancer Neg Hx   . Bladder Cancer Neg Hx     ALLERGIES:  is allergic to atorvastatin.  MEDICATIONS:  Current Outpatient Medications  Medication Sig Dispense Refill  . albuterol (VENTOLIN HFA) 108 (90 Base) MCG/ACT inhaler Inhale 2 puffs into the lungs every 6 (six) hours as needed for wheezing or shortness of breath. 1 each 2  . aspirin 325 MG tablet Take 325 mg by mouth daily.    . chlorpheniramine-HYDROcodone (TUSSIONEX) 10-8 MG/5ML SUER Take 5 mLs by mouth at bedtime as needed for cough. 836 mL 0  . folic acid (FOLVITE) 1 MG tablet Take 1 tablet (1 mg total) by mouth daily. 90 tablet 1  . lovastatin (MEVACOR) 40 MG tablet Take 40 mg by mouth at bedtime.   0  . metFORMIN (GLUCOPHAGE) 500 MG tablet Take 1,000 mg by mouth 2 (two) times daily.    . Multiple Vitamin (MULTIVITAMIN  WITH MINERALS) TABS tablet Take 1 tablet by mouth daily.    . Omega-3 Fatty Acids (FISH OIL) 500 MG CAPS Take 500 mg by mouth 2 (two) times daily.     . tamsulosin (FLOMAX) 0.4 MG CAPS capsule Take 1 capsule (0.4 mg total) by mouth daily. 90 capsule 3  . dexamethasone (DECADRON) 4 MG tablet Take one pill AM & PM x 2 days; one day before and one day after chemo. None on the day of chemo. (Patient not taking: Reported on 05/23/2020) 60 tablet 0  . lidocaine-prilocaine (EMLA) cream Apply 1 application topically as needed. 30 g 0  . ondansetron (ZOFRAN) 8 MG tablet One pill every 8 hours as needed for nausea/vomitting. (Patient not taking: Reported on 05/23/2020) 40 tablet 1  . prochlorperazine (COMPAZINE) 10 MG tablet Take 1 tablet (10 mg total) by mouth every 6 (six) hours as needed for nausea or vomiting. (Patient not taking: Reported on 05/23/2020) 40 tablet 1   No current facility-administered medications for  this visit.      Marland Kitchen  PHYSICAL EXAMINATION: ECOG PERFORMANCE STATUS: 1 - Symptomatic but completely ambulatory  Vitals:   05/23/20 0943  BP: 136/87  Pulse: 100  Temp: 97.8 F (36.6 C)  SpO2: 95%   Filed Weights   05/23/20 0943  Weight: 251 lb 3.2 oz (113.9 kg)    Physical Exam Constitutional:      Comments: Ambulating independently.  Accompanied by his wife.  HENT:     Head: Normocephalic and atraumatic.     Mouth/Throat:     Pharynx: No oropharyngeal exudate.  Eyes:     Pupils: Pupils are equal, round, and reactive to light.  Cardiovascular:     Rate and Rhythm: Normal rate and regular rhythm.  Pulmonary:     Effort: No respiratory distress.     Breath sounds: No wheezing.     Comments: Decreased breath on the right side compared to the left.  No wheeze or crackles. Abdominal:     General: Bowel sounds are normal. There is no distension.     Palpations: Abdomen is soft. There is no mass.     Tenderness: There is no abdominal tenderness. There is no guarding or rebound.  Musculoskeletal:        General: No tenderness. Normal range of motion.     Cervical back: Normal range of motion and neck supple.  Skin:    General: Skin is warm.  Neurological:     Mental Status: He is alert and oriented to person, place, and time.  Psychiatric:        Mood and Affect: Affect normal.      LABORATORY DATA:  I have reviewed the data as listed Lab Results  Component Value Date   WBC 8.5 05/23/2020   HGB 14.9 05/23/2020   HCT 43.6 05/23/2020   MCV 93.0 05/23/2020   PLT 219 05/23/2020   Recent Labs    05/06/20 0916 05/13/20 1308 05/23/20 0925  NA 135 136 134*  K 4.1 4.1 3.9  CL 101 100 97*  CO2 22 26 28   GLUCOSE 186* 147* 145*  BUN 11 12 8   CREATININE 0.69 0.69 0.66  CALCIUM 9.4 9.2 9.6  GFRNONAA >60 >60 >60  PROT  --  6.8 7.0  ALBUMIN  --  3.4* 3.4*  AST  --  24 28  ALT  --  14 13  ALKPHOS  --  70 76  BILITOT  --  0.7 0.7  RADIOGRAPHIC STUDIES: I  have personally reviewed the radiological images as listed and agreed with the findings in the report. DG Chest 2 View  Result Date: 05/06/2020 CLINICAL DATA:  Intermittent shortness of breath EXAM: CHEST - 2 VIEW COMPARISON:  2016 FINDINGS: Right pleural effusion. Likely partially loculated extending along the chest wall laterally. There is also fissural fluid. Adjacent right lung atelectasis. Left lung is clear. Heart size is normal. No acute osseous abnormality. IMPRESSION: Likely partially loculated small to moderate right pleural effusion. Adjacent atelectasis. Electronically Signed   By: Macy Mis M.D.   On: 05/06/2020 10:00   CT Angio Chest PE W and/or Wo Contrast  Result Date: 05/06/2020 CLINICAL DATA:  Intermittent shortness of breath and chest pain. EXAM: CT ANGIOGRAPHY CHEST WITH CONTRAST TECHNIQUE: Multidetector CT imaging of the chest was performed using the standard protocol during bolus administration of intravenous contrast. Multiplanar CT image reconstructions and MIPs were obtained to evaluate the vascular anatomy. CONTRAST:  183m OMNIPAQUE IOHEXOL 350 MG/ML SOLN COMPARISON:  10/16/2014. FINDINGS: Cardiovascular: Image quality is degraded by respiratory motion, limiting the evaluation of the segmental and subsegmental pulmonary arteries. Otherwise, no central or lobar pulmonary embolus. Atherosclerotic calcification of the aorta, aortic valve and coronary arteries. Right and left pulmonary arteries are enlarged as is the heart. No pericardial effusion. Mediastinum/Nodes: Mediastinal adenopathy measures up to 1.8 cm in the low right paratracheal station, new. Right hilar adenopathy measures up to approximately 1.5 cm. Numerous small prepericardiac/juxtadiaphragmatic lymph nodes are new. No left hilar or axillary adenopathy. Esophagus is grossly unremarkable. Lungs/Pleura: Image quality is markedly degraded by respiratory motion. Centrally obstructing perihilar right upper lobe masses  measure approximately 3.2 x 3.4 cm (6/37) and 2.7 x 4.8 cm (6/44), respectively. Postobstructive volume loss in the right upper lobe. Peripheral nodular consolidation in the apical right upper lobe measures 1.5 x 2.2 cm (6/25). Large right pleural effusion with pleural and extrapleural nodularity and thickening. Left lung is grossly clear. Airway is otherwise grossly unremarkable. Upper Abdomen: Visualized portions of the liver, adrenal glands, kidneys, spleen, pancreas, stomach and bowel are grossly unremarkable. Musculoskeletal: Degenerative changes in the spine. No worrisome lytic or sclerotic lesions. Old T12 compression deformity. Flowing anterior osteophytosis in the thoracic spine. Review of the MIP images confirms the above findings. IMPRESSION: 1. Image quality is degraded by expiratory phase imaging and respiratory motion, limiting the evaluation of segmental and subsegmental pulmonary arteries. Otherwise, no central or lobar pulmonary embolus. 2. 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 3. Aortic atherosclerosis (ICD10-I70.0). Coronary artery calcification. 4. Enlarged pulmonary arteries, indicative of pulmonary arterial hypertension. Electronically Signed   By: MLorin PicketM.D.   On: 05/06/2020 11:05   MR Brain W Wo Contrast  Result Date: 05/10/2020 CLINICAL DATA:  Non-small cell lung cancer.  Staging. EXAM: MRI HEAD WITHOUT AND WITH CONTRAST TECHNIQUE: Multiplanar, multiecho pulse sequences of the brain and surrounding structures were obtained without and with intravenous contrast. CONTRAST:  955mGADAVIST GADOBUTROL 1 MMOL/ML IV SOLN COMPARISON:  Head CT October 16, 2014. FINDINGS: Brain: No acute infarction, hemorrhage, hydrocephalus, extra-axial collection or mass  lesion. A few scattered foci of T2 hyperintensity within the white matter of the cerebral hemispheres, nonspecific, most likely related to mild chronic microangiopathic changes. Mild parenchymal volume loss. No focus of abnormal contrast enhancement. Vascular: Normal flow voids. Skull and upper cervical  spine: T1 hypointense lesion within the clivus with mild contrast enhancement. Sinuses/Orbits: Minimal mucosal thickening of the sphenoid sinuses. The orbits are maintained. IMPRESSION: 1. T1 hypointense lesion within the clivus with mild contrast enhancement. This may represent a metastatic lesion. No evidence of meningeal or parenchymal metastatic disease. 2. Mild chronic microangiopathic changes and parenchymal volume loss. Electronically Signed   By: Pedro Earls M.D.   On: 05/10/2020 10:37   NM PET Image Initial (PI) Skull Base To Thigh  Result Date: 05/15/2020 CLINICAL DATA:  Initial treatment strategy for non-small cell lung cancer. EXAM: NUCLEAR MEDICINE PET SKULL BASE TO THIGH TECHNIQUE: 12.9 mCi F-18 FDG was injected intravenously. Full-ring PET imaging was performed from the skull base to thigh after the radiotracer. CT data was obtained and used for attenuation correction and anatomic localization. Fasting blood glucose: 106 mg/dl COMPARISON:  CT chest 05/06/2020.  CT stone protocol 04/25/2018. FINDINGS: Mediastinal blood pool activity: SUV max 2.1 Liver activity: SUV max NA NECK: No hypermetabolic lymph nodes in the neck. Incidental CT findings: none CHEST: Anterior right upper lobe lesion measuring 2.2 cm on image 60/series 3 is hypermetabolic with SUV max = 5.6. low right paratracheal node is markedly hypermetabolic with SUV max = 9.5. This is a cyst with hypermetabolic disease in the right hilum and parahilar right upper lobe ( SUV max = 7.8. Multiple hypermetabolic right pleural lesions identified including index right paraspinal lesion with SUV max = 6.1 and 3.1 cm anterior  pleural lesion in the deep costophrenic sulcus (image 126/3) with SUV max = 5.6. No hypermetabolic nodule or mass noted in the left lung. Incidental CT findings: Coronary artery calcification is evident. Atherosclerotic calcification is noted in the wall of the thoracic aorta. Moderate right pleural effusion. ABDOMEN/PELVIS: No abnormal hypermetabolic activity within the liver, pancreas, adrenal glands, or spleen. No hypermetabolic lymph nodes in the abdomen or pelvis. Diffuse uptake in the colon is likely physiologic. Incidental CT findings: Diffuse low attenuation of the liver parenchyma is compatible with steatosis. Nonobstructing stones identified right kidney. Diverticular changes noted in the colon without diverticulitis. SKELETON: Scattered hypermetabolic foci are seen in the thoracolumbar spine. No associated lytic or sclerotic abnormality on CT imaging, but imaging features suspicious for bony metastatic involvement. Uptake in the L4 vertebral body demonstrates SUV max = 5.5. Incidental CT findings: none IMPRESSION: 1. Marked hypermetabolism in the right hilum and parahilar right upper lobe with hypermetabolic peripheral right upper lung lobe lung nodule, compatible with the patient's history of non-small-cell lung cancer. Multiple hypermetabolic pleural metastases are associated in right hemithorax. 2. Scattered hypermetabolic foci of uptake in the thoracolumbar spine. While no underlying CT findings are noted, PET imaging features are concerning for bony metastatic involvement. 3. Moderate right pleural effusion. 4.  Aortic Atherosclerois (ICD10-170.0) Electronically Signed   By: Misty Stanley M.D.   On: 05/15/2020 12:45   DG Chest Port 1 View  Result Date: 05/06/2020 CLINICAL DATA:  75 year old male status post right thoracentesis. EXAM: PORTABLE CHEST 1 VIEW COMPARISON:  Chest radiograph dated 05/06/2020 and CT dated 05/06/2020 FINDINGS: Slight interval decrease in the size of the right pleural  effusion compared to the prior radiograph. No pneumothorax. Faint linear lucency along the minor fissure was present on the prior CT and may be related to small subpleural blebs or tiny amount of air in the pleural space. Right perihilar density corresponding to the known mass. The left lung is clear. Stable cardiomediastinal silhouette. Atherosclerotic calcification of the aorta. Degenerative changes  of the spine. No acute osseous pathology. IMPRESSION: Slight interval decrease in the size of the right pleural effusion status post thoracentesis. No pneumothorax. Electronically Signed   By: Anner Crete M.D.   On: 05/06/2020 16:21   IR IMAGING GUIDED PORT INSERTION  Result Date: 05/17/2020 INDICATION: History of metastatic lung cancer. In need of durable intravenous access for chemotherapy administration. EXAM: IMPLANTED PORT A CATH PLACEMENT WITH ULTRASOUND AND FLUOROSCOPIC GUIDANCE COMPARISON:  PET-CT-05/14/2020 MEDICATIONS: None ANESTHESIA/SEDATION: Moderate (conscious) sedation was employed during this procedure. A total of Versed 1 mg and Fentanyl 50 mcg was administered intravenously. Moderate Sedation Time: 21 minutes. The patient's level of consciousness and vital signs were monitored continuously by radiology nursing throughout the procedure under my direct supervision. CONTRAST:  None FLUOROSCOPY TIME:  1 minute, 12 seconds (84.5 mGy) COMPLICATIONS: None immediate. PROCEDURE: The procedure, risks, benefits, and alternatives were explained to the patient. Questions regarding the procedure were encouraged and answered. The patient understands and consents to the procedure. The right neck and chest were prepped with chlorhexidine in a sterile fashion, and a sterile drape was applied covering the operative field. Maximum barrier sterile technique with sterile gowns and gloves were used for the procedure. A timeout was performed prior to the initiation of the procedure. Local anesthesia was provided with  1% lidocaine with epinephrine. After creating a small venotomy incision, a micropuncture kit was utilized to access the internal jugular vein. Real-time ultrasound guidance was utilized for vascular access including the acquisition of a permanent ultrasound image documenting patency of the accessed vessel. The microwire was utilized to measure appropriate catheter length. A subcutaneous port pocket was then created along the upper chest wall utilizing a combination of sharp and blunt dissection. The pocket was irrigated with sterile saline. A single lumen "standard sized" power injectable port was chosen for placement. The 8 Fr catheter was tunneled from the port pocket site to the venotomy incision. The port was placed in the pocket. The external catheter was trimmed to appropriate length. At the venotomy, an 8 Fr peel-away sheath was placed over a guidewire under fluoroscopic guidance. The catheter was then placed through the sheath and the sheath was removed. Final catheter positioning was confirmed and documented with a fluoroscopic spot radiograph. The port was accessed with a Huber needle, aspirated and flushed with heparinized saline. The venotomy site was closed with an interrupted 4-0 Vicryl suture. The port pocket incision was closed with interrupted 2-0 Vicryl suture. Dermabond and Steri-strips were applied to both incisions. Dressings were applied. The patient tolerated the procedure well without immediate post procedural complication. FINDINGS: After catheter placement, the tip lies within the superior cavoatrial junction. The catheter aspirates and flushes normally and is ready for immediate use. IMPRESSION: Successful placement of a right internal jugular approach power injectable Port-A-Cath. The catheter is ready for immediate use. Electronically Signed   By: Sandi Mariscal M.D.   On: 05/17/2020 12:15   US THORACENTESIS ASP PLEURAL SPACE W/IMG GUIDE  Result Date: 05/06/2020 INDICATION: Shortness of  breath. Newly found lung mass with right-sided pleural effusion. Request for diagnostic and therapeutic thoracentesis EXAM: ULTRASOUND GUIDED RIGHT THORACENTESIS MEDICATIONS: 1% plain lidocaine, 10 mL COMPLICATIONS: None immediate. PROCEDURE: An ultrasound guided thoracentesis was thoroughly discussed with the patient and questions answered. The benefits, risks, alternatives and complications were also discussed. The patient understands and wishes to proceed with the procedure. Written consent was obtained. Ultrasound was performed to localize and mark an adequate pocket of fluid in the right  chest. The area was then prepped and draped in the normal sterile fashion. 1% Lidocaine was used for local anesthesia. Under ultrasound guidance a 6 Fr Safe-T-Centesis catheter was introduced. Thoracentesis was performed. The catheter was removed and a dressing applied. FINDINGS: A total of approximately 1 L of blood-tinged fluid was removed. Samples were sent to the laboratory as requested by the clinical team. IMPRESSION: Successful ultrasound guided right thoracentesis yielding 1 L of pleural fluid. Read by: Ascencion Dike PA-C Electronically Signed   By: Markus Daft M.D.   On: 05/06/2020 16:11    ASSESSMENT & PLAN:   Cancer of upper lobe of right lung ([facility]) #Right lung adenocarcinoma/stage IV-PET scan- MARCH 2022-pleural-based metastases; pleural effusion question bone mets.   #Proceed with [place] Alim[place] today. Labs today reviewed;  acceptable for treatment today.  ; add Keytruda if targetable mutations are negative.  Awaiting NGS.  Patient understands treatments are palliative not curative.  #Right pleural effusion-question symptomatic; monitor closely if progressive symptoms of shortness of breath recommend thoracentesis.  # Cough/right chest wall pain-start Tussionex; discussed regarding constipation and also dizziness/not take while driving.  # ?  Bone metastasis noted on MRI brain-cervical spine; PET scan  also inconclusive; monitor for now-on subsequent imaging.  Recommend Zometa if complains of redness noted.    #Diabetes- HbA1c- 6.7 [Sep 2021]-we will need to monitor closely on treatments.;  Especially on dexamethasone around chemotherapy.  * Hold [place].  # DISPOSITION:  # chemo today  # follow up in 10 days- MD; labs- cb/cmp; possible IVFs  # in 3 weeks--MD; labs CBC CMP-carbo Alimta;Dr.B    All questions were answered. The patient knows to call the clinic with any problems, questions or concerns.    Cammie Sickle, MD 05/27/2020 12:45 PM

## 2020-05-23 NOTE — Assessment & Plan Note (Addendum)
#  Right lung adenocarcinoma/stage IV-PET scan- MARCH 2022-pleural-based metastases; pleural effusion question bone mets.   #Proceed with [place] Alimta today. Labs today reviewed;  acceptable for treatment today.  ; add Keytruda if targetable mutations are negative.  Awaiting NGS.  Patient understands treatments are palliative not curative.  #Right pleural effusion-question symptomatic; monitor closely if progressive symptoms of shortness of breath recommend thoracentesis.  # Cough/right chest wall pain-start Tussionex; discussed regarding constipation and also dizziness/not take while driving.  # ?  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  Recommend Zometa if complains of redness noted.    #Diabetes- HbA1c- 6.7 [Sep 2021]-we will need to monitor closely on treatments.;  Especially on dexamethasone around chemotherapy.  * Hold [place].  # DISPOSITION:  # chemo today  # follow up in 10 days- MD; labs- cb/cmp; possible IVFs  # in 3 weeks--MD; labs CBC CMP-carbo Alimta;Dr.B

## 2020-05-24 ENCOUNTER — Telehealth: Payer: Self-pay

## 2020-05-24 NOTE — Telephone Encounter (Signed)
Telephone call to patient for follow up after receiving first infusion.   Patient states infusion went great.  States eating good and drinking plenty of fluids.   Denies any nausea or vomiting.  Encouraged patient to call for any concerns or questions. 

## 2020-05-29 ENCOUNTER — Inpatient Hospital Stay: Payer: Medicare Other | Admitting: *Deleted

## 2020-05-29 DIAGNOSIS — C3490 Malignant neoplasm of unspecified part of unspecified bronchus or lung: Secondary | ICD-10-CM

## 2020-05-30 ENCOUNTER — Other Ambulatory Visit: Payer: Self-pay | Admitting: *Deleted

## 2020-05-30 DIAGNOSIS — C3490 Malignant neoplasm of unspecified part of unspecified bronchus or lung: Secondary | ICD-10-CM

## 2020-05-30 NOTE — Progress Notes (Signed)
Multidisciplinary Oncology Council Documentation  July Nickson Lacomb was presented by our [facility] on 05/29/2020, which included representatives from:  . Palliative Care . Dietitian  . Physical/Occupational Therapist . Nurse Navigator . Genetics . Speech Therapist . Social work . Survivorship RN . Hotel manager . Research RN . [place] currently presents with history of lung cancer.  We reviewed previous medical and familial history, history of present illness, and recent lab results along with all available histopathologic and imaging studies. The [place] considered available treatment options and made the following recommendations/referrals:  None at this time. Consider referral to dietitian if pt experiences weight loss or decreased appetite while on treatment.  The MOC is a meeting of clinicians from various specialty areas who evaluate and discuss patients for whom a multidisciplinary approach is being considered. Final determinations in the plan of care are those of the provider(s).   Today's extended care, comprehensive team conference, [place] was not present for the discussion and was not examined.

## 2020-06-03 ENCOUNTER — Inpatient Hospital Stay: Payer: Medicare Other

## 2020-06-03 ENCOUNTER — Other Ambulatory Visit: Payer: Self-pay

## 2020-06-03 ENCOUNTER — Encounter: Payer: Self-pay | Admitting: *Deleted

## 2020-06-03 ENCOUNTER — Inpatient Hospital Stay (HOSPITAL_BASED_OUTPATIENT_CLINIC_OR_DEPARTMENT_OTHER): Payer: Medicare Other | Admitting: Internal Medicine

## 2020-06-03 DIAGNOSIS — C3490 Malignant neoplasm of unspecified part of unspecified bronchus or lung: Secondary | ICD-10-CM

## 2020-06-03 DIAGNOSIS — C3411 Malignant neoplasm of upper lobe, right bronchus or lung: Secondary | ICD-10-CM | POA: Diagnosis not present

## 2020-06-03 LAB — COMPREHENSIVE METABOLIC PANEL
ALT: 24 U/L (ref 0–44)
AST: 24 U/L (ref 15–41)
Albumin: 3.3 g/dL — ABNORMAL LOW (ref 3.5–5.0)
Alkaline Phosphatase: 89 U/L (ref 38–126)
Anion gap: 10 (ref 5–15)
BUN: 14 mg/dL (ref 8–23)
CO2: 25 mmol/L (ref 22–32)
Calcium: 9.1 mg/dL (ref 8.9–10.3)
Chloride: 101 mmol/L (ref 98–111)
Creatinine, Ser: 0.75 mg/dL (ref 0.61–1.24)
GFR, Estimated: >60 mL/min (ref >60)
Glucose, Bld: 180 mg/dL — ABNORMAL HIGH (ref 70–99)
Potassium: 4.1 mmol/L (ref 3.5–5.1)
Sodium: 136 mmol/L (ref 135–145)
Total Bilirubin: 0.6 mg/dL (ref 0.3–1.2)
Total Protein: 6.6 g/dL (ref 6.5–8.1)

## 2020-06-03 LAB — CBC WITH DIFFERENTIAL/PLATELET
Abs Immature Granulocytes: 0.01 10*3/uL (ref 0.00–0.07)
Basophils Absolute: 0 10*3/uL (ref 0.0–0.1)
Basophils Relative: 1 %
Eosinophils Absolute: 0.1 10*3/uL (ref 0.0–0.5)
Eosinophils Relative: 3 %
HCT: 41.4 % (ref 39.0–52.0)
Hemoglobin: 13.6 g/dL (ref 13.0–17.0)
Immature Granulocytes: 0 %
Lymphocytes Relative: 33 %
Lymphs Abs: 1.3 10*3/uL (ref 0.7–4.0)
MCH: 31.1 pg (ref 26.0–34.0)
MCHC: 32.9 g/dL (ref 30.0–36.0)
MCV: 94.5 fL (ref 80.0–100.0)
Monocytes Absolute: 0.9 10*3/uL (ref 0.1–1.0)
Monocytes Relative: 21 %
Neutro Abs: 1.7 10*3/uL (ref 1.7–7.7)
Neutrophils Relative %: 42 %
Platelets: 180 10*3/uL (ref 150–400)
RBC: 4.38 MIL/uL (ref 4.22–5.81)
RDW: 12.6 % (ref 11.5–15.5)
WBC: 4 10*3/uL (ref 4.0–10.5)
nRBC: 0 % (ref 0.0–0.2)

## 2020-06-03 MED ORDER — SODIUM CHLORIDE 0.9% FLUSH
10.0000 mL | INTRAVENOUS | Status: DC | PRN
  Administered 2020-06-03: 10 mL via INTRAVENOUS
  Filled 2020-06-03: qty 10

## 2020-06-03 MED ORDER — BUDESONIDE-FORMOTEROL FUMARATE 160-4.5 MCG/ACT IN AERO: 2.0000 | INHALATION_SPRAY | Freq: Two times a day (BID) | RESPIRATORY_TRACT | 3 refills | Status: AC

## 2020-06-03 MED ORDER — HEPARIN SOD (PORK) LOCK FLUSH 100 UNIT/ML IV SOLN
500.0000 [IU] | Freq: Once | INTRAVENOUS | Status: AC
  Administered 2020-06-03: 500 [IU] via INTRAVENOUS
  Filled 2020-06-03: qty 5

## 2020-06-03 MED ORDER — TRAMADOL HCL 50 MG PO TABS: 50.0000 mg | ORAL_TABLET | Freq: Three times a day (TID) | ORAL | 0 refills | Status: DC | PRN

## 2020-06-03 NOTE — Progress Notes (Signed)
[place] NOTE  Patient Care Team: Ezequiel Kayser, MD as PCP - General (Internal Medicine) Telford Nab, RN as Oncology Nurse Navigator  CHIEF COMPLAINTS/PURPOSE OF CONSULTATION: lung cancer  Oncology History Overview Note  May 07, 2020- CT chest [ER]..Centrally obstructing right upper lobe masses, right upper lobe nodular consolidation, large right pleural effusion and extensive pleural/extrapleural nodularity and mediastinal/right hilar adenopathy, findings most indicative of stage IV primary bronchogenic carcinoma.s/p RIGHT Thoracentesis- MALIGNANT CELLS PRESENT.  - METASTATIC NON SMALL CELL CARCINOMA, FAVOR ADENOCARCINOMA. FEB- 2022- MRI Brain NEG for parenchymal metastases; cervical bone met. PET-MARCH 2022-pleural-based metastases; pleural effusion question bone mets.    # MARCH 10th, 2022- [place] [awaiting NGS]  # Enlarged pulmonary arteries, indicative of pulmonary arterial Hypertension.  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; [place]]  # NGS/MOLECULAR TESTS:    # PALLIATIVE CARE EVALUATION:  # PAIN MANAGEMENT:    DIAGNOSIS: Lung cancer  STAGE:  IV       ;  GOALS: palliative  CURRENT/MOST RECENT THERAPY :     Cancer of upper lobe of right lung ([place])  05/13/2020 Initial Diagnosis   Cancer of upper lobe of right lung ([place])   05/13/2020 Cancer Staging   Staging form: Lung, AJCC 8th Edition - Clinical: Stage IVA (cT2, cN3, pM1b) - Signed by Cammie Sickle, MD on 05/13/2020 Histopathologic type: Adenocarcinoma, NOS   05/23/2020 -  Chemotherapy    Patient is on Treatment Plan: LUNG CARBOPLATIN / PEMETREXED / PEMBROLIZUMAB Q21D INDUCTION X 4 CYCLES / MAINTENANCE PEMETREXED + PEMBROLIZUMAB         HISTORY OF PRESENTING ILLNESS:  Gaither C Kosar 75 y.o.  male history lung cancer -adenocarcinoma stage IV -currently on [place] Alimta chemotherapy s/p cycle #1 is here for follow-up.  Patient continues to complain of pain in his right  chest wall.  Denies any leg swelling.   Patient complains of shortness of breath with exertion.  However he states he is breathing okay while resting.  He continues to complain of coughing/and right chest wall pain especially nighttime.  Denies any headaches.  Denies any nausea vomiting.  Review of Systems  Constitutional: Positive for malaise/fatigue and weight loss. Negative for chills, diaphoresis and fever.  HENT: Negative for nosebleeds and sore throat.   Eyes: Negative for double vision.  Respiratory: Positive for cough and sputum production. Negative for hemoptysis, shortness of breath and wheezing.   Cardiovascular: Positive for chest pain. Negative for palpitations, orthopnea and leg swelling.  Gastrointestinal: Negative for abdominal pain, blood in stool, constipation, heartburn, melena, nausea and vomiting.  Genitourinary: Negative for dysuria, frequency and urgency.  Musculoskeletal: Positive for back pain and joint pain.  Skin: Negative.  Negative for itching and rash.  Neurological: Negative for dizziness, tingling, focal weakness and weakness.  Endo/Heme/Allergies: Does not bruise/bleed easily.  Psychiatric/Behavioral: Negative for depression. The patient is not nervous/anxious and does not have insomnia.      MEDICAL HISTORY:  Past Medical History:  Diagnosis Date   Arthritis    Blood in semen    BPH (benign prostatic hypertrophy)    Cancer (HCC)    melanoma on back   DDD (degenerative disc disease), lumbar    Diabetes mellitus without complication ([place])    TYPE 2   Erectile dysfunction    Hematospermia    Hemorrhoids    History of adenomatous polyp of colon    History of kidney stones    Hyperlipidemia    Hypertension    Kidney  stones    Obesity    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[place])    Prediabetes     SURGICAL HISTORY: Past Surgical History:  Procedure Laterality Date   BACK SURGERY  age 45   Broken back    COLONOSCOPY WITH PROPOFOL N/A 05/05/2017   Procedure: COLONOSCOPY WITH PROPOFOL;  Surgeon: Toledo, Benay Pike, MD;  Location: [facility] END[facility]COPY;  Service: Gastroenterology;  Laterality: N/A;   HEMORRHOID SURGERY     IR IMAGING GUIDED PORT INSERTION  05/17/2020   LAPAROSCOPIC APPENDECTOMY N/A 04/25/2018   Procedure: APPENDECTOMY LAPAROSCOPIC;  Surgeon: Herbert Pun, MD;  Location: [facility] ORS;  Service: General;  Laterality: N/A;   SHOULDER ARTHROSCOPY WITH OPEN ROTATOR CUFF REPAIR Right 04/03/2019   Procedure: SHOULDER ARTHROSCOPY WITH SUBSACAPULARIS REPAIR, OPEN ROTATOR CUFF REPAIR, SUBARCOMIAL DECOMPRESSION, BICEPS TENODESIS;  Surgeon: Leim Fabry, MD;  Location: [facility] ORS;  Service: Orthopedics;  Laterality: Right;   SMALL INTESTINE SURGERY      SOCIAL HISTORY: Social History   Socioeconomic History   Marital status: Married    Spouse name: Not on file   Number of children: Not on file   Years of education: Not on file   Highest education level: Not on file  Occupational History   Not on file  Tobacco Use   Smoking status: Former Smoker    Quit date: 08/15/1988    Years since quitting: 31.8   Smokeless tobacco: Never Used   Tobacco comment: quit september 11th 1991  Vaping Use   Vaping Use: Never used  Substance and Sexual Activity   Alcohol use: Yes    Alcohol/week: 12.0 standard drinks    Types: 12 Cans of beer per week   Drug use: No   Sexual activity: Not on file  Other Topics Concern   Not on file  Social History Narrative   Quit smoking in 1991; used to smoke 3 ppd. Lives in [place]; with wife; and 2 daughters. 2-3 drink 4/ week. Worked in Tourist information centre manager; retired in 2013; raised cattle.    Social Determinants of Health   Financial Resource Strain: Not on file  Food Insecurity: Not on file  Transportation Needs: Not on file  Physical Activity: Not on file  Stress: Not on file  Social Connections: Not on file  Intimate Partner Violence: Not on  file    FAMILY HISTORY: Family History  Problem Relation Age of Onset   Colon cancer Father    Cancer Mother    Liver cancer Sister    Kidney cancer Brother    Bone cancer Brother    Kidney disease Neg Hx    Prostate cancer Neg Hx    Bladder Cancer Neg Hx     ALLERGIES:  is allergic to atorvastatin.  MEDICATIONS:  Current Outpatient Medications  Medication Sig Dispense Refill   acetaminophen (TYLENOL) 500 MG tablet Take 1,000 mg by mouth every 6 (six) hours as needed.     albuterol (VENTOLIN HFA) 108 (90 Base) MCG/ACT inhaler Inhale 2 puffs into the lungs every 6 (six) hours as needed for wheezing or shortness of breath. 1 each 2   aspirin 325 MG tablet Take 325 mg by mouth daily.     budesonide-formoterol (SYMBICORT) 160-4.5 MCG/ACT inhaler Inhale 2 puffs into the lungs in the morning and at bedtime. 1 each 3   chlorpheniramine-HYDROcodone (TUSSIONEX) 10-8 MG/5ML SUER Take 5 mLs by mouth at bedtime as needed for cough. 224 mL 0   folic acid (FOLVITE) 1 MG tablet Take 1  tablet (1 mg total) by mouth daily. 90 tablet 1   lidocaine-prilocaine (EMLA) cream Apply 1 application topically as needed. 30 g 0   lovastatin (MEVACOR) 40 MG tablet Take 40 mg by mouth at bedtime.   0   metFORMIN (GLUCOPHAGE) 500 MG tablet Take 1,000 mg by mouth 2 (two) times daily.     Multiple Vitamin (MULTIVITAMIN WITH MINERALS) TABS tablet Take 1 tablet by mouth daily.     Omega-3 Fatty Acids (FISH OIL) 500 MG CAPS Take 500 mg by mouth 2 (two) times daily.      ondansetron (ZOFRAN) 8 MG tablet One pill every 8 hours as needed for nausea/vomitting. 40 tablet 1   prochlorperazine (COMPAZINE) 10 MG tablet Take 1 tablet (10 mg total) by mouth every 6 (six) hours as needed for nausea or vomiting. 40 tablet 1   tamsulosin (FLOMAX) 0.4 MG CAPS capsule Take 1 capsule (0.4 mg total) by mouth daily. 90 capsule 3   traMADol (ULTRAM) 50 MG tablet Take 1 tablet (50 mg total) by mouth every 8 (eight)  hours as needed. 60 tablet 0   dexamethasone (DECADRON) 4 MG tablet Take one pill AM & PM x 2 days; one day before and one day after chemo. None on the day of chemo. (Patient not taking: Reported on 06/03/2020) 60 tablet 0   No current facility-administered medications for this visit.   Facility-Administered Medications Ordered in Other Visits  Medication Dose Route Frequency Provider Last Rate Last Admin   sodium chloride flush (NS) 0.9 % injection 10 mL  10 mL Intravenous PRN Cammie Sickle, MD   10 mL at 06/03/20 1245      .  PHYSICAL EXAMINATION: ECOG PERFORMANCE STATUS: 1 - Symptomatic but completely ambulatory  Vitals:   06/03/20 1300  BP: 118/72  Pulse: (!) 112  Resp: (!) 22  Temp: 97.9 F (36.6 C)  SpO2: 93%   Filed Weights   06/03/20 1200  Weight: 244 lb (110.7 kg)    Physical Exam Constitutional:      Comments: Ambulating independently.  Accompanied by his wife.  HENT:     Head: Normocephalic and atraumatic.     Mouth/Throat:     Pharynx: No oropharyngeal exudate.  Eyes:     Pupils: Pupils are equal, round, and reactive to light.  Cardiovascular:     Rate and Rhythm: Normal rate and regular rhythm.  Pulmonary:     Effort: No respiratory distress.     Breath sounds: No wheezing.     Comments: Decreased breath on the right side compared to the left.  No wheeze or crackles. Abdominal:     General: Bowel sounds are normal. There is no distension.     Palpations: Abdomen is soft. There is no mass.     Tenderness: There is no abdominal tenderness. There is no guarding or rebound.  Musculoskeletal:        General: No tenderness. Normal range of motion.     Cervical back: Normal range of motion and neck supple.  Skin:    General: Skin is warm.  Neurological:     Mental Status: He is alert and oriented to person, place, and time.  Psychiatric:        Mood and Affect: Affect normal.      LABORATORY DATA:  I have reviewed the data as listed Lab  Results  Component Value Date   WBC 4.0 06/03/2020   HGB 13.6 06/03/2020   HCT 41.4 06/03/2020  MCV 94.5 06/03/2020   PLT 180 06/03/2020   Recent Labs    05/13/20 1308 05/23/20 0925 06/03/20 1241  NA 136 134* 136  K 4.1 3.9 4.1  CL 100 97* 101  CO2 26 28 25   GLUCOSE 147* 145* 180*  BUN 12 8 14   CREATININE 0.69 0.66 0.75  CALCIUM 9.2 9.6 9.1  GFRNONAA >60 >60 >60  PROT 6.8 7.0 6.6  ALBUMIN 3.4* 3.4* 3.3*  AST 24 28 24   ALT 14 13 24   ALKPHOS 70 76 89  BILITOT 0.7 0.7 0.6    RADIOGRAPHIC STUDIES: I have personally reviewed the radiological images as listed and agreed with the findings in the report. DG Chest 2 View  Result Date: 05/06/2020 CLINICAL DATA:  Intermittent shortness of breath EXAM: CHEST - 2 VIEW COMPARISON:  2016 FINDINGS: Right pleural effusion. Likely partially loculated extending along the chest wall laterally. There is also fissural fluid. Adjacent right lung atelectasis. Left lung is clear. Heart size is normal. No acute osseous abnormality. IMPRESSION: Likely partially loculated small to moderate right pleural effusion. Adjacent atelectasis. Electronically Signed   By: Macy Mis M.D.   On: 05/06/2020 10:00   CT Angio Chest PE W and/or Wo Contrast  Result Date: 05/06/2020 CLINICAL DATA:  Intermittent shortness of breath and chest pain. EXAM: CT ANGIOGRAPHY CHEST WITH CONTRAST TECHNIQUE: Multidetector CT imaging of the chest was performed using the standard protocol during bolus administration of intravenous contrast. Multiplanar CT image reconstructions and MIPs were obtained to evaluate the vascular anatomy. CONTRAST:  115m OMNIPAQUE IOHEXOL 350 MG/ML SOLN COMPARISON:  10/16/2014. FINDINGS: Cardiovascular: Image quality is degraded by respiratory motion, limiting the evaluation of the segmental and subsegmental pulmonary arteries. Otherwise, no central or lobar pulmonary embolus. Atherosclerotic calcification of the aorta, aortic valve and coronary  arteries. Right and left pulmonary arteries are enlarged as is the heart. No pericardial effusion. Mediastinum/Nodes: Mediastinal adenopathy measures up to 1.8 cm in the low right paratracheal station, new. Right hilar adenopathy measures up to approximately 1.5 cm. Numerous small prepericardiac/juxtadiaphragmatic lymph nodes are new. No left hilar or axillary adenopathy. Esophagus is grossly unremarkable. Lungs/Pleura: Image quality is markedly degraded by respiratory motion. Centrally obstructing perihilar right upper lobe masses measure approximately 3.2 x 3.4 cm (6/37) and 2.7 x 4.8 cm (6/44), respectively. Postobstructive volume loss in the right upper lobe. Peripheral nodular consolidation in the apical right upper lobe measures 1.5 x 2.2 cm (6/25). Large right pleural effusion with pleural and extrapleural nodularity and thickening. Left lung is grossly clear. Airway is otherwise grossly unremarkable. Upper Abdomen: Visualized portions of the liver, adrenal glands, kidneys, spleen, pancreas, stomach and bowel are grossly unremarkable. Musculoskeletal: Degenerative changes in the spine. No worrisome lytic or sclerotic lesions. Old T12 compression deformity. Flowing anterior osteophytosis in the thoracic spine. Review of the MIP images confirms the above findings. IMPRESSION: 1. Image quality is degraded by expiratory phase imaging and respiratory motion, limiting the evaluation of segmental and subsegmental pulmonary arteries. Otherwise, no central or lobar pulmonary embolus. 2. 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 3. Aortic atherosclerosis (ICD10-I70.0). Coronary artery calcification. 4. Enlarged pulmonary arteries, indicative of pulmonary arterial hypertension. Electronically  Signed   By: MLorin PicketM.D.   On: 05/06/2020 11:05   MR Brain W Wo Contrast  Result Date: 05/10/2020 CLINICAL DATA:  Non-small cell lung  cancer.  Staging. EXAM: MRI HEAD WITHOUT AND WITH CONTRAST TECHNIQUE: Multiplanar, multiecho pulse sequences of the brain and surrounding structures were obtained without and with intravenous contrast. CONTRAST:  38m GADAVIST GADOBUTROL 1 MMOL/ML IV SOLN COMPARISON:  Head CT October 16, 2014. FINDINGS: Brain: No acute infarction, hemorrhage, hydrocephalus, extra-axial collection or mass lesion. A few scattered foci of T2 hyperintensity within the white matter of the cerebral hemispheres, nonspecific, most likely related to mild chronic microangiopathic changes. Mild parenchymal volume loss. No focus of abnormal contrast enhancement. Vascular: Normal flow voids. Skull and upper cervical spine: T1 hypointense lesion within the clivus with mild contrast enhancement. Sinuses/Orbits: Minimal mucosal thickening of the sphenoid sinuses. The orbits are maintained. IMPRESSION: 1. T1 hypointense lesion within the clivus with mild contrast enhancement. This may represent a metastatic lesion. No evidence of meningeal or parenchymal metastatic disease. 2. Mild chronic microangiopathic changes and parenchymal volume loss. Electronically Signed   By: KPedro EarlsM.D.   On: 05/10/2020 10:37   NM PET Image Initial (PI) Skull Base To Thigh  Result Date: 05/15/2020 CLINICAL DATA:  Initial treatment strategy for non-small cell lung cancer. EXAM: NUCLEAR MEDICINE PET SKULL BASE TO THIGH TECHNIQUE: 12.9 mCi F-18 FDG was injected intravenously. Full-ring PET imaging was performed from the skull base to thigh after the radiotracer. CT data was obtained and used for attenuation correction and anatomic localization. Fasting blood glucose: 106 mg/dl COMPARISON:  CT chest 05/06/2020.  CT stone protocol 04/25/2018. FINDINGS: Mediastinal blood pool activity: SUV max 2.1 Liver  activity: SUV max NA NECK: No hypermetabolic lymph nodes in the neck. Incidental CT findings: none CHEST: Anterior right upper lobe lesion measuring 2.2 cm on image 60/series 3 is hypermetabolic with SUV max = 5.6. low right paratracheal node is markedly hypermetabolic with SUV max = 9.5. This is a cyst with hypermetabolic disease in the right hilum and parahilar right upper lobe ( SUV max = 7.8. Multiple hypermetabolic right pleural lesions identified including index right paraspinal lesion with SUV max = 6.1 and 3.1 cm anterior pleural lesion in the deep costophrenic sulcus (image 126/3) with SUV max = 5.6. No hypermetabolic nodule or mass noted in the left lung. Incidental CT findings: Coronary artery calcification is evident. Atherosclerotic calcification is noted in the wall of the thoracic aorta. Moderate right pleural effusion. ABDOMEN/PELVIS: No abnormal hypermetabolic activity within the liver, pancreas, adrenal glands, or spleen. No hypermetabolic lymph nodes in the abdomen or pelvis. Diffuse uptake in the colon is likely physiologic. Incidental CT findings: Diffuse low attenuation of the liver parenchyma is compatible with steatosis. Nonobstructing stones identified right kidney. Diverticular changes noted in the colon without diverticulitis. SKELETON: Scattered hypermetabolic foci are seen in the thoracolumbar spine. No associated lytic or sclerotic abnormality on CT imaging, but imaging features suspicious for bony metastatic involvement. Uptake in the L4 vertebral body demonstrates SUV max = 5.5. Incidental CT findings: none IMPRESSION: 1. Marked hypermetabolism in the right hilum and parahilar right upper lobe with hypermetabolic peripheral right upper lung lobe lung nodule, compatible with the patient's history of non-small-cell lung cancer. Multiple hypermetabolic pleural metastases are associated in right hemithorax. 2. Scattered hypermetabolic foci of uptake in the thoracolumbar spine. While no  underlying CT findings are noted, PET imaging features are concerning for bony metastatic involvement. 3. Moderate right pleural effusion. 4.  Aortic Atherosclerois (ICD10-170.0) Electronically Signed   By: EMisty StanleyM.D.   On: 05/15/2020 12:45   [facility]  Result  Date: 05/06/2020 CLINICAL DATA:  75 year old male status post right thoracentesis. EXAM: PORTABLE CHEST 1 VIEW COMPARISON:  Chest radiograph dated 05/06/2020 and CT dated 05/06/2020 FINDINGS: Slight interval decrease in the size of the right pleural effusion compared to the prior radiograph. No pneumothorax. Faint linear lucency along the minor fissure was present on the prior CT and may be related to small subpleural blebs or tiny amount of air in the pleural space. Right perihilar density corresponding to the known mass. The left lung is clear. Stable cardiomediastinal silhouette. Atherosclerotic calcification of the aorta. Degenerative changes of the spine. No acute osseous pathology. IMPRESSION: Slight interval decrease in the size of the right pleural effusion status post thoracentesis. No pneumothorax. Electronically Signed   By: Anner Crete M.D.   On: 05/06/2020 16:21   IR IMAGING GUIDED PORT INSERTION  Result Date: 05/17/2020 INDICATION: History of metastatic lung cancer. In need of durable intravenous access for chemotherapy administration. EXAM: IMPLANTED PORT A CATH PLACEMENT WITH ULTRASOUND AND FLUOROSCOPIC GUIDANCE COMPARISON:  PET-CT-05/14/2020 MEDICATIONS: None ANESTHESIA/SEDATION: Moderate (conscious) sedation was employed during this procedure. A total of Versed 1 mg and Fentanyl 50 mcg was administered intravenously. Moderate Sedation Time: 21 minutes. The patient's level of consciousness and vital signs were monitored continuously by radiology nursing throughout the procedure under my direct supervision. CONTRAST:  None FLUOROSCOPY TIME:  1 minute, 12 seconds (16.1 mGy) COMPLICATIONS: None immediate. PROCEDURE:  The procedure, risks, benefits, and alternatives were explained to the patient. Questions regarding the procedure were encouraged and answered. The patient understands and consents to the procedure. The right neck and chest were prepped with chlorhexidine in a sterile fashion, and a sterile drape was applied covering the operative field. Maximum barrier sterile technique with sterile gowns and gloves were used for the procedure. A timeout was performed prior to the initiation of the procedure. Local anesthesia was provided with 1% lidocaine with epinephrine. After creating a small venotomy incision, a micropuncture kit was utilized to access the internal jugular vein. Real-time ultrasound guidance was utilized for vascular access including the acquisition of a permanent ultrasound image documenting patency of the accessed vessel. The microwire was utilized to measure appropriate catheter length. A subcutaneous port pocket was then created along the upper chest wall utilizing a combination of sharp and blunt dissection. The pocket was irrigated with sterile saline. A single lumen "standard sized" power injectable port was chosen for placement. The 8 Fr catheter was tunneled from the port pocket site to the venotomy incision. The port was placed in the pocket. The external catheter was trimmed to appropriate length. At the venotomy, an 8 Fr peel-away sheath was placed over a guidewire under fluoroscopic guidance. The catheter was then placed through the sheath and the sheath was removed. Final catheter positioning was confirmed and documented with a fluoroscopic spot radiograph. The port was accessed with a Huber needle, aspirated and flushed with heparinized saline. The venotomy site was closed with an interrupted 4-0 Vicryl suture. The port pocket incision was closed with interrupted 2-0 Vicryl suture. Dermabond and Steri-strips were applied to both incisions. Dressings were applied. The patient tolerated the  procedure well without immediate post procedural complication. FINDINGS: After catheter placement, the tip lies within the superior cavoatrial junction. The catheter aspirates and flushes normally and is ready for immediate use. IMPRESSION: Successful placement of a right internal jugular approach power injectable Port-A-Cath. The catheter is ready for immediate use. Electronically Signed   By: Sandi Mariscal M.D.   On: 05/17/2020  12:15   US THORACENTESIS ASP PLEURAL SPACE W/IMG GUIDE  Result Date: 05/06/2020 INDICATION: Shortness of breath. Newly found lung mass with right-sided pleural effusion. Request for diagnostic and therapeutic thoracentesis EXAM: ULTRASOUND GUIDED RIGHT THORACENTESIS MEDICATIONS: 1% plain lidocaine, 10 mL COMPLICATIONS: None immediate. PROCEDURE: An ultrasound guided thoracentesis was thoroughly discussed with the patient and questions answered. The benefits, risks, alternatives and complications were also discussed. The patient understands and wishes to proceed with the procedure. Written consent was obtained. Ultrasound was performed to localize and mark an adequate pocket of fluid in the right chest. The area was then prepped and draped in the normal sterile fashion. 1% Lidocaine was used for local anesthesia. Under ultrasound guidance a 6 Fr Safe-T-Centesis catheter was introduced. Thoracentesis was performed. The catheter was removed and a dressing applied. FINDINGS: A total of approximately 1 L of blood-tinged fluid was removed. Samples were sent to the laboratory as requested by the clinical team. IMPRESSION: Successful ultrasound guided right thoracentesis yielding 1 L of pleural fluid. Read by: Ascencion Dike PA-C Electronically Signed   By: Markus Daft M.D.   On: 05/06/2020 16:11    ASSESSMENT & PLAN:   Cancer of upper lobe of right lung (HCC) #Right lung adenocarcinoma/stage IV-PET scan- MARCH 2022-pleural-based metastases; pleural effusion question bone mets. Liquid Biopsy-  guardant -pending.   # s/p carbo Alimta today; will add Keytruda if targetable mutations are negative. Awaiting on Guaradant testing.   # Right chest wall pain/cough- sec to pleural metastases/pleural effusion- recommend aleeve/add tramadol-new prescription sent.  Clinically not concerning for PE.  Continue Tussionex.  # ?  Bone metastasis noted on MRI brain-cervical spine; PET scan also inconclusive; monitor for now-on subsequent imaging; HOLD off zometa for now.   #Diabetes- HbA1c- 6.7 [Sep 2021]-monitor closely on dexamethasone.  # DISPOSITION: # as planned-MD; labs CBC CMP-carbo Alimta;-ADD Keytruda-Dr.B    All questions were answered. The patient knows to call the clinic with any problems, questions or concerns.    Cammie Sickle, MD 06/03/2020 1:47 PM

## 2020-06-03 NOTE — Assessment & Plan Note (Addendum)
#  Right lung adenocarcinoma/stage IV-PET scan- MARCH 2022-pleural-based metastases; pleural effusion question bone mets. Liquid Biopsy- guardant -pending.   # s/p carbo Alimta today; will add Keytruda if targetable mutations are negative. Awaiting on Guaradant testing.   # Right chest wall pain/cough- sec to pleural metastases/pleural effusion- recommend aleeve/add tramadol-new prescription sent.  Clinically not concerning for PE.  Continue Tussionex.  # ?  Bone metastasis noted on MRI brain-cervical spine; PET scan also inconclusive; monitor for now-on subsequent imaging; HOLD off zometa for now.   #Diabetes- HbA1c- 6.7 [Sep 2021]-monitor closely on dexamethasone.  # DISPOSITION: # as planned-MD; labs CBC CMP-carbo Alimta;-ADD Keytruda-Dr.B

## 2020-06-04 ENCOUNTER — Encounter: Payer: Self-pay | Admitting: Internal Medicine

## 2020-06-11 ENCOUNTER — Telehealth: Payer: Self-pay | Admitting: *Deleted

## 2020-06-11 ENCOUNTER — Other Ambulatory Visit: Payer: Self-pay | Admitting: *Deleted

## 2020-06-11 MED ORDER — OXYCODONE HCL 5 MG PO TABS: 5.0000 mg | ORAL_TABLET | Freq: Three times a day (TID) | ORAL | 0 refills | Status: DC | PRN

## 2020-06-11 NOTE — Telephone Encounter (Signed)
Hayley- please check with pt if he wants to try oxycodone 5 mg every 8 hours. If he wants to try- please let me know- I can send script-Thanks GB

## 2020-06-11 NOTE — Telephone Encounter (Signed)
Spoke with pt and he is agreeable in trying oxycodone as recommended. I have sent the prescription for you to sign and send to [facility] in [facility].

## 2020-06-11 NOTE — Telephone Encounter (Signed)
Pt called in to report has developed yeast infection to his groin area after taking tramadol. Yeast infection has cleared up at this time and also reports that he did not notice the tramadol helping relieve his pain either. States he has been only taking 2 aleve a day with tylenol to help manage his pain at this time. Asking if he needs something stronger or different than the tramadol to manage his pain so he does not have to take aleve.   Please advise.

## 2020-06-18 ENCOUNTER — Inpatient Hospital Stay (HOSPITAL_BASED_OUTPATIENT_CLINIC_OR_DEPARTMENT_OTHER): Payer: Medicare Other | Admitting: Internal Medicine

## 2020-06-18 ENCOUNTER — Inpatient Hospital Stay: Payer: Medicare Other

## 2020-06-18 ENCOUNTER — Inpatient Hospital Stay: Payer: Medicare Other | Attending: Internal Medicine

## 2020-06-18 ENCOUNTER — Encounter: Payer: Self-pay | Admitting: Internal Medicine

## 2020-06-18 ENCOUNTER — Other Ambulatory Visit: Payer: Self-pay

## 2020-06-18 VITALS — BP 121/72 | HR 118 | Temp 97.2°F | Resp 18 | Ht 71.0 in | Wt 246.0 lb

## 2020-06-18 DIAGNOSIS — C3411 Malignant neoplasm of upper lobe, right bronchus or lung: Secondary | ICD-10-CM

## 2020-06-18 DIAGNOSIS — M160 Bilateral primary osteoarthritis of hip: Secondary | ICD-10-CM | POA: Insufficient documentation

## 2020-06-18 DIAGNOSIS — C7951 Secondary malignant neoplasm of bone: Secondary | ICD-10-CM | POA: Diagnosis not present

## 2020-06-18 DIAGNOSIS — Z5111 Encounter for antineoplastic chemotherapy: Secondary | ICD-10-CM | POA: Insufficient documentation

## 2020-06-18 DIAGNOSIS — I10 Essential (primary) hypertension: Secondary | ICD-10-CM | POA: Insufficient documentation

## 2020-06-18 DIAGNOSIS — Z79899 Other long term (current) drug therapy: Secondary | ICD-10-CM | POA: Diagnosis not present

## 2020-06-18 DIAGNOSIS — E119 Type 2 diabetes mellitus without complications: Secondary | ICD-10-CM | POA: Diagnosis not present

## 2020-06-18 DIAGNOSIS — Z87442 Personal history of urinary calculi: Secondary | ICD-10-CM | POA: Insufficient documentation

## 2020-06-18 DIAGNOSIS — R5383 Other fatigue: Secondary | ICD-10-CM | POA: Diagnosis not present

## 2020-06-18 DIAGNOSIS — E785 Hyperlipidemia, unspecified: Secondary | ICD-10-CM | POA: Diagnosis not present

## 2020-06-18 DIAGNOSIS — Z87891 Personal history of nicotine dependence: Secondary | ICD-10-CM | POA: Diagnosis not present

## 2020-06-18 DIAGNOSIS — Z7982 Long term (current) use of aspirin: Secondary | ICD-10-CM | POA: Insufficient documentation

## 2020-06-18 DIAGNOSIS — Z7984 Long term (current) use of oral hypoglycemic drugs: Secondary | ICD-10-CM | POA: Insufficient documentation

## 2020-06-18 DIAGNOSIS — M79604 Pain in right leg: Secondary | ICD-10-CM | POA: Insufficient documentation

## 2020-06-18 DIAGNOSIS — N400 Enlarged prostate without lower urinary tract symptoms: Secondary | ICD-10-CM | POA: Diagnosis not present

## 2020-06-18 DIAGNOSIS — E871 Hypo-osmolality and hyponatremia: Secondary | ICD-10-CM | POA: Insufficient documentation

## 2020-06-18 DIAGNOSIS — M5441 Lumbago with sciatica, right side: Secondary | ICD-10-CM

## 2020-06-18 DIAGNOSIS — C782 Secondary malignant neoplasm of pleura: Secondary | ICD-10-CM | POA: Diagnosis not present

## 2020-06-18 DIAGNOSIS — M47816 Spondylosis without myelopathy or radiculopathy, lumbar region: Secondary | ICD-10-CM | POA: Diagnosis not present

## 2020-06-18 DIAGNOSIS — C3490 Malignant neoplasm of unspecified part of unspecified bronchus or lung: Secondary | ICD-10-CM

## 2020-06-18 LAB — CBC WITH DIFFERENTIAL/PLATELET
Abs Immature Granulocytes: 0.18 10*3/uL — ABNORMAL HIGH (ref 0.00–0.07)
Basophils Absolute: 0 10*3/uL (ref 0.0–0.1)
Basophils Relative: 0 %
Eosinophils Absolute: 0 10*3/uL (ref 0.0–0.5)
Eosinophils Relative: 0 %
HCT: 39.9 % (ref 39.0–52.0)
Hemoglobin: 13.5 g/dL (ref 13.0–17.0)
Immature Granulocytes: 2 %
Lymphocytes Relative: 16 %
Lymphs Abs: 1.8 10*3/uL (ref 0.7–4.0)
MCH: 30.9 pg (ref 26.0–34.0)
MCHC: 33.8 g/dL (ref 30.0–36.0)
MCV: 91.3 fL (ref 80.0–100.0)
Monocytes Absolute: 0.9 10*3/uL (ref 0.1–1.0)
Monocytes Relative: 8 %
Neutro Abs: 8.8 10*3/uL — ABNORMAL HIGH (ref 1.7–7.7)
Neutrophils Relative %: 74 %
Platelets: 213 10*3/uL (ref 150–400)
RBC: 4.37 MIL/uL (ref 4.22–5.81)
RDW: 13.1 % (ref 11.5–15.5)
WBC: 11.8 10*3/uL — ABNORMAL HIGH (ref 4.0–10.5)
nRBC: 0 % (ref 0.0–0.2)

## 2020-06-18 LAB — TSH: TSH: 3.017 u[IU]/mL (ref 0.350–4.500)

## 2020-06-18 LAB — COMPREHENSIVE METABOLIC PANEL
ALT: 16 U/L (ref 0–44)
AST: 28 U/L (ref 15–41)
Albumin: 3.5 g/dL (ref 3.5–5.0)
Alkaline Phosphatase: 174 U/L — ABNORMAL HIGH (ref 38–126)
Anion gap: 11 (ref 5–15)
BUN: 13 mg/dL (ref 8–23)
CO2: 22 mmol/L (ref 22–32)
Calcium: 9.2 mg/dL (ref 8.9–10.3)
Chloride: 98 mmol/L (ref 98–111)
Creatinine, Ser: 0.5 mg/dL — ABNORMAL LOW (ref 0.61–1.24)
GFR, Estimated: >60 mL/min (ref >60)
Glucose, Bld: 188 mg/dL — ABNORMAL HIGH (ref 70–99)
Potassium: 4.3 mmol/L (ref 3.5–5.1)
Sodium: 131 mmol/L — ABNORMAL LOW (ref 135–145)
Total Bilirubin: 0.7 mg/dL (ref 0.3–1.2)
Total Protein: 7.3 g/dL (ref 6.5–8.1)

## 2020-06-18 MED ORDER — SODIUM CHLORIDE 0.9 % IV SOLN
500.0000 mg/m2 | Freq: Once | INTRAVENOUS | Status: AC
  Administered 2020-06-18: 1200 mg via INTRAVENOUS
  Filled 2020-06-18: qty 40

## 2020-06-18 MED ORDER — HEPARIN SOD (PORK) LOCK FLUSH 100 UNIT/ML IV SOLN
500.0000 [IU] | Freq: Once | INTRAVENOUS | Status: AC
Start: 2020-06-18 — End: 2020-06-18
  Administered 2020-06-18: 500 [IU]
  Filled 2020-06-18: qty 5

## 2020-06-18 MED ORDER — FOSAPREPITANT DIMEGLUMINE INJECTION 150 MG
150.0000 mg | Freq: Once | INTRAVENOUS | Status: AC
  Administered 2020-06-18: 150 mg via INTRAVENOUS
  Filled 2020-06-18: qty 150

## 2020-06-18 MED ORDER — SODIUM CHLORIDE 0.9 % IV SOLN
Freq: Once | INTRAVENOUS | Status: AC
  Filled 2020-06-18: qty 250

## 2020-06-18 MED ORDER — HEPARIN SOD (PORK) LOCK FLUSH 100 UNIT/ML IV SOLN
INTRAVENOUS | Status: AC
  Filled 2020-06-18: qty 5

## 2020-06-18 MED ORDER — SODIUM CHLORIDE 0.9 % IV SOLN
200.0000 mg | Freq: Once | INTRAVENOUS | Status: AC
  Administered 2020-06-18: 200 mg via INTRAVENOUS
  Filled 2020-06-18: qty 8

## 2020-06-18 MED ORDER — SODIUM CHLORIDE 0.9 % IV SOLN
10.0000 mg | Freq: Once | INTRAVENOUS | Status: AC
Start: 2020-06-18 — End: 2020-06-18
  Administered 2020-06-18: 10 mg via INTRAVENOUS
  Filled 2020-06-18: qty 10

## 2020-06-18 MED ORDER — SODIUM CHLORIDE 0.9 % IV SOLN
647.0000 mg | Freq: Once | INTRAVENOUS | Status: AC
  Administered 2020-06-18: 650 mg via INTRAVENOUS
  Filled 2020-06-18: qty 65

## 2020-06-18 MED ORDER — GABAPENTIN 300 MG PO CAPS: ORAL_CAPSULE | ORAL | 0 refills | Status: DC

## 2020-06-18 MED ORDER — PALONOSETRON HCL INJECTION 0.25 MG/5ML
0.2500 mg | Freq: Once | INTRAVENOUS | Status: AC
Start: 2020-06-18 — End: 2020-06-18
  Administered 2020-06-18: 0.25 mg via INTRAVENOUS
  Filled 2020-06-18: qty 5

## 2020-06-18 NOTE — Progress Notes (Signed)
[facility] NOTE  Patient Care Team: Ezequiel Kayser, MD as PCP - General (Internal Medicine) Telford Nab, RN as Oncology Nurse Navigator  CHIEF COMPLAINTS/PURPOSE OF CONSULTATION: lung cancer  Oncology History Overview Note  May 07, 2020- CT chest [ER]..Centrally obstructing right upper lobe masses, right upper lobe nodular consolidation, large right pleural effusion and extensive pleural/extrapleural nodularity and mediastinal/right hilar adenopathy, findings most indicative of stage IV primary bronchogenic carcinoma.s/p RIGHT Thoracentesis- MALIGNANT CELLS PRESENT.  - METASTATIC NON SMALL CELL CARCINOMA, FAVOR ADENOCARCINOMA. FEB- 2022- MRI Brain NEG for parenchymal metastases; cervical bone met. PET-MARCH 2022-pleural-based metastases; pleural effusion question bone mets.    # MARCH 10th, 2022- [place]-Alimat [awaiting NGS]; # April 4th, 2022 cycles- carbo-alimta-keytruda  # Enlarged pulmonary arteries, indicative of pulmonary arterial Hypertension.  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; [place]]  # NGS/MOLECULAR TESTS:Guardant testing- ATM *; No other targetable mutation    # PALLIATIVE CARE EVALUATION:  # PAIN MANAGEMENT:    DIAGNOSIS: Lung cancer  STAGE:  IV       ;  GOALS: palliative  CURRENT/MOST RECENT THERAPY : carbo-alimta-keytruda    Cancer of upper lobe of right lung ([place])  05/13/2020 Initial Diagnosis   Cancer of upper lobe of right lung ([place])   05/13/2020 Cancer Staging   Staging form: Lung, AJCC 8th Edition - Clinical: Stage IVA (cT2, cN3, pM1b) - Signed by Cammie Sickle, MD on 05/13/2020 Histopathologic type: Adenocarcinoma, NOS   05/23/2020 -  Chemotherapy    Patient is on Treatment Plan: LUNG CARBOPLATIN / PEMETREXED / PEMBROLIZUMAB Q21D INDUCTION X 4 CYCLES / MAINTENANCE PEMETREXED + PEMBROLIZUMAB         HISTORY OF PRESENTING ILLNESS:  Evyn C Lomanto 75 y.o.  male history lung cancer -adenocarcinoma stage IV  -currently on [place] Alimta chemotherapy s/p cycle #1 is here for follow-up.  Patient states his right chest wall pain is improved.  Denies any leg swelling.  Denies any significant nausea vomiting.  Continues to shortness of breath especially exertion.  Patient complains of worsening pain in his right hip radiating down his right leg.  Denies any weakness.  Denies any falls.  Previously had been on gabapentin   Review of Systems  Constitutional: Positive for malaise/fatigue and weight loss. Negative for chills, diaphoresis and fever.  HENT: Negative for nosebleeds and sore throat.   Eyes: Negative for double vision.  Respiratory: Positive for cough and sputum production. Negative for hemoptysis, shortness of breath and wheezing.   Cardiovascular: Positive for chest pain. Negative for palpitations, orthopnea and leg swelling.  Gastrointestinal: Negative for abdominal pain, blood in stool, constipation, heartburn, melena, nausea and vomiting.  Genitourinary: Negative for dysuria, frequency and urgency.  Musculoskeletal: Positive for back pain and joint pain.  Skin: Negative.  Negative for itching and rash.  Neurological: Negative for dizziness, tingling, focal weakness and weakness.  Endo/Heme/Allergies: Does not bruise/bleed easily.  Psychiatric/Behavioral: Negative for depression. The patient is not nervous/anxious and does not have insomnia.      MEDICAL HISTORY:  Past Medical History:  Diagnosis Date  . Arthritis   . Blood in semen   . BPH (benign prostatic hypertrophy)   . Cancer ([place])    melanoma on back  . DDD (degenerative disc disease), lumbar   . Diabetes mellitus without complication ([place])    TYPE 2  . Erectile dysfunction   . Hematospermia   . Hemorrhoids   . History of adenomatous polyp of colon   . History of kidney  stones   . Hyperlipidemia   . Hypertension   . Kidney stones   . Obesity   . 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[place])   . Prediabetes      SURGICAL HISTORY: Past Surgical History:  Procedure Laterality Date  . BACK SURGERY  age 18   Broken back  . COLONOSCOPY WITH PROPOFOL N/A 05/05/2017   Procedure: COLONOSCOPY WITH PROPOFOL;  Surgeon: Toledo, Benay Pike, MD;  Location: [facility] ENDOSCOPY;  Service: Gastroenterology;  Laterality: N/A;  . HEMORRHOID SURGERY    . IR IMAGING GUIDED PORT INSERTION  05/17/2020  . LAPAROSCOPIC APPENDECTOMY N/A 04/25/2018   Procedure: APPENDECTOMY LAPAROSCOPIC;  Surgeon: Herbert Pun, MD;  Location: [facility] ORS;  Service: General;  Laterality: N/A;  . SHOULDER ARTHROSCOPY WITH OPEN ROTATOR CUFF REPAIR Right 04/03/2019   Procedure: SHOULDER ARTHROSCOPY WITH SUBSACAPULARIS REPAIR, OPEN ROTATOR CUFF REPAIR, SUBARCOMIAL DECOMPRESSION, BICEPS TENODESIS;  Surgeon: Leim Fabry, MD;  Location: [facility] ORS;  Service: Orthopedics;  Laterality: Right;  . SMALL INTESTINE SURGERY      SOCIAL HISTORY: Social History   Socioeconomic History  . Marital status: Married    Spouse name: Not on file  . Number of children: Not on file  . Years of education: Not on file  . Highest education level: Not on file  Occupational History  . Not on file  Tobacco Use  . Smoking status: Former Smoker    Quit date: 08/15/1988    Years since quitting: 31.8  . Smokeless tobacco: Never Used  . Tobacco comment: quit september 11th 1991  Vaping Use  . Vaping Use: Never used  Substance and Sexual Activity  . Alcohol use: Yes    Alcohol/week: 12.0 standard drinks    Types: 12 Cans of beer per week  . Drug use: No  . Sexual activity: Not on file  Other Topics Concern  . Not on file  Social History Narrative   Quit smoking in 1991; used to smoke 3 ppd. Lives in [place]; with wife; and 2 daughters. 2-3 drink 4/ week. Worked in Tourist information centre manager; retired in 2013; raised cattle.    Social Determinants of Health   Financial Resource Strain: Not on file  Food Insecurity: Not on file  Transportation Needs: Not on file   Physical Activity: Not on file  Stress: Not on file  Social Connections: Not on file  Intimate Partner Violence: Not on file    FAMILY HISTORY: Family History  Problem Relation Age of Onset  . Colon cancer Father   . Cancer Mother   . Liver cancer Sister   . Kidney cancer Brother   . Bone cancer Brother   . Kidney disease Neg Hx   . Prostate cancer Neg Hx   . Bladder Cancer Neg Hx     ALLERGIES:  is allergic to atorvastatin.  MEDICATIONS:  Current Outpatient Medications  Medication Sig Dispense Refill  . acetaminophen (TYLENOL) 500 MG tablet Take 1,000 mg by mouth every 6 (six) hours as needed.    Marland Kitchen albuterol (VENTOLIN HFA) 108 (90 Base) MCG/ACT inhaler Inhale 2 puffs into the lungs every 6 (six) hours as needed for wheezing or shortness of breath. 1 each 2  . aspirin 325 MG tablet Take 325 mg by mouth daily.    . budesonide-formoterol (SYMBICORT) 160-4.5 MCG/ACT inhaler Inhale 2 puffs into the lungs in the morning and at bedtime. 1 each 3  . chlorpheniramine-HYDROcodone (TUSSIONEX) 10-8 MG/5ML SUER Take 5 mLs by mouth at bedtime as needed for cough.  140 mL 0  . dexamethasone (DECADRON) 4 MG tablet Take one pill AM & PM x 2 days; one day before and one day after chemo. None on the day of chemo. 60 tablet 0  . folic acid (FOLVITE) 1 MG tablet Take 1 tablet (1 mg total) by mouth daily. 90 tablet 1  . gabapentin (NEURONTIN) 300 MG capsule Start take 1 pill at night x3 days; then one pill twice a day. 60 capsule 0  . lidocaine-prilocaine (EMLA) cream Apply 1 application topically as needed. 30 g 0  . lovastatin (MEVACOR) 40 MG tablet Take 40 mg by mouth at bedtime.   0  . metFORMIN (GLUCOPHAGE) 500 MG tablet Take 1,000 mg by mouth 2 (two) times daily.    . Multiple Vitamin (MULTIVITAMIN WITH MINERALS) TABS tablet Take 1 tablet by mouth daily.    . Omega-3 Fatty Acids (FISH OIL) 500 MG CAPS Take 500 mg by mouth 2 (two) times daily.     . ondansetron (ZOFRAN) 8 MG tablet One pill  every 8 hours as needed for nausea/vomitting. 40 tablet 1  . oxyCODONE (OXY IR/ROXICODONE) 5 MG immediate release tablet Take 1 tablet (5 mg total) by mouth every 8 (eight) hours as needed for severe pain. 30 tablet 0  . prochlorperazine (COMPAZINE) 10 MG tablet Take 1 tablet (10 mg total) by mouth every 6 (six) hours as needed for nausea or vomiting. 40 tablet 1  . tamsulosin (FLOMAX) 0.4 MG CAPS capsule Take 1 capsule (0.4 mg total) by mouth daily. 90 capsule 3  . traMADol (ULTRAM) 50 MG tablet Take 1 tablet (50 mg total) by mouth every 8 (eight) hours as needed. 60 tablet 0   No current facility-administered medications for this visit.   Facility-Administered Medications Ordered in Other Visits  Medication Dose Route Frequency Provider Last Rate Last Admin  . CARBOplatin (PARAPLATIN) 650 mg in sodium chloride 0.9 % 250 mL chemo infusion  650 mg Intravenous Once Cammie Sickle, MD 630 mL/hr at 06/18/20 1258 650 mg at 06/18/20 1258      .  PHYSICAL EXAMINATION: ECOG PERFORMANCE STATUS: 1 - Symptomatic but completely ambulatory  Vitals:   06/18/20 0912  BP: 121/72  Pulse: (!) 118  Resp: 18  Temp: (!) 97.2 F (36.2 C)  SpO2: 96%   Filed Weights   06/18/20 0912  Weight: 246 lb (111.6 kg)    Physical Exam Constitutional:      Comments: Ambulating independently.  Accompanied by his wife.  HENT:     Head: Normocephalic and atraumatic.     Mouth/Throat:     Pharynx: No oropharyngeal exudate.  Eyes:     Pupils: Pupils are equal, round, and reactive to light.  Cardiovascular:     Rate and Rhythm: Normal rate and regular rhythm.  Pulmonary:     Effort: No respiratory distress.     Breath sounds: No wheezing.     Comments: Decreased breath on the right side compared to the left.  No wheeze or crackles. Abdominal:     General: Bowel sounds are normal. There is no distension.     Palpations: Abdomen is soft. There is no mass.     Tenderness: There is no abdominal  tenderness. There is no guarding or rebound.  Musculoskeletal:        General: No tenderness. Normal range of motion.     Cervical back: Normal range of motion and neck supple.  Skin:    General: Skin is warm.  Neurological:     Mental Status: He is alert and oriented to person, place, and time.  Psychiatric:        Mood and Affect: Affect normal.      LABORATORY DATA:  I have reviewed the data as listed Lab Results  Component Value Date   WBC 11.8 (H) 06/18/2020   HGB 13.5 06/18/2020   HCT 39.9 06/18/2020   MCV 91.3 06/18/2020   PLT 213 06/18/2020   Recent Labs    05/23/20 0925 06/03/20 1241 06/18/20 0849  NA 134* 136 131*  K 3.9 4.1 4.3  CL 97* 101 98  CO2 _0 GLUCOSE 145* 180* 188*  BUN _1 CREATININE 0.66 0.75 0.50*  CALCIUM 9.6 9.1 9.2  GFRNONAA >60 >60 >60  PROT 7.0 6.6 7.3  ALBUMIN 3.4* 3.3* 3.5  AST _2 ALT _3 ALKPHOS 76 89 174*  BILITOT 0.7 0.6 0.7    RADIOGRAPHIC STUDIES: I have personally reviewed the radiological images as listed and agreed with the findings in the report. No results found.  ASSESSMENT & PLAN:   Cancer of upper lobe of right lung ([place]) #Right lung adenocarcinoma/stage IV-PET scan- MARCH 2022-pleural-based metastases; pleural effusion question bone mets. Liquid Biopsy- guardant -NEG for target mutations.  # s/p [place] Alimta-keytruda  today; Labs today reviewed;  acceptable for treatment today.   # Right chest wall pain/cough- sec to pleural metastases/pleural effusion-improved-  #Right hip pain/radiating to right leg with twitching and numbness.?Sciatica-benign versus malignancy.  Recommend ASAP MRI thoracic/lumbar spine.  # ?  Bone metastasis noted on MRI brain-cervical spine; PET scan also inconclusive; monitor for now-on subsequent imaging; HOLD off zometa for now; await MRI- T/L spine.  #Diabetes- HbA1c- 6.7 [Sep 2021]-monitor closely on dexamethasone.  I spoke at length with the patient's  family-wife and daughter- regarding the patient's clinical status/plan of care.  Family agreement.    # DISPOSITION: # MRI ASAP- # carbo-alimat-keytruda- # follow up in 1 week- MD: labs- cbc/bmp # in 3 weeks- MD; labs CBC CMP-carbo Alimta;- Keytruda-Dr.B    All questions were answered. The patient knows to call the clinic with any problems, questions or concerns.    Cammie Sickle, MD 06/18/2020 1:14 PM

## 2020-06-18 NOTE — Assessment & Plan Note (Addendum)
#  Right lung adenocarcinoma/stage IV-PET scan- MARCH 2022-pleural-based metastases; pleural effusion question bone mets. Liquid Biopsy- guardant -NEG for target mutations.  # s/p [place] Alimta-keytruda  today; Labs today reviewed;  acceptable for treatment today.   # Right chest wall pain/cough- sec to pleural metastases/pleural effusion-improved-  #Right hip pain/radiating to right leg with twitching and numbness.?Sciatica-benign versus malignancy.  Recommend ASAP MRI thoracic/lumbar spine.  # ?  Bone metastasis noted on MRI brain-cervical spine; PET scan also inconclusive; monitor for now-on subsequent imaging; HOLD off zometa for now; await MRI- T/L spine.  #Diabetes- HbA1c- 6.7 [Sep 2021]-monitor closely on dexamethasone.  I spoke at length with the patient's family-wife and daughter- regarding the patient's clinical status/plan of care.  Family agreement.    # DISPOSITION: # MRI ASAP- # carbo-alimat-keytruda- # follow up in 1 week- MD: labs- cbc/bmp # in 3 weeks- MD; labs CBC CMP-carbo Alimta;- Keytruda-Dr.B

## 2020-06-18 NOTE — Progress Notes (Signed)
HR 118, ok to proceed per MD

## 2020-06-18 NOTE — Progress Notes (Signed)
R leg nerve pain. Bilateral feet burning. Has problems with legs jumping at night.

## 2020-06-20 ENCOUNTER — Inpatient Hospital Stay (HOSPITAL_BASED_OUTPATIENT_CLINIC_OR_DEPARTMENT_OTHER): Payer: Medicare Other | Admitting: Hospice and Palliative Medicine

## 2020-06-20 DIAGNOSIS — Z515 Encounter for palliative care: Secondary | ICD-10-CM

## 2020-06-20 NOTE — Progress Notes (Signed)
Patient did not answer phone for scheduled MyChart visit.  Will reschedule.

## 2020-06-25 ENCOUNTER — Encounter: Payer: Self-pay | Admitting: *Deleted

## 2020-06-25 ENCOUNTER — Inpatient Hospital Stay (HOSPITAL_BASED_OUTPATIENT_CLINIC_OR_DEPARTMENT_OTHER): Payer: Medicare Other | Admitting: Internal Medicine

## 2020-06-25 ENCOUNTER — Inpatient Hospital Stay: Payer: Medicare Other

## 2020-06-25 DIAGNOSIS — C3411 Malignant neoplasm of upper lobe, right bronchus or lung: Secondary | ICD-10-CM

## 2020-06-25 DIAGNOSIS — C3490 Malignant neoplasm of unspecified part of unspecified bronchus or lung: Secondary | ICD-10-CM

## 2020-06-25 LAB — CBC WITH DIFFERENTIAL/PLATELET
Abs Immature Granulocytes: 0.02 10*3/uL (ref 0.00–0.07)
Basophils Absolute: 0.1 10*3/uL (ref 0.0–0.1)
Basophils Relative: 1 %
Eosinophils Absolute: 0.1 10*3/uL (ref 0.0–0.5)
Eosinophils Relative: 1 %
HCT: 38.4 % — ABNORMAL LOW (ref 39.0–52.0)
Hemoglobin: 12.7 g/dL — ABNORMAL LOW (ref 13.0–17.0)
Immature Granulocytes: 1 %
Lymphocytes Relative: 40 %
Lymphs Abs: 1.8 10*3/uL (ref 0.7–4.0)
MCH: 30.9 pg (ref 26.0–34.0)
MCHC: 33.1 g/dL (ref 30.0–36.0)
MCV: 93.4 fL (ref 80.0–100.0)
Monocytes Absolute: 0.7 10*3/uL (ref 0.1–1.0)
Monocytes Relative: 16 %
Neutro Abs: 1.8 10*3/uL (ref 1.7–7.7)
Neutrophils Relative %: 41 %
Platelets: 138 10*3/uL — ABNORMAL LOW (ref 150–400)
RBC: 4.11 MIL/uL — ABNORMAL LOW (ref 4.22–5.81)
RDW: 13.4 % (ref 11.5–15.5)
WBC: 4.4 10*3/uL (ref 4.0–10.5)
nRBC: 0 % (ref 0.0–0.2)

## 2020-06-25 LAB — COMPREHENSIVE METABOLIC PANEL
ALT: 44 U/L (ref 0–44)
AST: 45 U/L — ABNORMAL HIGH (ref 15–41)
Albumin: 3.3 g/dL — ABNORMAL LOW (ref 3.5–5.0)
Alkaline Phosphatase: 160 U/L — ABNORMAL HIGH (ref 38–126)
Anion gap: 11 (ref 5–15)
BUN: 23 mg/dL (ref 8–23)
CO2: 23 mmol/L (ref 22–32)
Calcium: 8.8 mg/dL — ABNORMAL LOW (ref 8.9–10.3)
Chloride: 102 mmol/L (ref 98–111)
Creatinine, Ser: 0.77 mg/dL (ref 0.61–1.24)
GFR, Estimated: >60 mL/min (ref >60)
Glucose, Bld: 117 mg/dL — ABNORMAL HIGH (ref 70–99)
Potassium: 4.5 mmol/L (ref 3.5–5.1)
Sodium: 136 mmol/L (ref 135–145)
Total Bilirubin: 0.5 mg/dL (ref 0.3–1.2)
Total Protein: 7 g/dL (ref 6.5–8.1)

## 2020-06-25 MED ORDER — CITALOPRAM HYDROBROMIDE 20 MG PO TABS: 20.0000 mg | ORAL_TABLET | Freq: Every day | ORAL | 2 refills | Status: DC

## 2020-06-25 NOTE — Assessment & Plan Note (Addendum)
#  Right lung adenocarcinoma/stage IV-PET scan- MARCH 2022-pleural-based metastases; pleural effusion question bone mets. Liquid Biopsy- guardant -NEG for target mutations.  #Patient currently s/p s/p cycle #2 carbo Alimta-keytruda x 1 week.  I had a long discussion with the patient regarding his adverse events related to chemotherapy [esp. Fatigue/depression].  I would recommend a CT scan chest to assess response before proceeding with next round of chemotherapy.  # Right chest wall pain/cough- sec to pleural metastases/pleural effusion-improved-as needed pain medication.  #Right hip pain/radiating to right leg with twitching and numbness.?Sciatica-benign versus malignancy.  On gabapentin significantly improved.  As per patient's preference HOLD off MRI thoracic/lumbar spine.  # ? Bone metastasis noted on MRI brain-cervical spine; PET scan also inconclusive; monitor for now.  Hold off any imaging at this time.  #Diabetes- HbA1c- 6.7 [Sep 2021]-monitor closely on dexamethasone.  #Extreme fatigue/depression-no concerns for any self-harm; or homicidal ideation.  Likely situational side effects of chemotherapy/and feeling of in general lack of control over the situation.  Counseled with patient at length regarding the expected side effects of chemotherapy.  Recommend starting the patient on Celexa 20 mg a day.  Also discussed regarding moving up CT scan prior to next visit.  # DISPOSITION: # Cancel the MRI # CT chest in 2 weeks/prior t next appt #as planned in 2 week  weeks- MD; labs CBC CMP-carbo Alimta;- Keytruda;  -Dr.B  # 40 minutes face-to-face with the patient discussing the above plan of care; more than 50% of time spent on prognosis/ natural history; counseling and coordination.

## 2020-06-25 NOTE — Progress Notes (Signed)
[facility] NOTE  Patient Care Team: Ezequiel Kayser, MD as PCP - General (Internal Medicine) Telford Nab, RN as Oncology Nurse Navigator  CHIEF COMPLAINTS/PURPOSE OF CONSULTATION: lung cancer  Oncology History Overview Note  May 07, 2020- CT chest [ER]..Centrally obstructing right upper lobe masses, right upper lobe nodular consolidation, large right pleural effusion and extensive pleural/extrapleural nodularity and mediastinal/right hilar adenopathy, findings most indicative of stage IV primary bronchogenic carcinoma.s/p RIGHT Thoracentesis- MALIGNANT CELLS PRESENT.  - METASTATIC NON SMALL CELL CARCINOMA, FAVOR ADENOCARCINOMA. FEB- 2022- MRI Brain NEG for parenchymal metastases; cervical bone met. PET-MARCH 2022-pleural-based metastases; pleural effusion question bone mets.    # MARCH 10th, 2022- [place]-Alimat [awaiting NGS]; # April 4th, 2022 cycles- carbo-alimta-keytruda  # Enlarged pulmonary arteries, indicative of pulmonary arterial Hypertension.  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; [place]]  # NGS/MOLECULAR TESTS:Guardant testing- ATM *; No other targetable mutation    # PALLIATIVE CARE EVALUATION:  # PAIN MANAGEMENT:    DIAGNOSIS: Lung cancer  STAGE:  IV       ;  GOALS: palliative  CURRENT/MOST RECENT THERAPY : carbo-alimta-keytruda    Cancer of upper lobe of right lung ([place])  05/13/2020 Initial Diagnosis   Cancer of upper lobe of right lung ([place])   05/13/2020 Cancer Staging   Staging form: Lung, AJCC 8th Edition - Clinical: Stage IVA (cT2, cN3, pM1b) - Signed by Cammie Sickle, MD on 05/13/2020 Histopathologic type: Adenocarcinoma, NOS   05/23/2020 -  Chemotherapy    Patient is on Treatment Plan: LUNG CARBOPLATIN / PEMETREXED / PEMBROLIZUMAB Q21D INDUCTION X 4 CYCLES / MAINTENANCE PEMETREXED + PEMBROLIZUMAB         HISTORY OF PRESENTING ILLNESS:  Roshad C Weingarten 75 y.o.  male history lung cancer -adenocarcinoma stage IV  -currently on [place] Alimta chemotherapy s/p cycle #2 is here for follow-up.  Patient notes to have improvement in the right chest wall pain.  Also improved shortness of breath and cough.  However patient is extremely fatigued-two-point he feels his quality of life is significantly deteriorated.  He has been more fatigued in the last 1 week post cycle #2.   Patient denies any nausea vomiting abdominal pain.  Denies any headaches.  Patient also improvement of his right hip radiating pain since being on gabapentin.   Review of Systems  Constitutional: Positive for malaise/fatigue and weight loss. Negative for chills, diaphoresis and fever.  HENT: Negative for nosebleeds and sore throat.   Eyes: Negative for double vision.  Respiratory: Positive for cough and sputum production. Negative for hemoptysis, shortness of breath and wheezing.   Cardiovascular: Positive for chest pain. Negative for palpitations, orthopnea and leg swelling.  Gastrointestinal: Negative for abdominal pain, blood in stool, constipation, heartburn, melena, nausea and vomiting.  Genitourinary: Negative for dysuria, frequency and urgency.  Musculoskeletal: Positive for back pain and joint pain.  Skin: Negative.  Negative for itching and rash.  Neurological: Negative for dizziness, tingling, focal weakness and weakness.  Endo/Heme/Allergies: Does not bruise/bleed easily.  Psychiatric/Behavioral: Negative for depression. The patient is not nervous/anxious and does not have insomnia.      MEDICAL HISTORY:  Past Medical History:  Diagnosis Date  . Arthritis   . Blood in semen   . BPH (benign prostatic hypertrophy)   . Cancer ([place])    melanoma on back  . DDD (degenerative disc disease), lumbar   . Diabetes mellitus without complication ([place])    TYPE 2  . Erectile dysfunction   . Hematospermia   .  Hemorrhoids   . History of adenomatous polyp of colon   . History of kidney stones   . Hyperlipidemia   . Hypertension    . Kidney stones   . Obesity   . 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[place])   . Prediabetes     SURGICAL HISTORY: Past Surgical History:  Procedure Laterality Date  . BACK SURGERY  age 15   Broken back  . COLONOSCOPY WITH PROPOFOL N/A 05/05/2017   Procedure: COLONOSCOPY WITH PROPOFOL;  Surgeon: Toledo, Benay Pike, MD;  Location: [facility] ENDOSCOPY;  Service: Gastroenterology;  Laterality: N/A;  . HEMORRHOID SURGERY    . IR IMAGING GUIDED PORT INSERTION  05/17/2020  . LAPAROSCOPIC APPENDECTOMY N/A 04/25/2018   Procedure: APPENDECTOMY LAPAROSCOPIC;  Surgeon: Herbert Pun, MD;  Location: [facility] ORS;  Service: General;  Laterality: N/A;  . SHOULDER ARTHROSCOPY WITH OPEN ROTATOR CUFF REPAIR Right 04/03/2019   Procedure: SHOULDER ARTHROSCOPY WITH SUBSACAPULARIS REPAIR, OPEN ROTATOR CUFF REPAIR, SUBARCOMIAL DECOMPRESSION, BICEPS TENODESIS;  Surgeon: Leim Fabry, MD;  Location: [facility] ORS;  Service: Orthopedics;  Laterality: Right;  . SMALL INTESTINE SURGERY      SOCIAL HISTORY: Social History   Socioeconomic History  . Marital status: Married    Spouse name: Not on file  . Number of children: Not on file  . Years of education: Not on file  . Highest education level: Not on file  Occupational History  . Not on file  Tobacco Use  . Smoking status: Former Smoker    Quit date: 08/15/1988    Years since quitting: 31.8  . Smokeless tobacco: Never Used  . Tobacco comment: quit september 11th 1991  Vaping Use  . Vaping Use: Never used  Substance and Sexual Activity  . Alcohol use: Yes    Alcohol/week: 12.0 standard drinks    Types: 12 Cans of beer per week  . Drug use: No  . Sexual activity: Not on file  Other Topics Concern  . Not on file  Social History Narrative   Quit smoking in 1991; used to smoke 3 ppd. Lives in [place]; with wife; and 2 daughters. 2-3 drink 4/ week. Worked in Tourist information centre manager; retired in 2013; raised cattle.    Social Determinants of Health    Financial Resource Strain: Not on file  Food Insecurity: Not on file  Transportation Needs: Not on file  Physical Activity: Not on file  Stress: Not on file  Social Connections: Not on file  Intimate Partner Violence: Not on file    FAMILY HISTORY: Family History  Problem Relation Age of Onset  . Colon cancer Father   . Cancer Mother   . Liver cancer Sister   . Kidney cancer Brother   . Bone cancer Brother   . Kidney disease Neg Hx   . Prostate cancer Neg Hx   . Bladder Cancer Neg Hx     ALLERGIES:  is allergic to atorvastatin.  MEDICATIONS:  Current Outpatient Medications  Medication Sig Dispense Refill  . acetaminophen (TYLENOL) 500 MG tablet Take 1,000 mg by mouth every 6 (six) hours as needed.    Marland Kitchen albuterol (VENTOLIN HFA) 108 (90 Base) MCG/ACT inhaler Inhale 2 puffs into the lungs every 6 (six) hours as needed for wheezing or shortness of breath. 1 each 2  . aspirin 325 MG tablet Take 325 mg by mouth daily.    . budesonide-formoterol (SYMBICORT) 160-4.5 MCG/ACT inhaler Inhale 2 puffs into the lungs in the morning and at bedtime. 1 each 3  .  chlorpheniramine-HYDROcodone (TUSSIONEX) 10-8 MG/5ML SUER Take 5 mLs by mouth at bedtime as needed for cough. 140 mL 0  . citalopram (CELEXA) 20 MG tablet Take 1 tablet (20 mg total) by mouth daily. 30 tablet 2  . dexamethasone (DECADRON) 4 MG tablet Take one pill AM & PM x 2 days; one day before and one day after chemo. None on the day of chemo. 60 tablet 0  . folic acid (FOLVITE) 1 MG tablet Take 1 tablet (1 mg total) by mouth daily. 90 tablet 1  . gabapentin (NEURONTIN) 300 MG capsule Start take 1 pill at night x3 days; then one pill twice a day. 60 capsule 0  . lidocaine-prilocaine (EMLA) cream Apply 1 application topically as needed. 30 g 0  . lovastatin (MEVACOR) 40 MG tablet Take 40 mg by mouth at bedtime.   0  . metFORMIN (GLUCOPHAGE) 500 MG tablet Take 1,000 mg by mouth 2 (two) times daily.    . Multiple Vitamin  (MULTIVITAMIN WITH MINERALS) TABS tablet Take 1 tablet by mouth daily.    . Omega-3 Fatty Acids (FISH OIL) 500 MG CAPS Take 500 mg by mouth 2 (two) times daily.     . ondansetron (ZOFRAN) 8 MG tablet One pill every 8 hours as needed for nausea/vomitting. 40 tablet 1  . oxyCODONE (OXY IR/ROXICODONE) 5 MG immediate release tablet Take 1 tablet (5 mg total) by mouth every 8 (eight) hours as needed for severe pain. 30 tablet 0  . prochlorperazine (COMPAZINE) 10 MG tablet Take 1 tablet (10 mg total) by mouth every 6 (six) hours as needed for nausea or vomiting. 40 tablet 1  . tamsulosin (FLOMAX) 0.4 MG CAPS capsule Take 1 capsule (0.4 mg total) by mouth daily. 90 capsule 3  . traMADol (ULTRAM) 50 MG tablet Take 1 tablet (50 mg total) by mouth every 8 (eight) hours as needed. (Patient not taking: Reported on 06/25/2020) 60 tablet 0   No current facility-administered medications for this visit.      Marland Kitchen  PHYSICAL EXAMINATION: ECOG PERFORMANCE STATUS: 1 - Symptomatic but completely ambulatory  Vitals:   06/25/20 1508  BP: 109/70  Pulse: (!) 101  Resp: 18  Temp: 97.8 F (36.6 C)  SpO2: 97%   Filed Weights   06/25/20 1508  Weight: 238 lb (108 kg)    Physical Exam Constitutional:      Comments: Ambulating independently.  Accompanied by his wife.  HENT:     Head: Normocephalic and atraumatic.     Mouth/Throat:     Pharynx: No oropharyngeal exudate.  Eyes:     Pupils: Pupils are equal, round, and reactive to light.  Cardiovascular:     Rate and Rhythm: Normal rate and regular rhythm.  Pulmonary:     Effort: No respiratory distress.     Breath sounds: No wheezing.     Comments: Decreased breath on the right side compared to the left.  No wheeze or crackles. Abdominal:     General: Bowel sounds are normal. There is no distension.     Palpations: Abdomen is soft. There is no mass.     Tenderness: There is no abdominal tenderness. There is no guarding or rebound.  Musculoskeletal:         General: No tenderness. Normal range of motion.     Cervical back: Normal range of motion and neck supple.  Skin:    General: Skin is warm.  Neurological:     Mental Status: He is alert and oriented  to person, place, and time.  Psychiatric:        Mood and Affect: Affect normal.      LABORATORY DATA:  I have reviewed the data as listed Lab Results  Component Value Date   WBC 4.4 06/25/2020   HGB 12.7 (L) 06/25/2020   HCT 38.4 (L) 06/25/2020   MCV 93.4 06/25/2020   PLT 138 (L) 06/25/2020   Recent Labs    06/03/20 1241 06/18/20 0849 06/25/20 1441  NA 136 131* 136  K 4.1 4.3 4.5  CL 101 98 102  CO2 _0 GLUCOSE 180* 188* 117*  BUN _1 CREATININE 0.75 0.50* 0.77  CALCIUM 9.1 9.2 8.8*  GFRNONAA >60 >60 >60  PROT 6.6 7.3 7.0  ALBUMIN 3.3* 3.5 3.3*  AST 24 28 45*  ALT 24 16 44  ALKPHOS 89 174* 160*  BILITOT 0.6 0.7 0.5    RADIOGRAPHIC STUDIES: I have personally reviewed the radiological images as listed and agreed with the findings in the report. No results found.  ASSESSMENT & PLAN:   Cancer of upper lobe of right lung ([place]) #Right lung adenocarcinoma/stage IV-PET scan- MARCH 2022-pleural-based metastases; pleural effusion question bone mets. Liquid Biopsy- guardant -NEG for target mutations.  #Patient currently s/p s/p cycle #2 carbo Alimta-keytruda x 1 week.  I had a long discussion with the patient regarding his adverse events related to chemotherapy [esp. Fatigue/depression].  I would recommend a CT scan chest to assess response before proceeding with next round of chemotherapy.  # Right chest wall pain/cough- sec to pleural metastases/pleural effusion-improved-as needed pain medication.  #Right hip pain/radiating to right leg with twitching and numbness.?Sciatica-benign versus malignancy.  On gabapentin significantly improved.  As per patient's preference HOLD off MRI thoracic/lumbar spine.  # ? Bone metastasis noted on MRI brain-cervical spine;  PET scan also inconclusive; monitor for now.  Hold off any imaging at this time.  #Diabetes- HbA1c- 6.7 [Sep 2021]-monitor closely on dexamethasone.  #Extreme fatigue/depression-no concerns for any self-harm; or homicidal ideation.  Likely situational side effects of chemotherapy/and feeling of in general lack of control over the situation.  Counseled with patient at length regarding the expected side effects of chemotherapy.  Recommend starting the patient on Celexa 20 mg a day.  Also discussed regarding moving up CT scan prior to next visit.  # DISPOSITION: # Cancel the MRI # CT chest in 2 weeks/prior t next appt #as planned in 2 week  weeks- MD; labs CBC CMP-carbo Alimta;- Keytruda;  -Dr.B  # 40 minutes face-to-face with the patient discussing the above plan of care; more than 50% of time spent on prognosis/ natural history; counseling and coordination.     All questions were answered. The patient knows to call the clinic with any problems, questions or concerns.    Cammie Sickle, MD 06/25/2020 7:57 PM

## 2020-06-26 NOTE — Progress Notes (Signed)
  Oncology Nurse Navigator Documentation  Navigator Location: [facility]Med Onc (06/25/20 1152)   )Navigator Encounter Type: Appt/Treatment Plan Review (06/25/20 1152)                     Patient Visit Type: MedOnc (06/25/20 1152)   Barriers/Navigation Needs: No Barriers At This Time (06/25/20 1152)   Interventions: None Required (06/25/20 1152)

## 2020-06-28 ENCOUNTER — Ambulatory Visit: Payer: Medicare Other

## 2020-07-03 ENCOUNTER — Telehealth: Payer: Self-pay | Admitting: *Deleted

## 2020-07-03 ENCOUNTER — Inpatient Hospital Stay: Payer: Medicare Other

## 2020-07-03 ENCOUNTER — Inpatient Hospital Stay (HOSPITAL_BASED_OUTPATIENT_CLINIC_OR_DEPARTMENT_OTHER): Payer: Medicare Other | Admitting: Oncology

## 2020-07-03 ENCOUNTER — Other Ambulatory Visit: Payer: Self-pay

## 2020-07-03 ENCOUNTER — Ambulatory Visit
Admission: RE | Admit: 2020-07-03 | Discharge: 2020-07-03 | Disposition: A | Discharge status: Home / Self Care | Payer: Medicare Other | Attending: Oncology | Admitting: Oncology

## 2020-07-03 ENCOUNTER — Encounter: Payer: Self-pay | Admitting: Oncology

## 2020-07-03 ENCOUNTER — Other Ambulatory Visit: Payer: Self-pay | Admitting: Oncology

## 2020-07-03 ENCOUNTER — Ambulatory Visit
Admission: RE | Admit: 2020-07-03 | Discharge: 2020-07-03 | Disposition: A | Discharge status: Home / Self Care | Payer: Medicare Other | Source: Ambulatory Visit | Attending: Oncology | Admitting: Oncology

## 2020-07-03 VITALS — BP 111/69 | HR 103 | Temp 96.3°F | Resp 17 | Ht 71.0 in | Wt 238.2 lb

## 2020-07-03 DIAGNOSIS — M5441 Lumbago with sciatica, right side: Secondary | ICD-10-CM

## 2020-07-03 DIAGNOSIS — C3411 Malignant neoplasm of upper lobe, right bronchus or lung: Secondary | ICD-10-CM | POA: Diagnosis not present

## 2020-07-03 MED ORDER — TIZANIDINE HCL 4 MG PO CAPS: 4.0000 mg | ORAL_CAPSULE | Freq: Three times a day (TID) | ORAL | 0 refills | Status: DC | PRN

## 2020-07-03 NOTE — Telephone Encounter (Signed)
Patient contacted. pt felt that pain be related to his sciatic nerve . pain running up and down on the right leg.   Apt made for 2pm with jenny in [facility]

## 2020-07-03 NOTE — Progress Notes (Signed)
Femur     Symptom Management Consult note [facility]  [phone] [phone]  Patient Care Team: Ezequiel Kayser, MD as PCP - General (Internal Medicine) Telford Nab, RN as Oncology Nurse Navigator   Name of the patient: Fred Anderson  [id number]  04-24-1945   Date of visit: 07/03/2020   Diagnosis-lung cancer  Chief complaint/ Reason for visit- Pain in right leg   Heme/Onc history: Fred Anderson is a 75 year old male with past medical history significant for hyperlipidemia, diabetes, degenerative disc disease and right upper lobe lung cancer.  He was diagnosed in February 2022 after being seen in the emergency room for shortness of breath prompting imaging which showed an obstructing right upper lobe mass and large right pleural effusion and extensive pleural extrapleural nodularity and mediastinal right hilar adenopathy findings most indicative of stage IV primary bronchogenic carcinoma.  Had a right thoracentesis which was positive for malignant cells.  MRI of his brain was negative for metastasis.  PET scan from March 2022 showed pleural-based metastasis pleural effusion and question bone mets.  He is followed by Dr. Rogue Bussing and is currently status post 2 cycles of carbo/Keytruda and Alimta.  His last treatment was on 06/18/2020.  Interval history-today, he presents to [facility] for concerns of right leg/hip pain since friday. He has had intermittent right hip pain with radiation down his leg for many years but this pain is different and has been constant.  Previous symptoms greatly improved with gabapentin.  On Friday afternoon, he developed similar pain starting in right anterior thigh radiating down his leg to his calf.  He has tried oxycodone, BC powder, Aleve and ibuprofen without relief.  It is causing insomnia.  Endorses improvement of similar pain several years with physical therapy. Denies injury. Does not like Oxycodone d/t drowsy feeling.  ECOG  FS:2 - Symptomatic, <50% confined to bed  Review of systems- Review of Systems  Constitutional: Negative.  Negative for chills, fever, malaise/fatigue and weight loss.  HENT: Negative for congestion, ear pain and tinnitus.   Eyes: Negative.  Negative for blurred vision and double vision.  Respiratory: Negative.  Negative for cough, sputum production and shortness of breath.   Cardiovascular: Negative.  Negative for chest pain, palpitations and leg swelling.  Gastrointestinal: Negative.  Negative for abdominal pain, constipation, diarrhea, nausea and vomiting.  Genitourinary: Negative for dysuria, frequency and urgency.  Musculoskeletal: Positive for back pain and joint pain. Negative for falls.  Skin: Negative.  Negative for rash.  Neurological: Negative.  Negative for weakness and headaches.  Endo/Heme/Allergies: Negative.  Does not bruise/bleed easily.  Psychiatric/Behavioral: Negative for depression. The patient has insomnia. The patient is not nervous/anxious.      Current treatment-status post 2 cycles of carbo/Keytruda and Alimta  Allergies  Allergen Reactions  . Atorvastatin Other (See Comments)    Muscle pain.     Past Medical History:  Diagnosis Date  . Arthritis   . Blood in semen   . BPH (benign prostatic hypertrophy)   . Cancer ([place])    melanoma on back  . DDD (degenerative disc disease), lumbar   . Diabetes mellitus without complication ([place])    TYPE 2  . Erectile dysfunction   . Hematospermia   . Hemorrhoids   . History of adenomatous polyp of colon   . History of kidney stones   . Hyperlipidemia   . Hypertension   . Kidney stones   . Obesity   . 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[place])   .  Prediabetes      Past Surgical History:  Procedure Laterality Date  . BACK SURGERY  age 79   Broken back  . COLONOSCOPY WITH PROPOFOL N/A 05/05/2017   Procedure: COLONOSCOPY WITH PROPOFOL;  Surgeon: Toledo, Benay Pike, MD;  Location: [facility] ENDOSCOPY;   Service: Gastroenterology;  Laterality: N/A;  . HEMORRHOID SURGERY    . IR IMAGING GUIDED PORT INSERTION  05/17/2020  . LAPAROSCOPIC APPENDECTOMY N/A 04/25/2018   Procedure: APPENDECTOMY LAPAROSCOPIC;  Surgeon: Herbert Pun, MD;  Location: [facility] ORS;  Service: General;  Laterality: N/A;  . SHOULDER ARTHROSCOPY WITH OPEN ROTATOR CUFF REPAIR Right 04/03/2019   Procedure: SHOULDER ARTHROSCOPY WITH SUBSACAPULARIS REPAIR, OPEN ROTATOR CUFF REPAIR, SUBARCOMIAL DECOMPRESSION, BICEPS TENODESIS;  Surgeon: Leim Fabry, MD;  Location: [facility] ORS;  Service: Orthopedics;  Laterality: Right;  . SMALL INTESTINE SURGERY      Social History   Socioeconomic History  . Marital status: Married    Spouse name: Not on file  . Number of children: Not on file  . Years of education: Not on file  . Highest education level: Not on file  Occupational History  . Not on file  Tobacco Use  . Smoking status: Former Smoker    Quit date: 08/15/1988    Years since quitting: 31.9  . Smokeless tobacco: Never Used  . Tobacco comment: quit september 11th 1991  Vaping Use  . Vaping Use: Never used  Substance and Sexual Activity  . Alcohol use: Yes    Alcohol/week: 12.0 standard drinks    Types: 12 Cans of beer per week  . Drug use: No  . Sexual activity: Not on file  Other Topics Concern  . Not on file  Social History Narrative   Quit smoking in 1991; used to smoke 3 ppd. Lives in [place]; with wife; and 2 daughters. 2-3 drink 4/ week. Worked in Tourist information centre manager; retired in 2013; raised cattle.    Social Determinants of Health   Financial Resource Strain: Not on file  Food Insecurity: Not on file  Transportation Needs: Not on file  Physical Activity: Not on file  Stress: Not on file  Social Connections: Not on file  Intimate Partner Violence: Not on file    Family History  Problem Relation Age of Onset  . Colon cancer Father   . Cancer Mother   . Liver cancer Sister   . Kidney cancer Brother   .  Bone cancer Brother   . Kidney disease Neg Hx   . Prostate cancer Neg Hx   . Bladder Cancer Neg Hx      Current Outpatient Medications:  .  acetaminophen (TYLENOL) 500 MG tablet, Take 1,000 mg by mouth every 6 (six) hours as needed., Disp: , Rfl:  .  albuterol (VENTOLIN HFA) 108 (90 Base) MCG/ACT inhaler, Inhale 2 puffs into the lungs every 6 (six) hours as needed for wheezing or shortness of breath., Disp: 1 each, Rfl: 2 .  aspirin 325 MG tablet, Take 325 mg by mouth daily., Disp: , Rfl:  .  budesonide-formoterol (SYMBICORT) 160-4.5 MCG/ACT inhaler, Inhale 2 puffs into the lungs in the morning and at bedtime., Disp: 1 each, Rfl: 3 .  chlorpheniramine-HYDROcodone (TUSSIONEX) 10-8 MG/5ML SUER, Take 5 mLs by mouth at bedtime as needed for cough., Disp: 140 mL, Rfl: 0 .  citalopram (CELEXA) 20 MG tablet, Take 1 tablet (20 mg total) by mouth daily., Disp: 30 tablet, Rfl: 2 .  dexamethasone (DECADRON) 4 MG tablet, Take one pill AM & PM  x 2 days; one day before and one day after chemo. None on the day of chemo., Disp: 60 tablet, Rfl: 0 .  folic acid (FOLVITE) 1 MG tablet, Take 1 tablet (1 mg total) by mouth daily., Disp: 90 tablet, Rfl: 1 .  gabapentin (NEURONTIN) 300 MG capsule, Start take 1 pill at night x3 days; then one pill twice a day., Disp: 60 capsule, Rfl: 0 .  lidocaine-prilocaine (EMLA) cream, Apply 1 application topically as needed., Disp: 30 g, Rfl: 0 .  lovastatin (MEVACOR) 40 MG tablet, Take 40 mg by mouth at bedtime. , Disp: , Rfl: 0 .  metFORMIN (GLUCOPHAGE) 500 MG tablet, Take 1,000 mg by mouth 2 (two) times daily., Disp: , Rfl:  .  Multiple Vitamin (MULTIVITAMIN WITH MINERALS) TABS tablet, Take 1 tablet by mouth daily., Disp: , Rfl:  .  Omega-3 Fatty Acids (FISH OIL) 500 MG CAPS, Take 500 mg by mouth 2 (two) times daily. , Disp: , Rfl:  .  ondansetron (ZOFRAN) 8 MG tablet, One pill every 8 hours as needed for nausea/vomitting., Disp: 40 tablet, Rfl: 1 .  oxyCODONE (OXY  IR/ROXICODONE) 5 MG immediate release tablet, Take 1 tablet (5 mg total) by mouth every 8 (eight) hours as needed for severe pain., Disp: 30 tablet, Rfl: 0 .  prochlorperazine (COMPAZINE) 10 MG tablet, Take 1 tablet (10 mg total) by mouth every 6 (six) hours as needed for nausea or vomiting., Disp: 40 tablet, Rfl: 1 .  tamsulosin (FLOMAX) 0.4 MG CAPS capsule, Take 1 capsule (0.4 mg total) by mouth daily., Disp: 90 capsule, Rfl: 3 .  baclofen (LIORESAL) 20 MG tablet, Take 1 tablet (20 mg total) by mouth 3 (three) times daily., Disp: 30 each, Rfl: 0  Physical exam:  Vitals:   07/03/20 1349  BP: 111/69  Pulse: (!) 103  Resp: 17  Temp: (!) 96.3 F (35.7 C)  SpO2: 92%  Weight: 238 lb 3.2 oz (108 kg)  Height: 5\' 11"  (1.803 m)   Physical Exam Constitutional:      Appearance: Normal appearance.  HENT:     Head: Normocephalic and atraumatic.  Eyes:     Pupils: Pupils are equal, round, and reactive to light.  Cardiovascular:     Rate and Rhythm: Normal rate and regular rhythm.     Heart sounds: Normal heart sounds. No murmur heard.   Pulmonary:     Effort: Pulmonary effort is normal.     Breath sounds: Normal breath sounds. No wheezing.  Abdominal:     General: Bowel sounds are normal. There is no distension.     Palpations: Abdomen is soft.     Tenderness: There is no abdominal tenderness.  Musculoskeletal:        General: Normal range of motion.     Cervical back: Normal range of motion.     Right upper leg: Tenderness and bony tenderness present.     Right lower leg: Tenderness present.  Skin:    General: Skin is warm and dry.     Findings: No rash.  Neurological:     Mental Status: He is alert and oriented to person, place, and time.  Psychiatric:        Judgment: Judgment normal.      CMP Latest Ref Rng & Units 06/25/2020  Glucose 70 - 99 mg/dL 117(H)  BUN 8 - 23 mg/dL 23  Creatinine 0.61 - 1.24 mg/dL 0.77  Sodium 135 - 145 mmol/L 136  Potassium 3.5 - 5.1 mmol/L  4.5   Chloride 98 - 111 mmol/L 102  CO2 22 - 32 mmol/L 23  Calcium 8.9 - 10.3 mg/dL 8.8(L)  Total Protein 6.5 - 8.1 g/dL 7.0  Total Bilirubin 0.3 - 1.2 mg/dL 0.5  Alkaline Phos 38 - 126 U/L 160(H)  AST 15 - 41 U/L 45(H)  ALT 0 - 44 U/L 44   CBC Latest Ref Rng & Units 06/25/2020  WBC 4.0 - 10.5 K/uL 4.4  Hemoglobin 13.0 - 17.0 g/dL 12.7(L)  Hematocrit 39.0 - 52.0 % 38.4(L)  Platelets 150 - 400 K/uL 138(L)    No images are attached to the encounter.  DG HIP UNILAT WITH PELVIS 2-3 VIEWS RIGHT  Result Date: 07/03/2020 CLINICAL DATA:  Right hip and upper leg pain since 06/28/2020. History of lung cancer. No known injury. EXAM: DG HIP (WITH OR WITHOUT PELVIS) 2-3V RIGHT COMPARISON:  None. FINDINGS: There is no evidence of hip fracture or dislocation. Mild degenerative change about the hips appear symmetric from right to left. Lower lumbar spondylosis is incompletely evaluated. Soft tissues are negative. IMPRESSION: No acute abnormality. Mild appearing bilateral hip osteoarthritis. Lower lumbar degenerative change also noted. Electronically Signed   By: Inge Rise M.D.   On: 07/03/2020 16:41   DG FEMUR, MIN 2 VIEWS RIGHT  Result Date: 07/03/2020 CLINICAL DATA:  Right hip and upper leg pain since 06/28/2020. History of lung cancer. No known injury. EXAM: RIGHT FEMUR 2 VIEWS COMPARISON:  None. FINDINGS: There is no evidence of fracture or other focal bone lesions. Soft tissues are unremarkable. IMPRESSION: Negative exam. Electronically Signed   By: Inge Rise M.D.   On: 07/03/2020 16:43     Assessment and plan- Patient is a 75 y.o. male who presents to symptom management for concerns of a right sided hip pain for the past 2 to 3 days.  Stage IV lung cancer: -Diagnosed in February 2022 when he presented to the emergency room for shortness of breath. -Imaging showed large right-sided pleural effusion.  Thoracentesis confirmed malignant cells. -MRI of brain negative.  PET scan showed  pleural-based metastasis pleural effusion.  Questionable cervical spine metastasis. -Status post 2 cycles of carbo/Keytruda plus Alimta. -Appears to be tolerating well. -He is scheduled for repeat CT chest on 07/05/2020 follow-up with Dr. Rogue Bussing on 07/09/2020.  Right-sided thigh pain with radiation: -Per patient, has had intermittent similar pain in the past. -Previously on gabapentin with significant improvement. -Has tried [facility] powder, Tylenol, oxycodone and ibuprofen without relief. - Scheduled to have MRI of lumbar spine which was canceled secondary to patient preference and improvement of symptoms while on gabapentin. -Spoke with Dr. Rogue Bussing who recommends imaging of right hip and leg to rule out acute abnormality and adequate pain control. -We will get x-ray of right hip and femur.  Results are negative for acute fracture or abnormality. -Recommend gabapentin and oxycodone for pain.  We can try baclofen to see if this is helpful. (Zanaflex is not covered by his insurance).  If no improvement, Dr. Rogue Bussing would recommend MRI.   Disposition:  - X-ray of right hip and femur ASAP. -Patient to start baclofen 3 times daily as needed for muscle spasms.  -Keep CT scan scheduled for 07/05/2020.   Addendum: -Called and left voicemail for patient to review results from x-rays. Will go ahead and get MRI of lumbar and thoracic spine scheduled.    Visit Diagnosis 1. Acute right-sided low back pain with right-sided sciatica     Patient expressed understanding and was in  agreement with this plan. He also understands that He can call clinic at any time with any questions, concerns, or complaints.   Greater than 50% was spent in counseling and coordination of care with this patient including but not limited to discussion of the relevant topics above (See A&P) including, but not limited to diagnosis and management of acute and chronic medical conditions.   Thank you for allowing me to  participate in the care of this very pleasant patient.    Jacquelin Hawking, NP [place] [facility] Cell - [phone] Pager- [phone] 07/04/2020 12:26 PM

## 2020-07-03 NOTE — Telephone Encounter (Signed)
Patient called reporting that he is having worsening pain in his right leg/hip for past few days and nothing he is taking is helping it. He has tried Oxycodone, BC powders, Aleve, ibuprofen and what ever else he could try and he is getting no relief. He has been up at night past 2 nights with the pain per wife. Please advise

## 2020-07-05 ENCOUNTER — Ambulatory Visit
Admission: RE | Admit: 2020-07-05 | Discharge: 2020-07-05 | Disposition: A | Discharge status: Home / Self Care | Payer: Medicare Other | Source: Ambulatory Visit | Attending: Internal Medicine | Admitting: Internal Medicine

## 2020-07-05 ENCOUNTER — Other Ambulatory Visit: Payer: Self-pay

## 2020-07-05 DIAGNOSIS — C3411 Malignant neoplasm of upper lobe, right bronchus or lung: Secondary | ICD-10-CM | POA: Diagnosis not present

## 2020-07-05 MED ORDER — IOHEXOL 300 MG/ML  SOLN
75.0000 mL | Freq: Once | INTRAMUSCULAR | Status: AC | PRN
  Administered 2020-07-05: 75 mL via INTRAVENOUS

## 2020-07-09 ENCOUNTER — Inpatient Hospital Stay: Payer: Medicare Other

## 2020-07-09 ENCOUNTER — Emergency Department: Payer: Medicare Other

## 2020-07-09 ENCOUNTER — Inpatient Hospital Stay
Admission: EM | Admit: 2020-07-09 | Discharge: 2020-07-12 | DRG: 643 | Disposition: A | Discharge status: Home / Self Care | Payer: Medicare Other | Attending: Obstetrics and Gynecology | Admitting: Obstetrics and Gynecology

## 2020-07-09 ENCOUNTER — Inpatient Hospital Stay (HOSPITAL_BASED_OUTPATIENT_CLINIC_OR_DEPARTMENT_OTHER): Payer: Medicare Other | Admitting: Internal Medicine

## 2020-07-09 ENCOUNTER — Encounter: Payer: Self-pay | Admitting: Internal Medicine

## 2020-07-09 ENCOUNTER — Other Ambulatory Visit: Payer: Self-pay

## 2020-07-09 DIAGNOSIS — R410 Disorientation, unspecified: Secondary | ICD-10-CM

## 2020-07-09 DIAGNOSIS — M1990 Unspecified osteoarthritis, unspecified site: Secondary | ICD-10-CM | POA: Diagnosis present

## 2020-07-09 DIAGNOSIS — R339 Retention of urine, unspecified: Secondary | ICD-10-CM | POA: Diagnosis not present

## 2020-07-09 DIAGNOSIS — G9341 Metabolic encephalopathy: Secondary | ICD-10-CM | POA: Diagnosis present

## 2020-07-09 DIAGNOSIS — F32A Depression, unspecified: Secondary | ICD-10-CM | POA: Diagnosis present

## 2020-07-09 DIAGNOSIS — C7931 Secondary malignant neoplasm of brain: Secondary | ICD-10-CM | POA: Diagnosis present

## 2020-07-09 DIAGNOSIS — Z7984 Long term (current) use of oral hypoglycemic drugs: Secondary | ICD-10-CM

## 2020-07-09 DIAGNOSIS — Z800 Family history of malignant neoplasm of digestive organs: Secondary | ICD-10-CM | POA: Diagnosis not present

## 2020-07-09 DIAGNOSIS — Z85118 Personal history of other malignant neoplasm of bronchus and lung: Secondary | ICD-10-CM

## 2020-07-09 DIAGNOSIS — I10 Essential (primary) hypertension: Secondary | ICD-10-CM | POA: Diagnosis present

## 2020-07-09 DIAGNOSIS — N138 Other obstructive and reflux uropathy: Secondary | ICD-10-CM | POA: Diagnosis present

## 2020-07-09 DIAGNOSIS — C7951 Secondary malignant neoplasm of bone: Secondary | ICD-10-CM | POA: Diagnosis present

## 2020-07-09 DIAGNOSIS — Z20822 Contact with and (suspected) exposure to covid-19: Secondary | ICD-10-CM | POA: Diagnosis present

## 2020-07-09 DIAGNOSIS — J910 Malignant pleural effusion: Secondary | ICD-10-CM | POA: Diagnosis present

## 2020-07-09 DIAGNOSIS — C3411 Malignant neoplasm of upper lobe, right bronchus or lung: Secondary | ICD-10-CM | POA: Diagnosis not present

## 2020-07-09 DIAGNOSIS — E1169 Type 2 diabetes mellitus with other specified complication: Secondary | ICD-10-CM | POA: Diagnosis not present

## 2020-07-09 DIAGNOSIS — Z515 Encounter for palliative care: Secondary | ICD-10-CM

## 2020-07-09 DIAGNOSIS — E1149 Type 2 diabetes mellitus with other diabetic neurological complication: Secondary | ICD-10-CM | POA: Diagnosis present

## 2020-07-09 DIAGNOSIS — J90 Pleural effusion, not elsewhere classified: Secondary | ICD-10-CM

## 2020-07-09 DIAGNOSIS — C782 Secondary malignant neoplasm of pleura: Secondary | ICD-10-CM | POA: Diagnosis present

## 2020-07-09 DIAGNOSIS — Z79899 Other long term (current) drug therapy: Secondary | ICD-10-CM

## 2020-07-09 DIAGNOSIS — Z888 Allergy status to other drugs, medicaments and biological substances status: Secondary | ICD-10-CM

## 2020-07-09 DIAGNOSIS — Z87891 Personal history of nicotine dependence: Secondary | ICD-10-CM

## 2020-07-09 DIAGNOSIS — M5136 Other intervertebral disc degeneration, lumbar region: Secondary | ICD-10-CM | POA: Diagnosis present

## 2020-07-09 DIAGNOSIS — K5900 Constipation, unspecified: Secondary | ICD-10-CM | POA: Diagnosis not present

## 2020-07-09 DIAGNOSIS — Z7951 Long term (current) use of inhaled steroids: Secondary | ICD-10-CM

## 2020-07-09 DIAGNOSIS — E785 Hyperlipidemia, unspecified: Secondary | ICD-10-CM | POA: Diagnosis present

## 2020-07-09 DIAGNOSIS — E222 Syndrome of inappropriate secretion of antidiuretic hormone: Secondary | ICD-10-CM | POA: Diagnosis present

## 2020-07-09 DIAGNOSIS — N401 Enlarged prostate with lower urinary tract symptoms: Secondary | ICD-10-CM | POA: Diagnosis present

## 2020-07-09 DIAGNOSIS — E871 Hypo-osmolality and hyponatremia: Secondary | ICD-10-CM | POA: Diagnosis not present

## 2020-07-09 DIAGNOSIS — Z85820 Personal history of malignant melanoma of skin: Secondary | ICD-10-CM | POA: Diagnosis not present

## 2020-07-09 DIAGNOSIS — Z8051 Family history of malignant neoplasm of kidney: Secondary | ICD-10-CM

## 2020-07-09 DIAGNOSIS — Z7982 Long term (current) use of aspirin: Secondary | ICD-10-CM | POA: Diagnosis not present

## 2020-07-09 DIAGNOSIS — C3490 Malignant neoplasm of unspecified part of unspecified bronchus or lung: Secondary | ICD-10-CM

## 2020-07-09 LAB — BASIC METABOLIC PANEL
Anion gap: 10 (ref 5–15)
Anion gap: 8 (ref 5–15)
BUN: 16 mg/dL (ref 8–23)
BUN: 13 mg/dL (ref 8–23)
CO2: 23 mmol/L (ref 22–32)
CO2: 25 mmol/L (ref 22–32)
Calcium: 8.9 mg/dL (ref 8.9–10.3)
Calcium: 8.7 mg/dL — ABNORMAL LOW (ref 8.9–10.3)
Chloride: 82 mmol/L — ABNORMAL LOW (ref 98–111)
Chloride: 85 mmol/L — ABNORMAL LOW (ref 98–111)
Creatinine, Ser: 0.55 mg/dL — ABNORMAL LOW (ref 0.61–1.24)
Creatinine, Ser: 0.53 mg/dL — ABNORMAL LOW (ref 0.61–1.24)
GFR, Estimated: >60 mL/min (ref >60)
GFR, Estimated: >60 mL/min (ref >60)
Glucose, Bld: 154 mg/dL — ABNORMAL HIGH (ref 70–99)
Glucose, Bld: 139 mg/dL — ABNORMAL HIGH (ref 70–99)
Potassium: 4.9 mmol/L (ref 3.5–5.1)
Potassium: 4.4 mmol/L (ref 3.5–5.1)
Sodium: 115 mmol/L — CL (ref 135–145)
Sodium: 118 mmol/L — CL (ref 135–145)

## 2020-07-09 LAB — COMPREHENSIVE METABOLIC PANEL
ALT: 18 U/L (ref 0–44)
AST: 48 U/L — ABNORMAL HIGH (ref 15–41)
Albumin: 3.4 g/dL — ABNORMAL LOW (ref 3.5–5.0)
Alkaline Phosphatase: 249 U/L — ABNORMAL HIGH (ref 38–126)
Anion gap: 12 (ref 5–15)
BUN: 18 mg/dL (ref 8–23)
CO2: 23 mmol/L (ref 22–32)
Calcium: 9 mg/dL (ref 8.9–10.3)
Chloride: 84 mmol/L — ABNORMAL LOW (ref 98–111)
Creatinine, Ser: 0.62 mg/dL (ref 0.61–1.24)
GFR, Estimated: >60 mL/min (ref >60)
Glucose, Bld: 174 mg/dL — ABNORMAL HIGH (ref 70–99)
Potassium: 4.6 mmol/L (ref 3.5–5.1)
Sodium: 119 mmol/L — CL (ref 135–145)
Total Bilirubin: 0.8 mg/dL (ref 0.3–1.2)
Total Protein: 7 g/dL (ref 6.5–8.1)

## 2020-07-09 LAB — CBC WITH DIFFERENTIAL/PLATELET
Abs Immature Granulocytes: 0.11 10*3/uL — ABNORMAL HIGH (ref 0.00–0.07)
Basophils Absolute: 0 10*3/uL (ref 0.0–0.1)
Basophils Relative: 0 %
Eosinophils Absolute: 0 10*3/uL (ref 0.0–0.5)
Eosinophils Relative: 0 %
HCT: 35.8 % — ABNORMAL LOW (ref 39.0–52.0)
Hemoglobin: 12.5 g/dL — ABNORMAL LOW (ref 13.0–17.0)
Immature Granulocytes: 2 %
Lymphocytes Relative: 15 %
Lymphs Abs: 1.1 10*3/uL (ref 0.7–4.0)
MCH: 30.8 pg (ref 26.0–34.0)
MCHC: 34.9 g/dL (ref 30.0–36.0)
MCV: 88.2 fL (ref 80.0–100.0)
Monocytes Absolute: 0.9 10*3/uL (ref 0.1–1.0)
Monocytes Relative: 13 %
Neutro Abs: 5.1 10*3/uL (ref 1.7–7.7)
Neutrophils Relative %: 70 %
Platelets: 229 10*3/uL (ref 150–400)
RBC: 4.06 MIL/uL — ABNORMAL LOW (ref 4.22–5.81)
RDW: 13.7 % (ref 11.5–15.5)
WBC: 7.3 10*3/uL (ref 4.0–10.5)
nRBC: 0 % (ref 0.0–0.2)

## 2020-07-09 LAB — RESP PANEL BY RT-PCR (FLU A&B, COVID) ARPGX2
Influenza A by PCR: NEGATIVE
Influenza B by PCR: NEGATIVE
SARS Coronavirus 2 by RT PCR: NEGATIVE

## 2020-07-09 LAB — SODIUM
Sodium: 115 mmol/L — CL (ref 135–145)
Sodium: 117 mmol/L — CL (ref 135–145)
Sodium: 118 mmol/L — CL (ref 135–145)

## 2020-07-09 LAB — MRSA PCR SCREENING: MRSA by PCR: NEGATIVE

## 2020-07-09 LAB — GLUCOSE, CAPILLARY: Glucose-Capillary: 188 mg/dL — ABNORMAL HIGH (ref 70–99)

## 2020-07-09 LAB — OSMOLALITY: Osmolality: 253 mOsm/kg — ABNORMAL LOW (ref 275–295)

## 2020-07-09 LAB — SODIUM, URINE, RANDOM: Sodium, Ur: 71 mmol/L

## 2020-07-09 LAB — OSMOLALITY, URINE: Osmolality, Ur: 750 mOsm/kg (ref 300–900)

## 2020-07-09 MED ORDER — SODIUM CHLORIDE 3 % IV SOLN
INTRAVENOUS | Status: DC
  Filled 2020-07-09 (×7): qty 500

## 2020-07-09 MED ORDER — SODIUM CHLORIDE 0.9 % IV SOLN: INTRAVENOUS | Status: DC

## 2020-07-09 MED ORDER — ALBUTEROL SULFATE HFA 108 (90 BASE) MCG/ACT IN AERS
2.0000 | INHALATION_SPRAY | Freq: Four times a day (QID) | RESPIRATORY_TRACT | Status: DC | PRN
  Filled 2020-07-09: qty 6.7

## 2020-07-09 MED ORDER — PRAVASTATIN SODIUM 40 MG PO TABS
40.0000 mg | ORAL_TABLET | Freq: Every day | ORAL | Status: DC
  Administered 2020-07-09 – 2020-07-11 (×3): 40 mg via ORAL
  Filled 2020-07-09: qty 2
  Filled 2020-07-09: qty 1
  Filled 2020-07-09: qty 2

## 2020-07-09 MED ORDER — GABAPENTIN 300 MG PO CAPS
300.0000 mg | ORAL_CAPSULE | Freq: Three times a day (TID) | ORAL | Status: DC
  Administered 2020-07-09 – 2020-07-12 (×8): 300 mg via ORAL
  Filled 2020-07-09 (×8): qty 1

## 2020-07-09 MED ORDER — TAMSULOSIN HCL 0.4 MG PO CAPS
0.4000 mg | ORAL_CAPSULE | Freq: Every day | ORAL | Status: DC
  Administered 2020-07-10 – 2020-07-12 (×3): 0.4 mg via ORAL
  Filled 2020-07-09 (×3): qty 1

## 2020-07-09 MED ORDER — MOMETASONE FURO-FORMOTEROL FUM 200-5 MCG/ACT IN AERO
2.0000 | INHALATION_SPRAY | Freq: Two times a day (BID) | RESPIRATORY_TRACT | Status: DC
  Administered 2020-07-09 – 2020-07-12 (×7): 2 via RESPIRATORY_TRACT
  Filled 2020-07-09: qty 8.8

## 2020-07-09 MED ORDER — CITALOPRAM HYDROBROMIDE 20 MG PO TABS
20.0000 mg | ORAL_TABLET | Freq: Every day | ORAL | Status: DC
  Administered 2020-07-10 – 2020-07-12 (×3): 20 mg via ORAL
  Filled 2020-07-09 (×3): qty 1

## 2020-07-09 MED ORDER — BACLOFEN 10 MG PO TABS
20.0000 mg | ORAL_TABLET | Freq: Three times a day (TID) | ORAL | Status: DC
  Administered 2020-07-09 – 2020-07-11 (×7): 20 mg via ORAL
  Filled 2020-07-09 (×11): qty 2

## 2020-07-09 MED ORDER — ACETAMINOPHEN 325 MG PO TABS: 650.0000 mg | ORAL_TABLET | Freq: Four times a day (QID) | ORAL | Status: DC | PRN

## 2020-07-09 MED ORDER — SODIUM CHLORIDE 0.9 % IV SOLN: Freq: Once | INTRAVENOUS | Status: DC

## 2020-07-09 MED ORDER — CHLORHEXIDINE GLUCONATE CLOTH 2 % EX PADS
6.0000 | MEDICATED_PAD | Freq: Every day | CUTANEOUS | Status: DC
  Administered 2020-07-09 – 2020-07-10 (×2): 6 via TOPICAL

## 2020-07-09 MED ORDER — SODIUM CHLORIDE 3 % IV SOLN
INTRAVENOUS | Status: DC
  Filled 2020-07-09 (×2): qty 500

## 2020-07-09 MED ORDER — FISH OIL 500 MG PO CAPS: 500.0000 mg | ORAL_CAPSULE | Freq: Two times a day (BID) | ORAL | Status: DC

## 2020-07-09 MED ORDER — ASPIRIN 325 MG PO TABS
325.0000 mg | ORAL_TABLET | Freq: Every day | ORAL | Status: DC
  Administered 2020-07-10 – 2020-07-12 (×3): 325 mg via ORAL
  Filled 2020-07-09 (×3): qty 1

## 2020-07-09 MED ORDER — OXYCODONE HCL 5 MG PO TABS
5.0000 mg | ORAL_TABLET | Freq: Three times a day (TID) | ORAL | Status: DC | PRN
  Administered 2020-07-11: 5 mg via ORAL
  Filled 2020-07-09: qty 1

## 2020-07-09 MED ORDER — ONDANSETRON HCL 4 MG/2ML IJ SOLN: 4.0000 mg | Freq: Three times a day (TID) | INTRAMUSCULAR | Status: DC | PRN

## 2020-07-09 MED ORDER — SODIUM CHLORIDE 0.9 % IV BOLUS
500.0000 mL | Freq: Once | INTRAVENOUS | Status: AC
  Administered 2020-07-09: 500 mL via INTRAVENOUS

## 2020-07-09 MED ORDER — FOLIC ACID 1 MG PO TABS
1.0000 mg | ORAL_TABLET | Freq: Every day | ORAL | Status: DC
  Administered 2020-07-10 – 2020-07-12 (×3): 1 mg via ORAL
  Filled 2020-07-09 (×3): qty 1

## 2020-07-09 MED ORDER — ADULT MULTIVITAMIN W/MINERALS CH
1.0000 | ORAL_TABLET | Freq: Every day | ORAL | Status: DC
  Administered 2020-07-10 – 2020-07-12 (×3): 1 via ORAL
  Filled 2020-07-09 (×3): qty 1

## 2020-07-09 MED ORDER — ENOXAPARIN SODIUM 60 MG/0.6ML ~~LOC~~ SOLN
0.5000 mg/kg | SUBCUTANEOUS | Status: DC
  Administered 2020-07-09 – 2020-07-11 (×3): 52.5 mg via SUBCUTANEOUS
  Filled 2020-07-09 (×5): qty 0.6

## 2020-07-09 MED ORDER — LIDOCAINE-PRILOCAINE 2.5-2.5 % EX CREA
1.0000 "application " | TOPICAL_CREAM | CUTANEOUS | Status: DC | PRN
  Filled 2020-07-09: qty 5

## 2020-07-09 MED ORDER — HYDRALAZINE HCL 20 MG/ML IJ SOLN: 5.0000 mg | INTRAMUSCULAR | Status: DC | PRN

## 2020-07-09 NOTE — ED Notes (Signed)
Resumed care from [employer].  Iv fluids infusing.  Pt sleeping easily arouse.  Family with pt.  Pt waiting on bed assignment.

## 2020-07-09 NOTE — H&P (Addendum)
History and Physical    Fred Anderson JOA:[id number] DOB: 08/16/1945 DOA: 07/09/2020  Referring MD/NP/PA:   PCP: Fred Kayser, MD   Patient coming from:  The patient is coming from home.  At baseline, pt is partially dependent for most of ADL.        Chief Complaint: Weakness, confusion  HPI: Fred Anderson is a 75 y.o. male with medical history significant of NSCLC on chemotherapy, HTN, HLD, pre-DM, depression, BPH, melanoma, kidney stone, who presents with generalized weakness, confusion.  Per his wife, pt is currently doing chemotherapy, last dose of treatment was 3 weeks ago.  He developed generalized weakness in past several days, which has been progressively worsening.  Since this morning, patient has been confused.  He has poor appetite and decreased oral intake.  Patient has nausea, mostly dry heaves, no vomiting, abdominal pain or diarrhea.  Patient denies chest pain, cough, shortness of breath.  No symptoms of UTI.  No fever or chills.  When I saw patient in the emergency room, patient is mildly confused, but is still orientated x3.  He moves all extremities.  No facial droop or slurred speech.  Patient was seen by his oncologist, Dr. Rogue Anderson today, found to have low sodium, and sent to hospital for further evaluation and treatment.   ED Course: pt was found to have sodium 119, WBC 7.3, renal function okay, negative COVID PCR, temperature normal, blood pressure 149/82, heart rate 100, RR 23, oxygen saturation 94% on room air. Pt is admitted to progressive bed as inpatient  Chest x-ray showed: 1. Subpulmonic effusion on the right with some atelectasis of the right lower lung. 2. 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  Review of Systems:   General: no fevers, chills, no body weight gain, has poor appetite, has fatigue HEENT: no blurry vision, hearing changes or sore throat Respiratory: no dyspnea, coughing, wheezing CV: no chest pain, no palpitations GI: no  nausea, vomiting, abdominal pain, diarrhea, constipation GU: no dysuria, burning on urination, increased urinary frequency, hematuria  Ext: no leg edema Neuro: no unilateral weakness, numbness, or tingling, no vision change or hearing loss. Has confusion Skin: no rash, no skin tear. MSK: No muscle spasm, no deformity, no limitation of range of movement in spin Heme: No easy bruising.  Travel history: No recent long distant travel.  Allergy:  Allergies  Allergen Reactions  . Atorvastatin Other (See Comments)    Muscle pain.    Past Medical History:  Diagnosis Date  . Arthritis   . Blood in semen   . BPH (benign prostatic hypertrophy)   . Cancer ([place])    melanoma on back  . DDD (degenerative disc disease), lumbar   . Diabetes mellitus without complication ([place])    TYPE 2  . Erectile dysfunction   . Hematospermia   . Hemorrhoids   . History of adenomatous polyp of colon   . History of kidney stones   . Hyperlipidemia   . Hypertension   . Kidney stones   . Obesity   . 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Fred Anderson)   . Prediabetes     Past Surgical History:  Procedure Laterality Date  . BACK SURGERY  age 78   Broken back  . COLONOSCOPY WITH PROPOFOL N/A 05/05/2017   Procedure: COLONOSCOPY WITH PROPOFOL;  Surgeon: Fred Anderson, Fred Pike, MD;  Location: [facility] ENDOSCOPY;  Service: Gastroenterology;  Laterality: N/A;  . HEMORRHOID SURGERY    . IR IMAGING GUIDED PORT INSERTION  05/17/2020  .  LAPAROSCOPIC APPENDECTOMY N/A 04/25/2018   Procedure: APPENDECTOMY LAPAROSCOPIC;  Surgeon: Fred Pun, MD;  Location: [facility] ORS;  Service: General;  Laterality: N/A;  . SHOULDER ARTHROSCOPY WITH OPEN ROTATOR CUFF REPAIR Right 04/03/2019   Procedure: SHOULDER ARTHROSCOPY WITH SUBSACAPULARIS REPAIR, OPEN ROTATOR CUFF REPAIR, SUBARCOMIAL DECOMPRESSION, BICEPS TENODESIS;  Surgeon: Fred Fabry, MD;  Location: [facility] ORS;  Service: Orthopedics;  Laterality: Right;  . SMALL INTESTINE  SURGERY      Social History:  reports that he quit smoking about 31 years ago. He has never used smokeless tobacco. He reports current alcohol use of about 12.0 standard drinks of alcohol per week. He reports that he does not use drugs.  Family History:  Family History  Problem Relation Age of Onset  . Colon cancer Father   . Cancer Mother   . Liver cancer Sister   . Kidney cancer Brother   . Bone cancer Brother   . Kidney disease Neg Hx   . Prostate cancer Neg Hx   . Bladder Cancer Neg Hx      Prior to Admission medications   Medication Sig Start Date End Date Taking? Authorizing Provider  acetaminophen (TYLENOL) 500 MG tablet Take 1,000 mg by mouth every 6 (six) hours as needed.    [provider]  albuterol (VENTOLIN HFA) 108 (90 Base) MCG/ACT inhaler Inhale 2 puffs into the lungs every 6 (six) hours as needed for wheezing or shortness of breath. Patient not taking: Reported on 07/09/2020 05/23/20   Cammie Sickle, MD  aspirin 325 MG tablet Take 325 mg by mouth daily.    [provider]  baclofen (LIORESAL) 20 MG tablet Take 1 tablet (20 mg total) by mouth 3 (three) times daily. 07/03/20   Jacquelin Hawking, NP  budesonide-formoterol (SYMBICORT) 160-4.5 MCG/ACT inhaler Inhale 2 puffs into the lungs in the morning and at bedtime. Patient not taking: Reported on 07/09/2020 06/03/20   Cammie Sickle, MD  chlorpheniramine-HYDROcodone (TUSSIONEX) 10-8 MG/5ML SUER Take 5 mLs by mouth at bedtime as needed for cough. Patient not taking: Reported on 07/09/2020 05/23/20   Cammie Sickle, MD  citalopram (CELEXA) 20 MG tablet Take 1 tablet (20 mg total) by mouth daily. 06/25/20   Cammie Sickle, MD  dexamethasone (DECADRON) 4 MG tablet Take one pill AM & PM x 2 days; one day before and one day after chemo. None on the day of chemo. 05/13/20   Cammie Sickle, MD  folic acid (FOLVITE) 1 MG tablet Take 1 tablet (1 mg total) by mouth daily. 05/13/20    Cammie Sickle, MD  gabapentin (NEURONTIN) 300 MG capsule Start take 1 pill at night x3 days; then one pill twice a day. 06/18/20   Cammie Sickle, MD  lidocaine-prilocaine (EMLA) cream Apply 1 application topically as needed. 05/13/20   Cammie Sickle, MD  lovastatin (MEVACOR) 40 MG tablet Take 40 mg by mouth at bedtime.  10/03/17   [provider]  metFORMIN (GLUCOPHAGE) 500 MG tablet Take 1,000 mg by mouth 2 (two) times daily. 03/02/19   [provider]  Multiple Vitamin (MULTIVITAMIN WITH MINERALS) TABS tablet Take 1 tablet by mouth daily.    [provider]  Omega-3 Fatty Acids (FISH OIL) 500 MG CAPS Take 500 mg by mouth 2 (two) times daily.     [provider]  ondansetron (ZOFRAN) 8 MG tablet One pill every 8 hours as needed for nausea/vomitting. 05/13/20   Cammie Sickle, MD  oxyCODONE (  OXY IR/ROXICODONE) 5 MG immediate release tablet Take 1 tablet (5 mg total) by mouth every 8 (eight) hours as needed for severe pain. 06/11/20   Cammie Sickle, MD  prochlorperazine (COMPAZINE) 10 MG tablet Take 1 tablet (10 mg total) by mouth every 6 (six) hours as needed for nausea or vomiting. 05/13/20   Cammie Sickle, MD  tamsulosin (FLOMAX) 0.4 MG CAPS capsule Take 1 capsule (0.4 mg total) by mouth daily. 12/08/19   Nori Riis, PA-C    Physical Exam: Vitals:   07/09/20 1130 07/09/20 1145 07/09/20 1200 07/09/20 1230  BP: (!) 149/82 (!) 149/82 140/71 137/60  Pulse: 84 100 79 85  Resp:  (!) 23 17 13   Temp:      TempSrc:      SpO2:  94%    Weight:      Height:       General: Not in acute distress.  HEENT:       Eyes: PERRL, EOMI, no scleral icterus.       ENT: No discharge from the ears and nose, no pharynx injection, no tonsillar enlargement.        Neck: No JVD, no bruit, no mass felt. Heme: No neck lymph node enlargement. Cardiac: S1/S2, RRR, No murmurs, No gallops or rubs. Respiratory: No rales, wheezing,  rhonchi or rubs. GI: Soft, nondistended, nontender, no rebound pain, no organomegaly, BS present. GU: No hematuria Ext: No pitting leg edema bilaterally. 1+DP/PT pulse bilaterally. Musculoskeletal: No joint deformities, No joint redness or warmth, no limitation of ROM in spin. Skin: No rashes.  Neuro: mildly confused, but pt is still oriented X3, cranial nerves II-XII grossly intact, moves all extremities. Psych: Patient is not psychotic, no suicidal or hemocidal ideation.  Labs on Admission: I have personally reviewed following labs and imaging studies  CBC: Recent Labs  Lab 07/09/20 0821  WBC 7.3  NEUTROABS 5.1  HGB 12.5*  HCT 35.8*  MCV 88.2  PLT 903   Basic Metabolic Panel: Recent Labs  Lab 07/09/20 0821 07/09/20 1150  NA 119* 115*  K 4.6 4.9  CL 84* 82*  CO2 23 23  GLUCOSE 174* 154*  BUN 18 16  CREATININE 0.62 0.55*  CALCIUM 9.0 8.9   GFR: Estimated Creatinine Clearance: 100.3 mL/min (A) (by C-G formula based on SCr of 0.55 mg/dL (L)). Liver Function Tests: Recent Labs  Lab 07/09/20 0821  AST 48*  ALT 18  ALKPHOS 249*  BILITOT 0.8  PROT 7.0  ALBUMIN 3.4*   No results for input(s): LIPASE, AMYLASE in the last 168 hours. No results for input(s): AMMONIA in the last 168 hours. Coagulation Profile: No results for input(s): INR, PROTIME in the last 168 hours. Cardiac Enzymes: No results for input(s): CKTOTAL, CKMB, CKMBINDEX, TROPONINI in the last 168 hours. BNP (last 3 results) No results for input(s): PROBNP in the last 8760 hours. HbA1C: No results for input(s): HGBA1C in the last 72 hours. CBG: No results for input(s): GLUCAP in the last 168 hours. Lipid Profile: No results for input(s): CHOL, HDL, LDLCALC, TRIG, CHOLHDL, LDLDIRECT in the last 72 hours. Thyroid Function Tests: No results for input(s): TSH, T4TOTAL, FREET4, T3FREE, THYROIDAB in the last 72 hours. Anemia Panel: No results for input(s): VITAMINB12, FOLATE, FERRITIN, TIBC, IRON,  RETICCTPCT in the last 72 hours. Urine analysis:    Component Value Date/Time   COLORURINE YELLOW (A) 05/13/2020 1450   APPEARANCEUR CLEAR (A) 05/13/2020 1450   LABSPEC 1.027 05/13/2020 1450   PHURINE  5.0 05/13/2020 1450   GLUCOSEU NEGATIVE 05/13/2020 1450   HGBUR NEGATIVE 05/13/2020 1450   BILIRUBINUR NEGATIVE 05/13/2020 1450   KETONESUR NEGATIVE 05/13/2020 1450   PROTEINUR NEGATIVE 05/13/2020 1450   NITRITE NEGATIVE 05/13/2020 1450   LEUKOCYTESUR NEGATIVE 05/13/2020 1450   Sepsis Labs: @LABRCNTIP (procalcitonin:4,lacticidven:4) ) Recent Results (from the past 240 hour(s))  Resp Panel by RT-PCR (Flu A&B, Covid) Nasopharyngeal Swab     Status: None   Collection Time: 07/09/20 11:03 AM   Specimen: Nasopharyngeal Swab; Nasopharyngeal(NP) swabs in vial transport medium  Result Value Ref Range Status   SARS Coronavirus 2 by RT PCR NEGATIVE NEGATIVE Final    Comment: (NOTE) SARS-CoV-2 target nucleic acids are NOT DETECTED.  The SARS-CoV-2 RNA is generally detectable in upper respiratory specimens during the acute phase of infection. The lowest concentration of SARS-CoV-2 viral copies this assay can detect is 138 copies/mL. A negative result does not preclude SARS-Cov-2 infection and should not be used as the sole basis for treatment or other patient management decisions. A negative result may occur with  improper specimen collection/handling, submission of specimen other than nasopharyngeal swab, presence of viral mutation(s) within the areas targeted by this assay, and inadequate number of viral copies(<138 copies/mL). A negative result must be combined with clinical observations, patient history, and epidemiological information. The expected result is Negative.  Fact Sheet for Patients:  EntrepreneurPulse.com.au  Fact Sheet for Healthcare Providers:  IncredibleEmployment.be  This test is no t yet approved or cleared by the [place]  FDA and  has been authorized for detection and/or diagnosis of SARS-CoV-2 by FDA under an Emergency Use Authorization (EUA). This EUA will remain  in effect (meaning this test can be used) for the duration of the COVID-19 declaration under Section 564(b)(1) of the Act, 21 U.S.C.section 360bbb-3(b)(1), unless the authorization is terminated  or revoked sooner.       Influenza A by PCR NEGATIVE NEGATIVE Final   Influenza B by PCR NEGATIVE NEGATIVE Final    Comment: (NOTE) The Xpert Xpress SARS-CoV-2/FLU/RSV plus assay is intended as an aid in the diagnosis of influenza from Nasopharyngeal swab specimens and should not be used as a sole basis for treatment. Nasal washings and aspirates are unacceptable for Xpert Xpress SARS-CoV-2/FLU/RSV testing.  Fact Sheet for Patients: EntrepreneurPulse.com.au  Fact Sheet for Healthcare Providers: IncredibleEmployment.be  This test is not yet approved or cleared by the [place] FDA and has been authorized for detection and/or diagnosis of SARS-CoV-2 by FDA under an Emergency Use Authorization (EUA). This EUA will remain in effect (meaning this test can be used) for the duration of the COVID-19 declaration under Section 564(b)(1) of the Act, 21 U.S.C. section 360bbb-3(b)(1), unless the authorization is terminated or revoked.  Performed at [facility], [address] [place], [place] [address]      Radiological Exams on Admission: DG Chest Portable 1 View  Result Date: 07/09/2020 CLINICAL DATA:  Chemotherapy for lung cancer. EXAM: PORTABLE CHEST 1 VIEW COMPARISON:  05/06/2020 FINDINGS: Power port on the right has its tip at the SVC RA junction. The left chest is clear. There is a sub pulmonic effusion on the right and a small amount of fluid in the fissure. Volume loss in the right lower lung. 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 IMPRESSION: 1. Subpulmonic effusion on the right  with some atelectasis of the right lower lung. 2. 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 Electronically Signed   By: Nelson Chimes M.D.   On: 07/09/2020  11:31     EKG:  Not done in ED, will get one.   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  Patient has mild confusion, but is still orientated x3.  ED physician discussed with PCCM, they recommend that patient does not need to be in ICU.  Consulted Dr. Candiss Norse of renal, who recommended to treat the patient with either tolvaptan or hypertonic saline.  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  Currently on chemotherapy, last dose was 3 weeks ago. -Consulted Dr. Lynett Fish of oncology  Type 2 diabetes mellitus with neurologic complication, without long-term current use of insulin [facility]): Patient is taking metformin.  Blood sugar 74 -Sliding scale insulin  HTN (hypertension) -IV hydralazine as needed  Depression -Celexa  Acute metabolic encephalopathy: Likely due to hyponatremia. -Frequent neuro check -Follow-up CT head     DVT ppx: SQ Lovenox Code Status: partial code per his wife and pt (I  discussed with the patient in the presence of wife, and explained the meaning of CODE STATUS, patient wants to be partial code, OK for CPR, but no intubation). Family Communication:  Yes, patient's wife at bed side Disposition Plan:  Anticipate discharge back to previous environment Consults called:  Dr. Candiss Norse of renal and Dr. Rogue Anderson of oncology Admission status and Level of care: Progressive Cardiac:   as inpt     Status is: Inpatient  Remains inpatient appropriate because:Inpatient level of care appropriate due to severity of illness   Dispo: The patient is from: Home              Anticipated d/c is to: Home              Patient currently is not medically stable to d/c.   Difficult to place patient No          Date of Service 07/09/2020    [facility]   If 7PM-7AM, please contact night-coverage [email or web address] 07/09/2020, 1:11 PM

## 2020-07-09 NOTE — ED Notes (Signed)
Report  Called to Judson Roch RN CCU nurse

## 2020-07-09 NOTE — Progress Notes (Signed)
PHARMACIST - PHYSICIAN COMMUNICATION  CONCERNING:  Enoxaparin (Lovenox) for DVT Prophylaxis    RECOMMENDATION: Patient was prescribed enoxaparin 40mg  q24 hours for VTE prophylaxis.   Filed Weights   07/09/20 1018  Weight: 105.7 kg (233 lb)    Body mass index is 32.5 kg/m.  Estimated Creatinine Clearance: 100.3 mL/min (A) (by C-G formula based on SCr of 0.55 mg/dL (L)).   Based on [place] patient is candidate for enoxaparin 0.5mg /kg TBW SQ every 24 hours based on BMI being >30.  DESCRIPTION: Pharmacy has adjusted enoxaparin dose per [facility] policy.  Patient is now receiving enoxaparin 52.5 mg every 24 hours    Benita Gutter 07/09/2020 4:39 PM

## 2020-07-09 NOTE — Progress Notes (Signed)
Complains of no appetite since last tx. Has been dry heaving. Has been taking nausea medication. Sleeping a lot more.

## 2020-07-09 NOTE — ED Notes (Signed)
Iv fluids infusing.  Pt alert  Family with pt.

## 2020-07-09 NOTE — Assessment & Plan Note (Addendum)
#  Right lung adenocarcinoma/stage IV; s/p 2 cycles of carbo Alimta Keytruda; April 25th CT-stable to improved right upper lobe lung masses; mildly increased right-sided pleural effusion.   # HOLD  cycle #3-carbo Alimta Keytruda; Labs today reviewed;  UNacceptable for treatment today- severe hyponatremia- see below  # Severe hyponatremia- 119 [sodium]-factorial-chemotherapy per nutrition- ? SIADH-recommend further work-up.  Recommend urgent evaluation in the emergency room.  # Right chest wall pain/cough- sec to pleural metastases/pleural effusion-worse; recommend thoracentesis while in hospital.  #Back pain/Right hip pain/radiating to right leg with twitching and numbness.?Sciatica-benign versus malignancy.  Recommend MRI thoracic lumbar spine while in the hospital.  # ? Bone metastasis noted on MRI brain-cervical /thoracicspine; PET scan also inconclusive; monitor for now.plan MRI T/L spine-see above.   # Diabetes- HbA1c- 6.7 [Sep 2021]-monitor closely on dexamethasone.  # I spoke at length with the patient's wife/ daughter, Fred Anderson- regarding the patient's clinical status/plan of care.  Daughter understands that patient might need to be admitted to hospital for work-up/treatment of his hyponatremia; and also will plan thoracentesis and MRI of the back.  They are in agreement.  # DISPOSITION: # HOLD chemo # recommend ER- # follow up TBD-Dr.B  # 40 minutes face-to-face with the patient discussing the above plan of care; more than 50% of time spent on prognosis/ natural history; counseling and coordination.  # I reviewed the blood work- with the patient in detail; also reviewed the imaging independently [as summarized above]; and with the patient in detail.

## 2020-07-09 NOTE — Progress Notes (Signed)
[facility] NOTE  Patient Care Team: Ezequiel Kayser, MD as PCP - General (Internal Medicine) Telford Nab, RN as Oncology Nurse Navigator  CHIEF COMPLAINTS/PURPOSE OF CONSULTATION: lung cancer  Oncology History Overview Note  May 07, 2020- CT chest [ER]..Centrally obstructing right upper lobe masses, right upper lobe nodular consolidation, large right pleural effusion and extensive pleural/extrapleural nodularity and mediastinal/right hilar adenopathy, findings most indicative of stage IV primary bronchogenic carcinoma.s/p RIGHT Thoracentesis- MALIGNANT CELLS PRESENT.  - METASTATIC NON SMALL CELL CARCINOMA, FAVOR ADENOCARCINOMA. FEB- 2022- MRI Brain NEG for parenchymal metastases; cervical bone met. PET-MARCH 2022-pleural-based metastases; pleural effusion question bone mets.    # MARCH 10th, 2022- [place]-Alimat [awaiting NGS]; # April 4th, 2022 cycles- carbo-alimta-keytruda  # Enlarged pulmonary arteries, indicative of pulmonary arterial Hypertension.  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; [place]]  # NGS/MOLECULAR TESTS:Guardant testing- ATM *; No other targetable mutation    # PALLIATIVE CARE EVALUATION:  # PAIN MANAGEMENT:    DIAGNOSIS: Lung cancer  STAGE:  IV       ;  GOALS: palliative  CURRENT/MOST RECENT THERAPY : carbo-alimta-keytruda    Cancer of upper lobe of right lung ([place])  05/13/2020 Initial Diagnosis   Cancer of upper lobe of right lung ([place])   05/13/2020 Cancer Staging   Staging form: Lung, AJCC 8th Edition - Clinical: Stage IVA (cT2, cN3, pM1b) - Signed by Cammie Sickle, MD on 05/13/2020 Histopathologic type: Adenocarcinoma, NOS   05/23/2020 -  Chemotherapy    Patient is on Treatment Plan: LUNG CARBOPLATIN / PEMETREXED / PEMBROLIZUMAB Q21D INDUCTION X 4 CYCLES / MAINTENANCE PEMETREXED + PEMBROLIZUMAB         HISTORY OF PRESENTING ILLNESS:  Fred Anderson Standard 75 y.o.  male history lung cancer -adenocarcinoma stage IV  -currently on [place] Alimta-keytruda chemotherapy s/p cycle #2 is here for follow-up/review results of the restaging CT scan   In the interim patient was evaluated in the management clinic for worsening right-sided hip pain radiating to his leg.  MRI thoracic/lumbar spine pending/not scheduled yet.  Patient complains of extreme fatigue.  Drowsiness.  Difficulty sleeping at night because of right hip pain radiating to the leg.  Complains of shortness of breath especially on exertion.  Poor appetite.  Weight loss.  Review of Systems  Constitutional: Positive for malaise/fatigue and weight loss. Negative for chills, diaphoresis and fever.  HENT: Negative for nosebleeds and sore throat.   Eyes: Negative for double vision.  Respiratory: Positive for cough and sputum production. Negative for hemoptysis, shortness of breath and wheezing.   Cardiovascular: Positive for chest pain. Negative for palpitations, orthopnea and leg swelling.  Gastrointestinal: Negative for abdominal pain, blood in stool, constipation, heartburn, melena, nausea and vomiting.  Genitourinary: Negative for dysuria, frequency and urgency.  Musculoskeletal: Positive for back pain and joint pain.  Skin: Negative.  Negative for itching and rash.  Neurological: Negative for dizziness, tingling, focal weakness and weakness.  Endo/Heme/Allergies: Does not bruise/bleed easily.  Psychiatric/Behavioral: Negative for depression. The patient is not nervous/anxious and does not have insomnia.      MEDICAL HISTORY:  Past Medical History:  Diagnosis Date  . Arthritis   . Blood in semen   . BPH (benign prostatic hypertrophy)   . Cancer ([place])    melanoma on back  . DDD (degenerative disc disease), lumbar   . Diabetes mellitus without complication ([place])    TYPE 2  . Erectile dysfunction   . Hematospermia   . Hemorrhoids   .  History of adenomatous polyp of colon   . History of kidney stones   . Hyperlipidemia   . Hypertension   .  Kidney stones   . Obesity   . 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[place])   . Prediabetes     SURGICAL HISTORY: Past Surgical History:  Procedure Laterality Date  . BACK SURGERY  age 55   Broken back  . COLONOSCOPY WITH PROPOFOL N/A 05/05/2017   Procedure: COLONOSCOPY WITH PROPOFOL;  Surgeon: Toledo, Benay Pike, MD;  Location: [facility] ENDOSCOPY;  Service: Gastroenterology;  Laterality: N/A;  . HEMORRHOID SURGERY    . IR IMAGING GUIDED PORT INSERTION  05/17/2020  . LAPAROSCOPIC APPENDECTOMY N/A 04/25/2018   Procedure: APPENDECTOMY LAPAROSCOPIC;  Surgeon: Herbert Pun, MD;  Location: [facility] ORS;  Service: General;  Laterality: N/A;  . SHOULDER ARTHROSCOPY WITH OPEN ROTATOR CUFF REPAIR Right 04/03/2019   Procedure: SHOULDER ARTHROSCOPY WITH SUBSACAPULARIS REPAIR, OPEN ROTATOR CUFF REPAIR, SUBARCOMIAL DECOMPRESSION, BICEPS TENODESIS;  Surgeon: Leim Fabry, MD;  Location: [facility] ORS;  Service: Orthopedics;  Laterality: Right;  . SMALL INTESTINE SURGERY      SOCIAL HISTORY: Social History   Socioeconomic History  . Marital status: Married    Spouse name: Not on file  . Number of children: Not on file  . Years of education: Not on file  . Highest education level: Not on file  Occupational History  . Not on file  Tobacco Use  . Smoking status: Former Smoker    Quit date: 08/15/1988    Years since quitting: 31.9  . Smokeless tobacco: Never Used  . Tobacco comment: quit september 11th 1991  Vaping Use  . Vaping Use: Never used  Substance and Sexual Activity  . Alcohol use: Yes    Alcohol/week: 12.0 standard drinks    Types: 12 Cans of beer per week  . Drug use: No  . Sexual activity: Not on file  Other Topics Concern  . Not on file  Social History Narrative   Quit smoking in 1991; used to smoke 3 ppd. Lives in [place]; with wife; and 2 daughters. 2-3 drink 4/ week. Worked in Tourist information centre manager; retired in 2013; raised cattle.    Social Determinants of Health    Financial Resource Strain: Not on file  Food Insecurity: Not on file  Transportation Needs: Not on file  Physical Activity: Not on file  Stress: Not on file  Social Connections: Not on file  Intimate Partner Violence: Not on file    FAMILY HISTORY: Family History  Problem Relation Age of Onset  . Colon cancer Father   . Cancer Mother   . Liver cancer Sister   . Kidney cancer Brother   . Bone cancer Brother   . Kidney disease Neg Hx   . Prostate cancer Neg Hx   . Bladder Cancer Neg Hx     ALLERGIES:  is allergic to atorvastatin.  MEDICATIONS:  Current Outpatient Medications  Medication Sig Dispense Refill  . acetaminophen (TYLENOL) 500 MG tablet Take 1,000 mg by mouth every 6 (six) hours as needed.    Marland Kitchen aspirin 325 MG tablet Take 325 mg by mouth daily.    . baclofen (LIORESAL) 20 MG tablet Take 1 tablet (20 mg total) by mouth 3 (three) times daily. 30 each 0  . citalopram (CELEXA) 20 MG tablet Take 1 tablet (20 mg total) by mouth daily. 30 tablet 2  . dexamethasone (DECADRON) 4 MG tablet Take one pill AM & PM x 2 days; one day  before and one day after chemo. None on the day of chemo. 60 tablet 0  . folic acid (FOLVITE) 1 MG tablet Take 1 tablet (1 mg total) by mouth daily. 90 tablet 1  . gabapentin (NEURONTIN) 300 MG capsule Start take 1 pill at night x3 days; then one pill twice a day. 60 capsule 0  . lidocaine-prilocaine (EMLA) cream Apply 1 application topically as needed. 30 g 0  . lovastatin (MEVACOR) 40 MG tablet Take 40 mg by mouth at bedtime.   0  . metFORMIN (GLUCOPHAGE) 500 MG tablet Take 1,000 mg by mouth 2 (two) times daily.    . Multiple Vitamin (MULTIVITAMIN WITH MINERALS) TABS tablet Take 1 tablet by mouth daily.    . Omega-3 Fatty Acids (FISH OIL) 500 MG CAPS Take 500 mg by mouth 2 (two) times daily.     . ondansetron (ZOFRAN) 8 MG tablet One pill every 8 hours as needed for nausea/vomitting. 40 tablet 1  . oxyCODONE (OXY IR/ROXICODONE) 5 MG immediate  release tablet Take 1 tablet (5 mg total) by mouth every 8 (eight) hours as needed for severe pain. 30 tablet 0  . prochlorperazine (COMPAZINE) 10 MG tablet Take 1 tablet (10 mg total) by mouth every 6 (six) hours as needed for nausea or vomiting. 40 tablet 1  . tamsulosin (FLOMAX) 0.4 MG CAPS capsule Take 1 capsule (0.4 mg total) by mouth daily. 90 capsule 3  . albuterol (VENTOLIN HFA) 108 (90 Base) MCG/ACT inhaler Inhale 2 puffs into the lungs every 6 (six) hours as needed for wheezing or shortness of breath. (Patient not taking: Reported on 07/09/2020) 1 each 2  . budesonide-formoterol (SYMBICORT) 160-4.5 MCG/ACT inhaler Inhale 2 puffs into the lungs in the morning and at bedtime. (Patient not taking: Reported on 07/09/2020) 1 each 3  . chlorpheniramine-HYDROcodone (TUSSIONEX) 10-8 MG/5ML SUER Take 5 mLs by mouth at bedtime as needed for cough. (Patient not taking: Reported on 07/09/2020) 140 mL 0   No current facility-administered medications for this visit.      Marland Kitchen  PHYSICAL EXAMINATION: ECOG PERFORMANCE STATUS: 1 - Symptomatic but completely ambulatory  Vitals:   07/09/20 0844  BP: 128/72  Pulse: 88  Resp: 18  Temp: 97.9 F (36.6 Anderson)  SpO2: 95%   Filed Weights   07/09/20 0844  Weight: 233 lb (105.7 kg)    Physical Exam Constitutional:      Comments: Ambulating independently.  Accompanied by his wife.  HENT:     Head: Normocephalic and atraumatic.     Mouth/Throat:     Pharynx: No oropharyngeal exudate.  Eyes:     Pupils: Pupils are equal, round, and reactive to light.  Cardiovascular:     Rate and Rhythm: Normal rate and regular rhythm.  Pulmonary:     Effort: No respiratory distress.     Breath sounds: No wheezing.     Comments: Decreased breath on the right side compared to the left.  No wheeze or crackles. Abdominal:     General: Bowel sounds are normal. There is no distension.     Palpations: Abdomen is soft. There is no mass.     Tenderness: There is no abdominal  tenderness. There is no guarding or rebound.  Musculoskeletal:        General: No tenderness. Normal range of motion.     Cervical back: Normal range of motion and neck supple.  Skin:    General: Skin is warm.  Neurological:     Mental Status:  He is alert and oriented to person, place, and time.  Psychiatric:        Mood and Affect: Affect normal.      LABORATORY DATA:  I have reviewed the data as listed Lab Results  Component Value Date   WBC 7.3 07/09/2020   HGB 12.5 (L) 07/09/2020   HCT 35.8 (L) 07/09/2020   MCV 88.2 07/09/2020   PLT 229 07/09/2020   Recent Labs    06/03/20 1241 06/18/20 0849 06/25/20 1441  NA 136 131* 136  K 4.1 4.3 4.5  CL 101 98 102  CO2 _0 GLUCOSE 180* 188* 117*  BUN _1 CREATININE 0.75 0.50* 0.77  CALCIUM 9.1 9.2 8.8*  GFRNONAA >60 >60 >60  PROT 6.6 7.3 7.0  ALBUMIN 3.3* 3.5 3.3*  AST 24 28 45*  ALT 24 16 44  ALKPHOS 89 174* 160*  BILITOT 0.6 0.7 0.5    RADIOGRAPHIC STUDIES: I have personally reviewed the radiological images as listed and agreed with the findings in the report. CT Chest W Contrast  Result Date: 07/06/2020 CLINICAL DATA:  Follow-up metastatic right lung non-small cell carcinoma. Currently undergoing chemotherapy. EXAM: CT CHEST WITH CONTRAST TECHNIQUE: Multidetector CT imaging of the chest was performed during intravenous contrast administration. CONTRAST:  52m OMNIPAQUE IOHEXOL 300 MG/ML  SOLN COMPARISON:  PET-CT on 05/15/2019 FINDINGS: Cardiovascular: No acute findings. Aortic and coronary atherosclerotic calcification noted. Mediastinum/Nodes: Mediastinal lymphadenopathy in right paratracheal region remains stable measuring 2.2 cm short axis. Right hilar lymphadenopathy also shows no significant change measuring 2.5 cm. No new or increased sites of lymphadenopathy identified. Lungs/Pleura: Irregular masslike opacity in the central right upper lobe shows no significant change, measuring 4.4 x 2.7 cm on image  59/2. An irregular pulmonary nodule in the anterior right upper lobe shows mild decrease in size, currently measuring 2.0 x 1.8 cm on image 43/3 compared to 2.3 x 2.2 cm previously. Several sub-cm adjacent sub pleural nodules in the anterior right middle lobe remains stable. Focal opacity in the anterior right upper lobe is unchanged and consistent with postobstructive atelectasis or pneumonitis. Moderate to large right pleural effusion shows mild increase in size since previous study. Diffuse pleural based soft tissue nodularity shows no significant change and indicates a malignant pleural effusion. Upper Abdomen:  Unremarkable. Musculoskeletal:  No suspicious bone lesions. IMPRESSION: Slight decrease in size of anterior right upper lobe pulmonary nodule. Stable masslike opacity in central right upper lobe, and stable right hilar and right paratracheal lymphadenopathy. Stable opacity in medial right upper lobe, likely due to postobstructive atelectasis or pneumonitis. Mild increase in size of malignant right pleural effusion. Aortic Atherosclerosis (ICD10-I70.0). Electronically Signed   By: JMarlaine HindM.D.   On: 07/06/2020 16:51   DG HIP UNILAT WITH PELVIS 2-3 VIEWS RIGHT  Result Date: 07/03/2020 CLINICAL DATA:  Right hip and upper leg pain since 06/28/2020. History of lung cancer. No known injury. EXAM: DG HIP (WITH OR WITHOUT PELVIS) 2-3V RIGHT COMPARISON:  None. FINDINGS: There is no evidence of hip fracture or dislocation. Mild degenerative change about the hips appear symmetric from right to left. Lower lumbar spondylosis is incompletely evaluated. Soft tissues are negative. IMPRESSION: No acute abnormality. Mild appearing bilateral hip osteoarthritis. Lower lumbar degenerative change also noted. Electronically Signed   By: TInge RiseM.D.   On: 07/03/2020 16:41   DG FEMUR, MIN 2 VIEWS RIGHT  Result Date: 07/03/2020 CLINICAL DATA:  Right hip and upper leg pain since 06/28/2020.  History of lung  cancer. No known injury. EXAM: RIGHT FEMUR 2 VIEWS COMPARISON:  None. FINDINGS: There is no evidence of fracture or other focal bone lesions. Soft tissues are unremarkable. IMPRESSION: Negative exam. Electronically Signed   By: Inge Rise M.D.   On: 07/03/2020 16:43    ASSESSMENT & PLAN:   Cancer of upper lobe of right lung ([place]) #Right lung adenocarcinoma/stage IV; s/p 2 cycles of carbo Alimta Keytruda; April 25th CT-stable to improved right upper lobe lung masses; mildly increased right-sided pleural effusion.   # HOLD  cycle #3-carbo Alimta Keytruda; Labs today reviewed;  UNacceptable for treatment today- severe hyponatremia- see below  # Severe hyponatremia- 119 [sodium]-factorial-chemotherapy per nutrition- ? SIADH-recommend further work-up.  Recommend urgent evaluation in the emergency room.  # Right chest wall pain/cough- sec to pleural metastases/pleural effusion-worse; recommend thoracentesis while in hospital.  #Back pain/Right hip pain/radiating to right leg with twitching and numbness.?Sciatica-benign versus malignancy.  Recommend MRI thoracic lumbar spine while in the hospital.  # ? Bone metastasis noted on MRI brain-cervical /thoracicspine; PET scan also inconclusive; monitor for now.plan MRI T/L spine-see above.   # Diabetes- HbA1c- 6.7 [Sep 2021]-monitor closely on dexamethasone.  # I spoke at length with the patient's wife/ daughter, Massie Maroon- regarding the patient's clinical status/plan of care.  Daughter understands that patient might need to be admitted to hospital for work-up/treatment of his hyponatremia; and also will plan thoracentesis and MRI of the back.  They are in agreement.  # DISPOSITION: # HOLD chemo # recommend ER- # follow up TBD-Dr.B  # 40 minutes face-to-face with the patient discussing the above plan of care; more than 50% of time spent on prognosis/ natural history; counseling and coordination.     All questions were answered. The patient knows  to call the clinic with any problems, questions or concerns.    Cammie Sickle, MD 07/09/2020 10:07 AM

## 2020-07-09 NOTE — Consult Note (Signed)
[facility] Consult Note:    Date of Admission:  07/09/2020           Reason for Consult:  hyponatremia   Referring Provider: Ivor Costa, MD Primary Care Provider: Ezequiel Kayser, MD   History of Presenting Illness:  Fred Anderson is a 76 y.o. 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  Currently undergoing chemotherapy with  carbo-alimta-keytruda and is followed by Dr. Rogue Bussing at the cancer center. Could not undergo his chemotherapy today because of feeling weak, decreased oral intake and some dyspnea on exertion.  He does not have any lower extremity edema His wife reports that his appetite has been low but he has been drinking plenty of water   Review of Systems: ROS   Gen: Denies any fevers or chills HEENT: No vision or hearing problems CV: No chest pain but does have shortness of breath with exertion Resp: No cough or sputum production GI: No nausea, vomiting or diarrhea.  No blood in the stool GU : No problems with voiding.  No hematuria.  No previous history of kidney problems MS: Ambulatory.  Denies any acute joint pain or swelling Derm:   No complaints Psych: No complaints Heme: No complaints Neuro: No complaints Endocrine: No complaints   Past Medical History:  Diagnosis Date  . Arthritis   . Blood in semen   . BPH (benign prostatic hypertrophy)   . Cancer ([place])    melanoma on back  . DDD (degenerative disc disease), lumbar   . Diabetes mellitus without complication ([place])    TYPE 2  . Erectile dysfunction   . Hematospermia   . Hemorrhoids   . History of adenomatous polyp of colon   . History of kidney stones   . Hyperlipidemia   . Hypertension   . Kidney stones   . Obesity   . 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[place])   . Prediabetes     Social History   Tobacco Use  . Smoking status: Former Smoker    Quit date: 08/15/1988     Years since quitting: 31.9  . Smokeless tobacco: Never Used  . Tobacco comment: quit september 11th 1991  Vaping Use  . Vaping Use: Never used  Substance Use Topics  . Alcohol use: Yes    Alcohol/week: 12.0 standard drinks    Types: 12 Cans of beer per week  . Drug use: No    Family History  Problem Relation Age of Onset  . Colon cancer Father   . Cancer Mother   . Liver cancer Sister   . Kidney cancer Brother   . Bone cancer Brother   . Kidney disease Neg Hx   . Prostate cancer Neg Hx   . Bladder Cancer Neg Hx      OBJECTIVE: Blood pressure 138/74, pulse (!) 101, temperature 97.7 F (36.5 C), resp. rate (!) 22, height 5\' 11"  (1.803 m), weight 105.7 kg, SpO2 94 %.  Physical Exam   Physical Exam: General:  No acute distress, laying in the bed  HEENT  anicteric, moist oral mucous membrane  Pulm/lungs  normal breathing effort, lungs are clear to auscultation  CVS/Heart  regular rhythm, no rub or gallop  Abdomen:   Soft, nontender, obese  Extremities:  No peripheral edema  Neurologic:  Alert, oriented, able to follow commands  Skin:  No acute rashes     Lab Results Lab Results  Component Value Date   WBC 7.3 07/09/2020   HGB 12.5 (L) 07/09/2020   HCT 35.8 (L) 07/09/2020   MCV 88.2 07/09/2020   PLT 229 07/09/2020    Lab Results  Component Value Date   CREATININE 0.55 (L) 07/09/2020   BUN 16 07/09/2020   NA 115 (LL) 07/09/2020   K 4.9 07/09/2020   CL 82 (L) 07/09/2020   CO2 23 07/09/2020    Lab Results  Component Value Date   ALT 18 07/09/2020   AST 48 (H) 07/09/2020   ALKPHOS 249 (H) 07/09/2020   BILITOT 0.8 07/09/2020     Microbiology: Recent Results (from the past 240 hour(s))  Resp Panel by RT-PCR (Flu A&B, Covid) Nasopharyngeal Swab     Status: None   Collection Time: 07/09/20 11:03 AM   Specimen: Nasopharyngeal Swab; Nasopharyngeal(NP) swabs in vial transport medium  Result Value Ref Range Status   SARS Coronavirus 2 by RT PCR NEGATIVE  NEGATIVE Final    Comment: (NOTE) SARS-CoV-2 target nucleic acids are NOT DETECTED.  The SARS-CoV-2 RNA is generally detectable in upper respiratory specimens during the acute phase of infection. The lowest concentration of SARS-CoV-2 viral copies this assay can detect is 138 copies/mL. A negative result does not preclude SARS-Cov-2 infection and should not be used as the sole basis for treatment or other patient management decisions. A negative result may occur with  improper specimen collection/handling, submission of specimen other than nasopharyngeal swab, presence of viral mutation(s) within the areas targeted by this assay, and inadequate number of viral copies(<138 copies/mL). A negative result must be combined with clinical observations, patient history, and epidemiological information. The expected result is Negative.  Fact Sheet for Patients:  EntrepreneurPulse.com.au  Fact Sheet for Healthcare Providers:  IncredibleEmployment.be  This test is no t yet approved or cleared by the [place] FDA and  has been authorized for detection and/or diagnosis of SARS-CoV-2 by FDA under an Emergency Use Authorization (EUA). This EUA will remain  in effect (meaning this test can be used) for the duration of the COVID-19 declaration under Section 564(b)(1) of the Act, 21 U.S.C.section 360bbb-3(b)(1), unless the authorization is terminated  or revoked sooner.       Influenza A by PCR NEGATIVE NEGATIVE Final   Influenza B by PCR NEGATIVE NEGATIVE Final    Comment: (NOTE) The Xpert Xpress SARS-CoV-2/FLU/RSV plus assay is intended as an aid in the diagnosis of influenza from Nasopharyngeal swab specimens and should not be used as a sole basis for treatment. Nasal washings and aspirates are unacceptable for Xpert Xpress SARS-CoV-2/FLU/RSV testing.  Fact Sheet for Patients: EntrepreneurPulse.com.au  Fact Sheet for Healthcare  Providers: IncredibleEmployment.be  This test is not yet approved or cleared by the [place] FDA and has been authorized for detection and/or diagnosis of SARS-CoV-2 by FDA under an Emergency Use Authorization (EUA). This EUA will remain in effect (meaning this test can be used) for the duration of the COVID-19 declaration under Section 564(b)(1) of the Act, 21 U.S.C. section 360bbb-3(b)(1), unless the authorization is terminated or revoked.  Performed at [facility], [address] [place], [place] [address]     Medications: Scheduled Meds: Continuous Infusions: PRN Meds:.acetaminophen, hydrALAZINE, ondansetron (ZOFRAN) IV  Allergies  Allergen Reactions  . Atorvastatin Other (See Comments)    Muscle pain.    Urinalysis: No results for input(s): COLORURINE, LABSPEC, PHURINE, GLUCOSEU, HGBUR, BILIRUBINUR, KETONESUR, PROTEINUR, UROBILINOGEN, NITRITE, LEUKOCYTESUR in the last 72 hours.  Invalid input(s): APPERANCEUR  Imaging: DG Chest Portable 1 View  Result Date: 07/09/2020 CLINICAL DATA:  Chemotherapy for lung cancer. EXAM: PORTABLE CHEST 1 VIEW COMPARISON:  05/06/2020 FINDINGS: Power port on the right has its tip at the SVC RA junction. The left chest is clear. There is a sub pulmonic effusion on the right and a small amount of fluid in the fissure. Volume loss in the right lower lung. 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 IMPRESSION: 1. Subpulmonic effusion on the right with some atelectasis of the right lower lung. 2. 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 Electronically Signed   By: Nelson Chimes M.D.   On: 07/09/2020 11:31      Assessment/Plan:  Fred Anderson is a 75 y.o. 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  Started a moderate rate of 35 cc/h and check sodium every 2 hours Goal for correction to about 123-125 by tomorrow morning    Neel Buffone Candiss Norse 07/09/20

## 2020-07-09 NOTE — ED Notes (Signed)
Implanted port accessed at oncology visit prior to arrival to ED. Transparent dressing present over needle, dressing is clean, dry, intact. Patient undressed from waist up and placed in gown. Stretcher locked in low position with side rails up x2, call light in reach. Wife is present at bedside.

## 2020-07-09 NOTE — Progress Notes (Signed)
Severe hyponatremia- 119 [sodium level) called by Cheri Rous in cancer center lab dept at 939. Read back process performed with lab tech. Dr. Rogue Bussing informed at 940 of critical value. md recommend patient be transported to the ER asap. ER provider can reach Dr. Rogue Bussing directly for hand off.

## 2020-07-09 NOTE — ED Provider Notes (Signed)
[facility] Emergency Department Provider Note    Event Date/Time   First MD Initiated Contact with Patient 07/09/20 1032     (approximate)  I have reviewed the triage vital signs and the nursing notes.   HISTORY  Chief Complaint Abnormal Lab    HPI Fred Anderson is a 75 y.o. male below listed past medical history presents to the ER for evaluation of confusion generalized malaise and weakness.  Patient undergoing chemotherapy for stage IV lung cancer.  Was at infusion center this morning had routine blood work showing evidence of significant hyponatremia.  Has had very poor p.o. intake over the past several days and weak.    Past Medical History:  Diagnosis Date  . Arthritis   . Blood in semen   . BPH (benign prostatic hypertrophy)   . Cancer ([place])    melanoma on back  . DDD (degenerative disc disease), lumbar   . Diabetes mellitus without complication ([place])    TYPE 2  . Erectile dysfunction   . Hematospermia   . Hemorrhoids   . History of adenomatous polyp of colon   . History of kidney stones   . Hyperlipidemia   . Hypertension   . Kidney stones   . Obesity   . 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[place])   . Prediabetes    Family History  Problem Relation Age of Onset  . Colon cancer Father   . Cancer Mother   . Liver cancer Sister   . Kidney cancer Brother   . Bone cancer Brother   . Kidney disease Neg Hx   . Prostate cancer Neg Hx   . Bladder Cancer Neg Hx    Past Surgical History:  Procedure Laterality Date  . BACK SURGERY  age 16   Broken back  . COLONOSCOPY WITH PROPOFOL N/A 05/05/2017   Procedure: COLONOSCOPY WITH PROPOFOL;  Surgeon: Toledo, Benay Pike, MD;  Location: [facility] ENDOSCOPY;  Service: Gastroenterology;  Laterality: N/A;  . HEMORRHOID SURGERY    . IR IMAGING GUIDED PORT INSERTION  05/17/2020  . LAPAROSCOPIC APPENDECTOMY N/A 04/25/2018   Procedure: APPENDECTOMY LAPAROSCOPIC;  Surgeon: Herbert Pun, MD;  Location: [facility] ORS;  Service: General;  Laterality: N/A;  . SHOULDER ARTHROSCOPY WITH OPEN ROTATOR CUFF REPAIR Right 04/03/2019   Procedure: SHOULDER ARTHROSCOPY WITH SUBSACAPULARIS REPAIR, OPEN ROTATOR CUFF REPAIR, SUBARCOMIAL DECOMPRESSION, BICEPS TENODESIS;  Surgeon: Leim Fabry, MD;  Location: [facility] ORS;  Service: Orthopedics;  Laterality: Right;  . SMALL INTESTINE SURGERY     Patient Active Problem List   Diagnosis Date Noted  . Cancer of upper lobe of right lung ([place]) 05/13/2020  . Goals of care, counseling/discussion 05/13/2020  . Mass of upper lobe of right lung 05/07/2020  . Headache in front of head 05/07/2020  . Status post rotator cuff repair 04/03/2019  . Acute appendicitis with localized peritonitis 04/25/2018  . DDD (degenerative disc disease), lumbar 11/29/2017  . Fecal smearing 03/18/2017  . Family history of colon cancer 12/15/2016  . History of adenomatous polyp of colon 09/18/2016  . High risk medication use 09/14/2016  . Diabetic peripheral neuropathy associated with type 2 diabetes mellitus ([place]) 09/05/2015  . Erectile dysfunction of organic origin 11/29/2014  . BPH with obstruction/lower urinary tract symptoms 09/02/2014  . Hematospermia 09/02/2014  . HLD (hyperlipidemia) 02/05/2014  . Borderline diabetes 02/05/2014  . Combined hyperlipidemia associated with type 2 diabetes mellitus ([place]) 02/05/2014  . Type 2 diabetes mellitus with neurologic complication, without long-term current  use of insulin ([place]) 02/05/2014      Prior to Admission medications   Medication Sig Start Date End Date Taking? Authorizing Provider  acetaminophen (TYLENOL) 500 MG tablet Take 1,000 mg by mouth every 6 (six) hours as needed.    [provider]  albuterol (VENTOLIN HFA) 108 (90 Base) MCG/ACT inhaler Inhale 2 puffs into the lungs every 6 (six) hours as needed for wheezing or shortness of breath. Patient not taking: Reported on 07/09/2020 05/23/20   Cammie Sickle, MD  aspirin 325 MG tablet Take 325 mg by mouth daily.    [provider]  baclofen (LIORESAL) 20 MG tablet Take 1 tablet (20 mg total) by mouth 3 (three) times daily. 07/03/20   Jacquelin Hawking, NP  budesonide-formoterol (SYMBICORT) 160-4.5 MCG/ACT inhaler Inhale 2 puffs into the lungs in the morning and at bedtime. Patient not taking: Reported on 07/09/2020 06/03/20   Cammie Sickle, MD  chlorpheniramine-HYDROcodone (TUSSIONEX) 10-8 MG/5ML SUER Take 5 mLs by mouth at bedtime as needed for cough. Patient not taking: Reported on 07/09/2020 05/23/20   Cammie Sickle, MD  citalopram (CELEXA) 20 MG tablet Take 1 tablet (20 mg total) by mouth daily. 06/25/20   Cammie Sickle, MD  dexamethasone (DECADRON) 4 MG tablet Take one pill AM & PM x 2 days; one day before and one day after chemo. None on the day of chemo. 05/13/20   Cammie Sickle, MD  folic acid (FOLVITE) 1 MG tablet Take 1 tablet (1 mg total) by mouth daily. 05/13/20   Cammie Sickle, MD  gabapentin (NEURONTIN) 300 MG capsule Start take 1 pill at night x3 days; then one pill twice a day. 06/18/20   Cammie Sickle, MD  lidocaine-prilocaine (EMLA) cream Apply 1 application topically as needed. 05/13/20   Cammie Sickle, MD  lovastatin (MEVACOR) 40 MG tablet Take 40 mg by mouth at bedtime.  10/03/17   [provider]  metFORMIN (GLUCOPHAGE) 500 MG tablet Take 1,000 mg by mouth 2 (two) times daily. 03/02/19   [provider]  Multiple Vitamin (MULTIVITAMIN WITH MINERALS) TABS tablet Take 1 tablet by mouth daily.    [provider]  Omega-3 Fatty Acids (FISH OIL) 500 MG CAPS Take 500 mg by mouth 2 (two) times daily.     [provider]  ondansetron (ZOFRAN) 8 MG tablet One pill every 8 hours as needed for nausea/vomitting. 05/13/20   Cammie Sickle, MD  oxyCODONE (OXY IR/ROXICODONE) 5 MG immediate release tablet Take 1 tablet (5 mg total) by mouth  every 8 (eight) hours as needed for severe pain. 06/11/20   Cammie Sickle, MD  prochlorperazine (COMPAZINE) 10 MG tablet Take 1 tablet (10 mg total) by mouth every 6 (six) hours as needed for nausea or vomiting. 05/13/20   Cammie Sickle, MD  tamsulosin (FLOMAX) 0.4 MG CAPS capsule Take 1 capsule (0.4 mg total) by mouth daily. 12/08/19   Nori Riis, PA-C    Allergies Atorvastatin    Social History Social History   Tobacco Use  . Smoking status: Former Smoker    Quit date: 08/15/1988    Years since quitting: 31.9  . Smokeless tobacco: Never Used  . Tobacco comment: quit september 11th 1991  Vaping Use  . Vaping Use: Never used  Substance Use Topics  . Alcohol use: Yes    Alcohol/week: 12.0 standard drinks    Types: 12 Cans of beer per week  . Drug  use: No    Review of Systems Patient denies headaches, rhinorrhea, blurry vision, numbness, shortness of breath, chest pain, edema, cough, abdominal pain, nausea, vomiting, diarrhea, dysuria, fevers, rashes or hallucinations unless otherwise stated above in HPI. ____________________________________________   PHYSICAL EXAM:  VITAL SIGNS: Vitals:   07/09/20 1017 07/09/20 1020  BP:  (!) 144/67  Pulse: 86   Resp: 17   Temp:  97.7 F (36.5 C)  SpO2: 96%     Constitutional: Alert and oriented.  Eyes: Conjunctivae are normal.  Head: Atraumatic. Nose: No congestion/rhinnorhea. Mouth/Throat: Mucous membranes are moist.   Neck: No stridor. Painless ROM.  Cardiovascular: Normal rate, regular rhythm. Grossly normal heart sounds.  Good peripheral circulation. Respiratory: Normal respiratory effort.  No retractions. Lungs CTAB. Gastrointestinal: Soft and nontender. No distention. No abdominal bruits. No CVA tenderness. Genitourinary:  Musculoskeletal: No lower extremity tenderness nor edema.  No joint effusions. Neurologic:  Normal speech and language. No gross focal neurologic deficits are appreciated. No facial  droop Skin:  Skin is warm, dry and intact. No rash noted. Psychiatric: Mood and affect are normal. Speech and behavior are normal.  ____________________________________________   LABS (all labs ordered are listed, but only abnormal results are displayed)  Results for orders placed or performed in visit on 07/09/20 (from the past 24 hour(s))  CBC with Differential     Status: Abnormal   Collection Time: 07/09/20  8:21 AM  Result Value Ref Range   WBC 7.3 4.0 - 10.5 K/uL   RBC 4.06 (L) 4.22 - 5.81 MIL/uL   Hemoglobin 12.5 (L) 13.0 - 17.0 g/dL   HCT 35.8 (L) 39.0 - 52.0 %   MCV 88.2 80.0 - 100.0 fL   MCH 30.8 26.0 - 34.0 pg   MCHC 34.9 30.0 - 36.0 g/dL   RDW 13.7 11.5 - 15.5 %   Platelets 229 150 - 400 K/uL   nRBC 0.0 0.0 - 0.2 %   Neutrophils Relative % 70 %   Neutro Abs 5.1 1.7 - 7.7 K/uL   Lymphocytes Relative 15 %   Lymphs Abs 1.1 0.7 - 4.0 K/uL   Monocytes Relative 13 %   Monocytes Absolute 0.9 0.1 - 1.0 K/uL   Eosinophils Relative 0 %   Eosinophils Absolute 0.0 0.0 - 0.5 K/uL   Basophils Relative 0 %   Basophils Absolute 0.0 0.0 - 0.1 K/uL   Immature Granulocytes 2 %   Abs Immature Granulocytes 0.11 (H) 0.00 - 0.07 K/uL  Comprehensive metabolic panel     Status: Abnormal   Collection Time: 07/09/20  8:21 AM  Result Value Ref Range   Sodium 119 (LL) 135 - 145 mmol/L   Potassium 4.6 3.5 - 5.1 mmol/L   Chloride 84 (L) 98 - 111 mmol/L   CO2 23 22 - 32 mmol/L   Glucose, Bld 174 (H) 70 - 99 mg/dL   BUN 18 8 - 23 mg/dL   Creatinine, Ser 0.62 0.61 - 1.24 mg/dL   Calcium 9.0 8.9 - 10.3 mg/dL   Total Protein 7.0 6.5 - 8.1 g/dL   Albumin 3.4 (L) 3.5 - 5.0 g/dL   AST 48 (H) 15 - 41 U/L   ALT 18 0 - 44 U/L   Alkaline Phosphatase 249 (H) 38 - 126 U/L   Total Bilirubin 0.8 0.3 - 1.2 mg/dL   GFR, Estimated >60 >60 mL/min   Anion gap 12 5 - 15   ____________________________________________  ____________________________________________  RADIOLOGY  I personally reviewed  all radiographic  images ordered to evaluate for the above acute complaints and reviewed radiology reports and findings.  These findings were personally discussed with the patient.  Please see medical record for radiology report.   ____________________________________________   PROCEDURES  Procedure(s) performed:  .Critical Care Performed by: Merlyn Lot, MD Authorized by: Merlyn Lot, MD   Critical care provider statement:    Critical care time (minutes):  35   Critical care time was exclusive of:  Separately billable procedures and treating other patients   Critical care was necessary to treat or prevent imminent or life-threatening deterioration of the following conditions:  Metabolic crisis   Critical care was time spent personally by me on the following activities:  Development of treatment plan with patient or surrogate, discussions with consultants, evaluation of patient's response to treatment, examination of patient, obtaining history from patient or surrogate, ordering and performing treatments and interventions, ordering and review of laboratory studies, ordering and review of radiographic studies, pulse oximetry, re-evaluation of patient's condition and review of old charts      Critical Care performed: yes ____________________________________________   INITIAL IMPRESSION / ASSESSMENT AND PLAN / ED COURSE  Pertinent labs & imaging results that were available during my care of the patient were reviewed by me and considered in my medical decision making (see chart for details).   DDX: Dehydration, sepsis, pna, uti, hypoglycemia, cva, drug effect,   Griffey C Elza is a 75 y.o. who presents to the ED with presentation as described above.  Patient mildly confused but answer questions appropriately.  Is reportedly having severe poor p.o. intake here with evidence of hyponatremia.  Will give fluids.  Will check chest x-ray as he is reportedly history of effusions  not hypoxic right now.  The patient will be placed on continuous pulse oximetry and telemetry for monitoring.  Laboratory evaluation will be sent to evaluate for the above complaints.     Clinical Course as of 07/09/20 1128  Tue Jul 09, 2020  1125 Case discussed with Dr. Merrilee Jansky of intensivist.  Given his presentation recommending normal saline bolus and infusion and admission to hospitalist.  Will repeat BMP in still not correcting appropriately we will then do hypertonic.  Patient does appear hemodynamically stable and appropriate for admission to hospital. [PR]    Clinical Course User Index [PR] Merlyn Lot, MD    The patient was evaluated in Emergency Department today for the symptoms described in the history of present illness. He/she was evaluated in the context of the global COVID-19 pandemic, which necessitated consideration that the patient might be at risk for infection with the SARS-CoV-2 virus that causes COVID-19. Institutional protocols and algorithms that pertain to the evaluation of patients at risk for COVID-19 are in a state of rapid change based on information released by regulatory bodies including the CDC and federal and state organizations. These policies and algorithms were followed during the patient's care in the ED.  As part of my medical decision making, I reviewed the following data within the [place] notes reviewed and incorporated, Labs reviewed, notes from prior ED visits and [place] Controlled Substance Database   ____________________________________________   FINAL CLINICAL IMPRESSION(S) / ED DIAGNOSES  Final diagnoses:  Confusion  Hyponatremia      NEW MEDICATIONS STARTED DURING THIS VISIT:  New Prescriptions   No medications on file     Note:  This document was prepared using Dragon voice recognition software and may include unintentional dictation errors.  Merlyn Lot, MD 07/09/20 1128

## 2020-07-09 NOTE — ED Triage Notes (Addendum)
Pt was sent from the cancer center with low Na+ levels and confusion, pt is currently getting chemotherapy for lung CA, last tx was 3 weeks ago and was scheduled for today but unable to tolerate the tx at this time, wife is at the bedside porta cath is accessed PTA

## 2020-07-10 ENCOUNTER — Inpatient Hospital Stay: Payer: Medicare Other

## 2020-07-10 DIAGNOSIS — E871 Hypo-osmolality and hyponatremia: Secondary | ICD-10-CM

## 2020-07-10 LAB — CBC
HCT: 32.1 % — ABNORMAL LOW (ref 39.0–52.0)
Hemoglobin: 11.3 g/dL — ABNORMAL LOW (ref 13.0–17.0)
MCH: 30.9 pg (ref 26.0–34.0)
MCHC: 35.2 g/dL (ref 30.0–36.0)
MCV: 87.7 fL (ref 80.0–100.0)
Platelets: 191 10*3/uL (ref 150–400)
RBC: 3.66 MIL/uL — ABNORMAL LOW (ref 4.22–5.81)
RDW: 13.8 % (ref 11.5–15.5)
WBC: 7.9 10*3/uL (ref 4.0–10.5)
nRBC: 0 % (ref 0.0–0.2)

## 2020-07-10 LAB — BASIC METABOLIC PANEL
Anion gap: 8 (ref 5–15)
Anion gap: 7 (ref 5–15)
BUN: 12 mg/dL (ref 8–23)
BUN: 10 mg/dL (ref 8–23)
CO2: 24 mmol/L (ref 22–32)
CO2: 24 mmol/L (ref 22–32)
Calcium: 8.6 mg/dL — ABNORMAL LOW (ref 8.9–10.3)
Calcium: 8.6 mg/dL — ABNORMAL LOW (ref 8.9–10.3)
Chloride: 87 mmol/L — ABNORMAL LOW (ref 98–111)
Chloride: 93 mmol/L — ABNORMAL LOW (ref 98–111)
Creatinine, Ser: 0.41 mg/dL — ABNORMAL LOW (ref 0.61–1.24)
Creatinine, Ser: 0.43 mg/dL — ABNORMAL LOW (ref 0.61–1.24)
GFR, Estimated: >60 mL/min (ref >60)
GFR, Estimated: >60 mL/min (ref >60)
Glucose, Bld: 123 mg/dL — ABNORMAL HIGH (ref 70–99)
Glucose, Bld: 111 mg/dL — ABNORMAL HIGH (ref 70–99)
Potassium: 4.2 mmol/L (ref 3.5–5.1)
Potassium: 4.2 mmol/L (ref 3.5–5.1)
Sodium: 119 mmol/L — CL (ref 135–145)
Sodium: 124 mmol/L — ABNORMAL LOW (ref 135–145)

## 2020-07-10 LAB — SODIUM
Sodium: 122 mmol/L — ABNORMAL LOW (ref 135–145)
Sodium: 124 mmol/L — ABNORMAL LOW (ref 135–145)
Sodium: 125 mmol/L — ABNORMAL LOW (ref 135–145)
Sodium: 127 mmol/L — ABNORMAL LOW (ref 135–145)

## 2020-07-10 LAB — CORTISOL: Cortisol, Plasma: 18.4 ug/dL

## 2020-07-10 MED ORDER — BISACODYL 5 MG PO TBEC
5.0000 mg | DELAYED_RELEASE_TABLET | Freq: Every day | ORAL | Status: DC | PRN
  Administered 2020-07-11: 5 mg via ORAL
  Filled 2020-07-10: qty 1

## 2020-07-10 MED ORDER — ENSURE ENLIVE PO LIQD
237.0000 mL | Freq: Two times a day (BID) | ORAL | Status: DC
  Administered 2020-07-12: 237 mL via ORAL

## 2020-07-10 MED ORDER — GADOBUTROL 1 MMOL/ML IV SOLN
10.0000 mL | Freq: Once | INTRAVENOUS | Status: AC | PRN
  Administered 2020-07-10: 10 mL via INTRAVENOUS

## 2020-07-10 MED ORDER — POLYETHYLENE GLYCOL 3350 17 G PO PACK
17.0000 g | PACK | Freq: Every day | ORAL | Status: DC | PRN
  Administered 2020-07-11: 17 g via ORAL
  Filled 2020-07-10: qty 1

## 2020-07-10 MED ORDER — SENNOSIDES-DOCUSATE SODIUM 8.6-50 MG PO TABS
1.0000 | ORAL_TABLET | Freq: Every day | ORAL | Status: DC
  Administered 2020-07-10 – 2020-07-11 (×2): 1 via ORAL
  Filled 2020-07-10 (×2): qty 1

## 2020-07-10 NOTE — Progress Notes (Signed)
PROGRESS NOTE    BRYTEN MAHER  GYJ:[id number] DOB: 1946-03-11 DOA: 07/09/2020 PCP: Ezequiel Kayser, MD  Outpatient Specialists: [facility]    Brief Narrative:    Fred Anderson is a 75 y.o. male with medical history significant of NSCLC on chemotherapy, HTN, HLD, pre-DM, depression, BPH, melanoma, kidney stone, who presents with generalized weakness, confusion.  Per his wife, pt is currently doing chemotherapy, last dose of treatment was 3 weeks ago.  He developed generalized weakness in past several days, which has been progressively worsening.  Since this morning, patient has been confused.  He has poor appetite and decreased oral intake.  Patient has nausea, mostly dry heaves, no vomiting, abdominal pain or diarrhea.  Patient denies chest pain, cough, shortness of breath.  No symptoms of UTI.  No fever or chills.  When I saw patient in the emergency room, patient is mildly confused, but is still orientated x3.  He moves all extremities.  No facial droop or slurred speech.  Patient was seen by his oncologist, Dr. Rogue Bussing today, found to have low sodium, and sent to hospital for further evaluation and treatment.   ED Course: pt was found to have sodium 119, WBC 7.3, renal function okay, negative COVID PCR, temperature normal, blood pressure 149/82, heart rate 100, RR 23, oxygen saturation 94% on room air. 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 uosm 750, serum osm 253, u sodium 71 - this likely  siadh due to pt's lung cancer. Started on hypertonic saline and sodium improved to 124 this morning. Patient says weakness has improved somewhat.  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  Currently on chemotherapy, last dose was 3 weeks ago. -Consulted Dr. Lynett Fish of oncology. Will plan on outpt w/u with thoracentesis of pleural effusion and MRI of spine at some point this hospitalization  Type 2 diabetes mellitus with neurologic complication, without long-term current use of insulin ([place]): glucose wnl -Sliding scale insulin  HTN (hypertension) Controlled -IV hydralazine as needed  Depression -Celexa  Acute metabolic encephalopathy: Likely due to hyponatremia. -Frequent neuro check -Follow-up CT head   DVT prophylaxis: lovenox Code Status: partial - cpr but no intubation Family Communication: none @ bedside  Level of care: Stepdown Status is: Inpatient  Remains inpatient appropriate because:Inpatient level of care appropriate due to severity of illness   Dispo: The patient is from: Home              Anticipated d/c is to: Home              Patient currently is not medically stable to d/c.   Difficult to place patient No  Consultants:  Oncology, nephrology  Procedures: none  Antimicrobials:  none    Subjective: This morning feeling a bit better. Limited appetite. Right sciatica pain. No vomiting. Thinks is less confused and weak.  Objective: Vitals:   07/10/20 0500 07/10/20 0505 07/10/20 0600 07/10/20 0730  BP:  136/76 136/72   Pulse: 90 93 87 93  Resp: 20 16 16 19   Temp:    (!) 97.4 F  (36.3 C)  TempSrc:    Oral  SpO2: 91% 90% (!) 86% 92%  Weight:      Height:        Intake/Output Summary (Last 24 hours) at 07/10/2020 0819 Last data filed at 07/10/2020 0748 Gross per 24 hour  Intake 780.29 ml  Output 750 ml  Net 30.29 ml   Filed Weights   07/09/20 1018 07/09/20 1711  Weight: 105.7 kg 104.6 kg    Examination:  General exam: Appears calm and comfortable  Respiratory system: rales at bases Cardiovascular system: S1 & S2 heard, RRR. No JVD, murmurs, rubs, gallops or clicks. No pedal edema. Gastrointestinal system: Abdomen is nondistended, soft and nontender. No organomegaly or masses felt. Normal bowel sounds heard. Central nervous system: Alert and oriented. No focal neurological deficits. Extremities: no edema Skin: No rashes, lesions or ulcers Psychiatry: Judgement and insight appear normal. Mood & affect appropriate.     Data Reviewed: I have personally reviewed following labs and imaging studies  CBC: Recent Labs  Lab 07/09/20 0821 07/10/20 0105  WBC 7.3 7.9  NEUTROABS 5.1  --   HGB 12.5* 11.3*  HCT 35.8* 32.1*  MCV 88.2 87.7  PLT 229 962   Basic Metabolic Panel: Recent Labs  Lab 07/09/20 0821 07/09/20 1150 07/09/20 1515 07/09/20 1916 07/09/20 2129 07/10/20 0105 07/10/20 0335 07/10/20 0644  NA 119* 115*   < > 118* 118* 119* 122* 124*  K 4.6 4.9  --  4.4  --  4.2  --  4.2  CL 84* 82*  --  85*  --  87*  --  93*  CO2 23 23  --  25  --  24  --  24  GLUCOSE 174* 154*  --  139*  --  123*  --  111*  BUN 18 16  --  13  --  12  --  10  CREATININE 0.62 0.55*  --  0.53*  --  0.41*  --  0.43*  CALCIUM 9.0 8.9  --  8.7*  --  8.6*  --  8.6*   < > = values in this interval not displayed.   GFR: Estimated Creatinine Clearance: 99.7 mL/min (A) (by C-G formula based on SCr of 0.43 mg/dL (L)). Liver Function Tests: Recent Labs  Lab 07/09/20 0821  AST 48*  ALT 18  ALKPHOS 249*  BILITOT 0.8  PROT 7.0  ALBUMIN 3.4*   No results for  input(s): LIPASE, AMYLASE in the last 168 hours. No results for input(s): AMMONIA in the last 168 hours. Coagulation Profile: No results for input(s): INR, PROTIME in the last 168 hours. Cardiac Enzymes: No results for input(s): CKTOTAL, CKMB, CKMBINDEX, TROPONINI in the last 168 hours. BNP (last 3 results) No results for input(s): PROBNP in the last 8760 hours. HbA1C: No results for input(s): HGBA1C in the last 72 hours. CBG: Recent Labs  Lab 07/09/20 1713  GLUCAP 188*   Lipid Profile: No results for input(s): CHOL, HDL, LDLCALC, TRIG, CHOLHDL, LDLDIRECT in the last 72 hours. Thyroid Function Tests: No results for  input(s): TSH, T4TOTAL, FREET4, T3FREE, THYROIDAB in the last 72 hours. Anemia Panel: No results for input(s): VITAMINB12, FOLATE, FERRITIN, TIBC, IRON, RETICCTPCT in the last 72 hours. Urine analysis:    Component Value Date/Time   COLORURINE YELLOW (A) 05/13/2020 1450   APPEARANCEUR CLEAR (A) 05/13/2020 1450   LABSPEC 1.027 05/13/2020 1450   PHURINE 5.0 05/13/2020 1450   GLUCOSEU NEGATIVE 05/13/2020 1450   HGBUR NEGATIVE 05/13/2020 1450   BILIRUBINUR NEGATIVE 05/13/2020 1450   KETONESUR NEGATIVE 05/13/2020 1450   PROTEINUR NEGATIVE 05/13/2020 1450   NITRITE NEGATIVE 05/13/2020 1450   LEUKOCYTESUR NEGATIVE 05/13/2020 1450   Sepsis Labs: @LABRCNTIP (procalcitonin:4,lacticidven:4)  ) Recent Results (from the past 240 hour(s))  Resp Panel by RT-PCR (Flu A&B, Covid) Nasopharyngeal Swab     Status: None   Collection Time: 07/09/20 11:03 AM   Specimen: Nasopharyngeal Swab; Nasopharyngeal(NP) swabs in vial transport medium  Result Value Ref Range Status   SARS Coronavirus 2 by RT PCR NEGATIVE NEGATIVE Final    Comment: (NOTE) SARS-CoV-2 target nucleic acids are NOT DETECTED.  The SARS-CoV-2 RNA is generally detectable in upper respiratory specimens during the acute phase of infection. The lowest concentration of SARS-CoV-2 viral copies this assay can detect  is 138 copies/mL. A negative result does not preclude SARS-Cov-2 infection and should not be used as the sole basis for treatment or other patient management decisions. A negative result may occur with  improper specimen collection/handling, submission of specimen other than nasopharyngeal swab, presence of viral mutation(s) within the areas targeted by this assay, and inadequate number of viral copies(<138 copies/mL). A negative result must be combined with clinical observations, patient history, and epidemiological information. The expected result is Negative.  Fact Sheet for Patients:  EntrepreneurPulse.com.au  Fact Sheet for Healthcare Providers:  IncredibleEmployment.be  This test is no t yet approved or cleared by the [place] FDA and  has been authorized for detection and/or diagnosis of SARS-CoV-2 by FDA under an Emergency Use Authorization (EUA). This EUA will remain  in effect (meaning this test can be used) for the duration of the COVID-19 declaration under Section 564(b)(1) of the Act, 21 U.S.C.section 360bbb-3(b)(1), unless the authorization is terminated  or revoked sooner.       Influenza A by PCR NEGATIVE NEGATIVE Final   Influenza B by PCR NEGATIVE NEGATIVE Final    Comment: (NOTE) The Xpert Xpress SARS-CoV-2/FLU/RSV plus assay is intended as an aid in the diagnosis of influenza from Nasopharyngeal swab specimens and should not be used as a sole basis for treatment. Nasal washings and aspirates are unacceptable for Xpert Xpress SARS-CoV-2/FLU/RSV testing.  Fact Sheet for Patients: EntrepreneurPulse.com.au  Fact Sheet for Healthcare Providers: IncredibleEmployment.be  This test is not yet approved or cleared by the [place] FDA and has been authorized for detection and/or diagnosis of SARS-CoV-2 by FDA under an Emergency Use Authorization (EUA). This EUA will remain in effect  (meaning this test can be used) for the duration of the COVID-19 declaration under Section 564(b)(1) of the Act, 21 U.S.C. section 360bbb-3(b)(1), unless the authorization is terminated or revoked.  Performed at [facility], [address] [place], [place] [address]   MRSA PCR Screening     Status: None   Collection Time: 07/09/20  5:25 PM   Specimen: Nasal Mucosa; Nasopharyngeal  Result Value Ref Range Status   MRSA by PCR NEGATIVE NEGATIVE Final    Comment:        The GeneXpert MRSA Assay (FDA approved for NASAL  specimens only), is one component of a comprehensive MRSA colonization surveillance program. It is not intended to diagnose MRSA infection nor to guide or monitor treatment for MRSA infections. Performed at [facility], [address], [place], [place] [address]          Radiology Studies: CT HEAD WO CONTRAST  Result Date: 07/09/2020 CLINICAL DATA:  Initial evaluation for acute mental status change, unknown cause. EXAM: CT HEAD WITHOUT CONTRAST TECHNIQUE: Contiguous axial images were obtained from the base of the skull through the vertex without intravenous contrast. COMPARISON:  Previous MRI from 05/09/2020. FINDINGS: Brain: Moderately advanced cerebral atrophy. Associated mild chronic microvascular ischemic disease. No acute intracranial hemorrhage. No acute large vessel territory infarct. No intraparenchymal mass lesion, mass effect, or midline shift. No hydrocephalus or extra-axial fluid collection. Vascular: No hyperdense vessel. Scattered vascular calcifications noted within the carotid siphons. Skull: Scalp soft tissues within normal limits. Calvarium intact. Small probable metastatic lesion involving the clivus is grossly similar to previous MRI. No other visible discrete osseous lesions. Sinuses/Orbits: Globes and orbital soft tissues demonstrate no acute finding. Paranasal sinuses are largely clear. No mastoid effusion. Other: None.  IMPRESSION: 1. No acute intracranial abnormality. 2. Moderately advanced cerebral atrophy with mild chronic small vessel ischemic disease. 3. Small probable metastatic lesion involving the clivus, grossly similar to previous MRI. Electronically Signed   By: Jeannine Boga M.D.   On: 07/09/2020 22:33   DG Chest Portable 1 View  Result Date: 07/09/2020 CLINICAL DATA:  Chemotherapy for lung cancer. EXAM: PORTABLE CHEST 1 VIEW COMPARISON:  05/06/2020 FINDINGS: Power port on the right has its tip at the SVC RA junction. The left chest is clear. There is a sub pulmonic effusion on the right and a small amount of fluid in the fissure. Volume loss in the right lower lung. 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 IMPRESSION: 1. Subpulmonic effusion on the right with some atelectasis of the right lower lung. 2. 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 Electronically Signed   By: Nelson Chimes M.D.   On: 07/09/2020 11:31        Scheduled Meds: . aspirin  325 mg Oral Daily  . baclofen  20 mg Oral TID  . Chlorhexidine Gluconate Cloth  6 each Topical Q2200  . citalopram  20 mg Oral Daily  . enoxaparin (LOVENOX) injection  0.5 mg/kg Subcutaneous Q24H  . folic acid  1 mg Oral Daily  . gabapentin  300 mg Oral TID  . mometasone-formoterol  2 puff Inhalation BID  . multivitamin with minerals  1 tablet Oral Daily  . pravastatin  40 mg Oral q1800  . tamsulosin  0.4 mg Oral Daily   Continuous Infusions: . sodium chloride (hypertonic) 67 mL/hr at 07/10/20 0748     LOS: 1 day    Time spent: 52 min    Desma Maxim, MD [facility]   If 7PM-7AM, please contact night-coverage [email or web address] Password [facility] 07/10/2020, 8:19 AM

## 2020-07-10 NOTE — Assessment & Plan Note (Addendum)
#  75 year old male patient with a history of metastatic/stage IV lung cancer currently admitted to hospital for severe hyponatremia.  # Right lung adenocarcinoma/stage IV; s/p 2 cycles of carbo Alimta Keytruda; April 25th CT-stable to improved right upper lobe lung masses; mildly increased right-sided pleural effusion.  Continue hold further systemic chemotherapy at this time-see below  # Severe hyponatremia- 119 [sodium] secondary to chemotherapy-SIADH- Sodium 130 this morning.  Discussed regarding free water fluid restriction; increase salt intake.  Will defer to Dr. Candiss Norse nephrology regarding tolvaptan.    #Right-sided malignant pleural effusion-symptomatic-s/p therapeutic thoracentesis-see discussion below.  #Back pain/Right hip pain/radiating to right leg with twitching and numbness-we reviewed the MRI findings that showed significant thoracic/lumbar spine metastases; however no evidence of epidural involvement/spinal cord compression.  Patient reluctant with radiation given the potential side effects.  However given minimal side effects expected from pelvic radiation recommend consideration of at least a discussion with Dr. Donella Stade.  I have discussed with Dr. Donella Stade; referral in place.   #DNR/DNI-I would agree with DNR/DNI given his incurable cancer.   #I had a long discussion with the patient/wife and daughter-discussed that it is unclear to me why patient keeps reaccumulating pleural effusion.  Possibilities include lack of response/progress of therapy versus too early to assess response.  I would recommend not making decisions regarding future of chemotherapy at this time.  However in the next few days last few weeks-if patient continues to decline, I think it is reasonable to consider hospice.  We will follow him closely.  Discussed with [employer].  Patient is clinically stable from oncology standpoint to be discharged, discussed with Dr.Wouk.   # 40 minutes face-to-face with the patient  discussing the above plan of care; more than 50% of time spent on prognosis/ natural history; counseling and coordination.

## 2020-07-10 NOTE — Progress Notes (Signed)
[facility] for Hypertonic Saline Indication: Hyponatremia  Allergies  Allergen Reactions  . Atorvastatin Other (See Comments)    Muscle pain.    Patient Measurements: Height: 5\' 11"  (180.3 cm) Weight: 104.6 kg (230 lb 9.6 oz) IBW/kg (Calculated) : 75.3  Vital Signs: Temp: 97.4 F (36.3 C) (04/27 0730) Temp Source: Oral (04/27 0730) BP: 128/68 (04/27 0800) Pulse Rate: 60 (04/27 0800) Intake/Output from previous day: 04/26 0701 - 04/27 0700 In: -  Out: 750 [Urine:750] Intake/Output from this shift: Total I/O In: 1140.3 [P.O.:360; I.V.:780.3] Out: 125 [Urine:125]  Labs: Recent Labs    07/09/20 0821 07/09/20 1150 07/09/20 1916 07/10/20 0105 07/10/20 0644  WBC 7.3  --   --  7.9  --   HGB 12.5*  --   --  11.3*  --   HCT 35.8*  --   --  32.1*  --   PLT 229  --   --  191  --   CREATININE 0.62   < > 0.53* 0.41* 0.43*  ALBUMIN 3.4*  --   --   --   --   PROT 7.0  --   --   --   --   AST 48*  --   --   --   --   ALT 18  --   --   --   --   ALKPHOS 249*  --   --   --   --   BILITOT 0.8  --   --   --   --    < > = values in this interval not displayed.   Estimated Creatinine Clearance: 99.7 mL/min (A) (by C-G formula based on SCr of 0.43 mg/dL (L)).   Assessment: 75 y.o. male with medical history significant of NSCLC on chemotherapy, HTN, HLD, pre-DM, depression, BPH, melanoma, kidney stone, who presents with generalized weakness, confusion. Pharmacy has been consulted for hypertonic saline due to severe hyponatremia. Na on admission was 115. Hypertonic saline started @35ml /hr.   4/26 @1515  Na 115 4/26 @1720  Na 117 4/26 @1916  Na 118 4/27 @2129  Na 118 - rate increased to 28ml/hr  4/27 @0105  Na 119 - rate increased to 50ml/hr 4/27 @0335  Na 122 4/27 @0644  Na 124 - rate decreased to 40ml/hr 4/27 @0928  Na 124  Goal of Therapy:  Na WNL - increase by 4-6 mEq/L in 4-6 hours  Plan:   Continue hypertonic saline and titrate as  needed to correct Na level  Will notify MD if Na increase >4 mEq/L in 2 hours or >6 mEq/L in 4 hours  Continue to monitor Na q4h  Sherilyn Banker, PharmD Pharmacy Resident  07/10/2020 1:06 PM

## 2020-07-10 NOTE — Progress Notes (Signed)
Na improved from 118 (beginning of shift @ 1900) to 122 this AM. 3% saline increased to 67 ml/hr overnight. Pt Ox4. CT head completed overnight. Pt voiding independently.

## 2020-07-10 NOTE — Progress Notes (Signed)
Initial Nutrition Assessment  DOCUMENTATION CODES:   Obesity unspecified  INTERVENTION:   -Magic cup TID with meals, each supplement provides 290 kcal and 9 grams of protein -Ensure Enlive po BID, each supplement provides 350 kcal and 20 grams of protein -Continue MVI with minerals daily  NUTRITION DIAGNOSIS:   Increased nutrient needs related to cancer and cancer related treatments as evidenced by estimated needs.  GOAL:   Patient will meet greater than or equal to 90% of their needs  MONITOR:   PO intake,Supplement acceptance,Labs,Weight trends,Skin,I & O's  REASON FOR ASSESSMENT:   Malnutrition Screening Tool    ASSESSMENT:   Fred Anderson is a 75 y.o. male with medical history significant of NSCLC on chemotherapy, HTN, HLD, pre-DM, depression, BPH, melanoma, kidney stone, who presents with generalized weakness, confusion.  Pt admitted with hyponatremia.   Reviewed I/O's: -750 ml x 24 hours  UOP: 750 ml x 24 hours  Spoke with pt, daughter, and wife at bedside. Pt wife reports pt has experienced a decreased appetite secondary to side effects from his last chemotherapy treatment on 06/18/20. Per pt, his intake has decreased to eating less than 75% of what he used to (Breakfast: pack of nabs; Lunch and Dinner: "handful of a meal"). Pt also started consuming 2-3 Boost or Premier Protein supplements daily. He has been craving sweets and has been consuming a liberalized diet (whatever he desires) per the guidance of his oncologist.   Pt shares that his intake has improved since hospitalization. He reports he consumed most of his breakfast, lunch, and dinner trays.  Pt shares that his UBW is around 250#. Pt wife shares that he has lost about 5 pounds over the past week. Reviewed wt hx; pt has experienced a 5.5% wt loss over the past month, which is significant for time frame. Pt also shares he has been much more weak over the past week or so, but has not experienced any falls  at home.   Discussed importance of good meal and supplement intake to promote healing. Encouraged liberalized diet to promote nutritional adequacy during times of poor oral intake. Pt also amenable to oral nutrition supplements.   Medications reviewed and include folic acid and 3% sodium chloride solution @ 30 ml/hr.   No results found for: HGBA1C PTA DM medications are .   Labs reviewed: Na: 124 (inpatient orders for glycemic control are ).   NUTRITION - FOCUSED PHYSICAL EXAM:  Flowsheet Row Most Recent Value  Orbital Region No depletion  Upper Arm Region Mild depletion  Thoracic and Lumbar Region No depletion  Buccal Region No depletion  Temple Region No depletion  Clavicle Bone Region No depletion  Clavicle and Acromion Bone Region No depletion  Scapular Bone Region No depletion  Dorsal Hand No depletion  Patellar Region No depletion  Anterior Thigh Region No depletion  Posterior Calf Region No depletion  Edema (RD Assessment) None  Hair Reviewed  Eyes Reviewed  Mouth Reviewed  Skin Reviewed  Nails Reviewed       Diet Order:   Diet Order            Diet renal with fluid restriction Fluid restriction: Other (see comments); Room service appropriate? Yes; Fluid consistency: Thin  Diet effective now                 EDUCATION NEEDS:   Education needs have been addressed  Skin:  Skin Assessment: Reviewed RN Assessment  Last BM:  Unknown  Height:  Ht Readings from Last 1 Encounters:  07/09/20 5\' 11"  (1.803 m)    Weight:   Wt Readings from Last 1 Encounters:  07/09/20 104.6 kg    Ideal Body Weight:  78.2 kg  BMI:  Body mass index is 32.16 kg/m.  Estimated Nutritional Needs:   Kcal:  2150-2350  Protein:  120-135 grams  Fluid:  per MD    Loistine Chance, RD, LDN, [place] Registered Dietitian II Certified Diabetes Care and Education Specialist Please refer to [facility] for RD and/or RD on-call/weekend/after hours pager

## 2020-07-10 NOTE — Progress Notes (Signed)
[facility] Date of Admission:  07/09/2020           Reason for Consult:  hyponatremia   Referring Provider: Gwynne Edinger, MD Primary Care Provider: Ezequiel Kayser, MD   History of Presenting Illness:  Fred Anderson is a 75 y.o. 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  Currently undergoing chemotherapy with  carbo-alimta-keytruda and is followed by Dr. Rogue Bussing at the cancer center. Presented to the ER because of feeling weak, decreased oral intake and some dyspnea on exertion.   Found to have severe hyponatremia  Today, he feels better He is able to tolerate diet without nausea or vomiting Sitting up in chair when seen   OBJECTIVE: Blood pressure 128/68, pulse 60, temperature (!) 97.4 F (36.3 C), temperature source Oral, resp. rate 17, height 5\' 11"  (1.803 m), weight 104.6 kg, SpO2 (!) 89 %.    Physical Exam: General:  No acute distress,    HEENT  anicteric, moist oral mucous membrane  Pulm/lungs  normal breathing effort, lungs are clear to auscultation  CVS/Heart  regular rhythm, no rub or gallop  Abdomen:   Soft, nontender, obese  Extremities:  No peripheral edema  Neurologic:  Alert, oriented, able to follow commands  Skin:  No acute rashes     Lab Results Lab Results  Component Value Date   WBC 7.9 07/10/2020   HGB 11.3 (L) 07/10/2020   HCT 32.1 (L) 07/10/2020   MCV 87.7 07/10/2020   PLT 191 07/10/2020    Lab Results  Component Value Date   CREATININE 0.43 (L) 07/10/2020   BUN 10 07/10/2020   NA 124 (L) 07/10/2020   K 4.2 07/10/2020   CL 93 (L) 07/10/2020   CO2 24 07/10/2020    Lab Results  Component Value Date   ALT 18 07/09/2020   AST 48 (H) 07/09/2020   ALKPHOS 249 (H) 07/09/2020   BILITOT 0.8 07/09/2020     Microbiology: Recent Results (from the past 240 hour(s))  Resp Panel by RT-PCR (Flu A&B, Covid) Nasopharyngeal Swab      Status: None   Collection Time: 07/09/20 11:03 AM   Specimen: Nasopharyngeal Swab; Nasopharyngeal(NP) swabs in vial transport medium  Result Value Ref Range Status   SARS Coronavirus 2 by RT PCR NEGATIVE NEGATIVE Final    Comment: (NOTE) SARS-CoV-2 target nucleic acids are NOT DETECTED.  The SARS-CoV-2 RNA is generally detectable in upper respiratory specimens during the acute phase of infection. The lowest concentration of SARS-CoV-2 viral copies this assay can detect is 138 copies/mL. A negative result does not preclude SARS-Cov-2 infection and should not be used as the sole basis for treatment or other patient management decisions. A negative result may occur with  improper specimen collection/handling, submission of specimen other than nasopharyngeal swab, presence of viral mutation(s) within the areas targeted by this assay, and inadequate number of viral copies(<138 copies/mL). A negative result must be combined with clinical observations, patient history, and epidemiological information. The expected result is Negative.  Fact Sheet for Patients:  EntrepreneurPulse.com.au  Fact Sheet for Healthcare Providers:  IncredibleEmployment.be  This test is no t yet approved or cleared by the [place] FDA and  has been authorized for detection and/or diagnosis of SARS-CoV-2 by FDA under an Emergency Use Authorization (EUA). This EUA will remain  in effect (meaning this test can be used) for the duration of the COVID-19 declaration  under Section 564(b)(1) of the Act, 21 U.S.C.section 360bbb-3(b)(1), unless the authorization is terminated  or revoked sooner.       Influenza A by PCR NEGATIVE NEGATIVE Final   Influenza B by PCR NEGATIVE NEGATIVE Final    Comment: (NOTE) The Xpert Xpress SARS-CoV-2/FLU/RSV plus assay is intended as an aid in the diagnosis of influenza from Nasopharyngeal swab specimens and should not be used as a sole basis  for treatment. Nasal washings and aspirates are unacceptable for Xpert Xpress SARS-CoV-2/FLU/RSV testing.  Fact Sheet for Patients: EntrepreneurPulse.com.au  Fact Sheet for Healthcare Providers: IncredibleEmployment.be  This test is not yet approved or cleared by the [place] FDA and has been authorized for detection and/or diagnosis of SARS-CoV-2 by FDA under an Emergency Use Authorization (EUA). This EUA will remain in effect (meaning this test can be used) for the duration of the COVID-19 declaration under Section 564(b)(1) of the Act, 21 U.S.C. section 360bbb-3(b)(1), unless the authorization is terminated or revoked.  Performed at [facility], [address] [place], [place] [address]   MRSA PCR Screening     Status: None   Collection Time: 07/09/20  5:25 PM   Specimen: Nasal Mucosa; Nasopharyngeal  Result Value Ref Range Status   MRSA by PCR NEGATIVE NEGATIVE Final    Comment:        The GeneXpert MRSA Assay (FDA approved for NASAL specimens only), is one component of a comprehensive MRSA colonization surveillance program. It is not intended to diagnose MRSA infection nor to guide or monitor treatment for MRSA infections. Performed at [facility], [address] [place], [place] [address]     Medications: Scheduled Meds: . aspirin  325 mg Oral Daily  . baclofen  20 mg Oral TID  . Chlorhexidine Gluconate Cloth  6 each Topical Q2200  . citalopram  20 mg Oral Daily  . enoxaparin (LOVENOX) injection  0.5 mg/kg Subcutaneous Q24H  . folic acid  1 mg Oral Daily  . gabapentin  300 mg Oral TID  . mometasone-formoterol  2 puff Inhalation BID  . multivitamin with minerals  1 tablet Oral Daily  . pravastatin  40 mg Oral q1800  . tamsulosin  0.4 mg Oral Daily   Continuous Infusions: . sodium chloride (hypertonic) 30 mL/hr at 07/10/20 0831   PRN Meds:.acetaminophen, albuterol, hydrALAZINE,  lidocaine-prilocaine, ondansetron (ZOFRAN) IV, oxyCODONE  Allergies  Allergen Reactions  . Atorvastatin Other (See Comments)    Muscle pain.    Urinalysis: No results for input(s): COLORURINE, LABSPEC, PHURINE, GLUCOSEU, HGBUR, BILIRUBINUR, KETONESUR, PROTEINUR, UROBILINOGEN, NITRITE, LEUKOCYTESUR in the last 72 hours.  Invalid input(s): APPERANCEUR    Imaging: CT HEAD WO CONTRAST  Result Date: 07/09/2020 CLINICAL DATA:  Initial evaluation for acute mental status change, unknown cause. EXAM: CT HEAD WITHOUT CONTRAST TECHNIQUE: Contiguous axial images were obtained from the base of the skull through the vertex without intravenous contrast. COMPARISON:  Previous MRI from 05/09/2020. FINDINGS: Brain: Moderately advanced cerebral atrophy. Associated mild chronic microvascular ischemic disease. No acute intracranial hemorrhage. No acute large vessel territory infarct. No intraparenchymal mass lesion, mass effect, or midline shift. No hydrocephalus or extra-axial fluid collection. Vascular: No hyperdense vessel. Scattered vascular calcifications noted within the carotid siphons. Skull: Scalp soft tissues within normal limits. Calvarium intact. Small probable metastatic lesion involving the clivus is grossly similar to previous MRI. No other visible discrete osseous lesions. Sinuses/Orbits: Globes and orbital soft tissues demonstrate no acute finding. Paranasal sinuses are largely clear. No mastoid effusion. Other:  None. IMPRESSION: 1. No acute intracranial abnormality. 2. Moderately advanced cerebral atrophy with mild chronic small vessel ischemic disease. 3. Small probable metastatic lesion involving the clivus, grossly similar to previous MRI. Electronically Signed   By: Jeannine Boga M.D.   On: 07/09/2020 22:33   DG Chest Portable 1 View  Result Date: 07/09/2020 CLINICAL DATA:  Chemotherapy for lung cancer. EXAM: PORTABLE CHEST 1 VIEW COMPARISON:  05/06/2020 FINDINGS: Power port on the right  has its tip at the SVC RA junction. The left chest is clear. There is a sub pulmonic effusion on the right and a small amount of fluid in the fissure. Volume loss in the right lower lung. 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 IMPRESSION: 1. Subpulmonic effusion on the right with some atelectasis of the right lower lung. 2. 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 Electronically Signed   By: Nelson Chimes M.D.   On: 07/09/2020 11:31      Assessment/Plan:  Fred Anderson is a 75 y.o. 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

## 2020-07-10 NOTE — Consult Note (Signed)
[facility] CONSULT NOTE  Patient Care Team: Ezequiel Kayser, MD as PCP - General (Internal Medicine) Telford Nab, RN as Oncology Nurse Navigator  CHIEF COMPLAINTS/PURPOSE OF CONSULTATION: Metastatic lung cancer  HISTORY OF PRESENTING ILLNESS:  Fred Anderson 75 y.o.  male pleasant patient with a history lung cancer -adenocarcinoma stage IV -currently on [place] Alimta-keytruda  is currently in the hospital for severe hyponatremia.  Patient was seen in the clinic yesterday for consideration of cycle #3 of chemotherapy.  However patient complains of significant fatigue/somnolence -on work-up showed to have a sodium of 119.  Surveillance CT scan of the chest noted to have-stable disease mildly increased right-sided pleural effusion.  Patient was admitted to hospital for work-up and treatment of hyponatremia.  Patient was started on 3% hypertonic saline; and overnight patient's sodium levels improved to 124.  Continues with ongoing shortness of breath on exertion.  Poor appetite.   Review of Systems  Constitutional: Positive for malaise/fatigue and weight loss. Negative for chills, diaphoresis and fever.  HENT: Negative for nosebleeds and sore throat.   Eyes: Negative for double vision.  Respiratory: Positive for cough and shortness of breath. Negative for hemoptysis, sputum production and wheezing.   Cardiovascular: Negative for chest pain, palpitations, orthopnea and leg swelling.  Gastrointestinal: Negative for abdominal pain, blood in stool, constipation, diarrhea, heartburn, melena, nausea and vomiting.  Genitourinary: Negative for dysuria, frequency and urgency.  Musculoskeletal: Positive for back pain and joint pain.  Skin: Negative.  Negative for itching and rash.  Neurological: Negative for dizziness, tingling, focal weakness, weakness and headaches.  Endo/Heme/Allergies: Does not bruise/bleed easily.  Psychiatric/Behavioral: Negative for depression. The patient is  not nervous/anxious and does not have insomnia.      MEDICAL HISTORY:  Past Medical History:  Diagnosis Date  . Arthritis   . Blood in semen   . BPH (benign prostatic hypertrophy)   . Cancer ([place])    melanoma on back  . DDD (degenerative disc disease), lumbar   . Diabetes mellitus without complication ([place])    TYPE 2  . Erectile dysfunction   . Hematospermia   . Hemorrhoids   . History of adenomatous polyp of colon   . History of kidney stones   . Hyperlipidemia   . Hypertension   . Kidney stones   . Obesity   . 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[place])   . Prediabetes     SURGICAL HISTORY: Past Surgical History:  Procedure Laterality Date  . BACK SURGERY  age 14   Broken back  . COLONOSCOPY WITH PROPOFOL N/A 05/05/2017   Procedure: COLONOSCOPY WITH PROPOFOL;  Surgeon: Toledo, Benay Pike, MD;  Location: [facility] ENDOSCOPY;  Service: Gastroenterology;  Laterality: N/A;  . HEMORRHOID SURGERY    . IR IMAGING GUIDED PORT INSERTION  05/17/2020  . LAPAROSCOPIC APPENDECTOMY N/A 04/25/2018   Procedure: APPENDECTOMY LAPAROSCOPIC;  Surgeon: Herbert Pun, MD;  Location: [facility] ORS;  Service: General;  Laterality: N/A;  . SHOULDER ARTHROSCOPY WITH OPEN ROTATOR CUFF REPAIR Right 04/03/2019   Procedure: SHOULDER ARTHROSCOPY WITH SUBSACAPULARIS REPAIR, OPEN ROTATOR CUFF REPAIR, SUBARCOMIAL DECOMPRESSION, BICEPS TENODESIS;  Surgeon: Leim Fabry, MD;  Location: [facility] ORS;  Service: Orthopedics;  Laterality: Right;  . SMALL INTESTINE SURGERY      SOCIAL HISTORY: Social History   Socioeconomic History  . Marital status: Married    Spouse name: Not on file  . Number of children: Not on file  . Years of education: Not on file  . Highest education  level: Not on file  Occupational History  . Not on file  Tobacco Use  . Smoking status: Former Smoker    Quit date: 08/15/1988    Years since quitting: 31.9  . Smokeless tobacco: Never Used  . Tobacco comment: quit september  11th 1991  Vaping Use  . Vaping Use: Never used  Substance and Sexual Activity  . Alcohol use: Yes    Alcohol/week: 12.0 standard drinks    Types: 12 Cans of beer per week  . Drug use: No  . Sexual activity: Not on file  Other Topics Concern  . Not on file  Social History Narrative   Quit smoking in 1991; used to smoke 3 ppd. Lives in [place]; with wife; and 2 daughters. 2-3 drink 4/ week. Worked in Tourist information centre manager; retired in 2013; raised cattle.    Social Determinants of Health   Financial Resource Strain: Not on file  Food Insecurity: Not on file  Transportation Needs: Not on file  Physical Activity: Not on file  Stress: Not on file  Social Connections: Not on file  Intimate Partner Violence: Not on file    FAMILY HISTORY: Family History  Problem Relation Age of Onset  . Colon cancer Father   . Cancer Mother   . Liver cancer Sister   . Kidney cancer Brother   . Bone cancer Brother   . Kidney disease Neg Hx   . Prostate cancer Neg Hx   . Bladder Cancer Neg Hx     ALLERGIES:  is allergic to atorvastatin.  MEDICATIONS:  Current Facility-Administered Medications  Medication Dose Route Frequency Provider Last Rate Last Admin  . acetaminophen (TYLENOL) tablet 650 mg  650 mg Oral Q6H PRN Ivor Costa, MD      . albuterol (VENTOLIN HFA) 108 (90 Base) MCG/ACT inhaler 2 puff  2 puff Inhalation Q6H PRN Ivor Costa, MD      . aspirin tablet 325 mg  325 mg Oral Daily Ivor Costa, MD   325 mg at 07/10/20 0942  . baclofen (LIORESAL) tablet 20 mg  20 mg Oral TID Ivor Costa, MD   20 mg at 07/10/20 1617  . bisacodyl (DULCOLAX) EC tablet 5 mg  5 mg Oral Daily PRN Opyd, Ilene Qua, MD      . Chlorhexidine Gluconate Cloth 2 % PADS 6 each  6 each Topical Q2200 Ivor Costa, MD   6 each at 07/09/20 2120  . citalopram (CELEXA) tablet 20 mg  20 mg Oral Daily Ivor Costa, MD   20 mg at 07/10/20 0942  . enoxaparin (LOVENOX) injection 52.5 mg  0.5 mg/kg Subcutaneous Q24H Ivor Costa, MD   52.5 mg at  07/09/20 2122  . feeding supplement (ENSURE ENLIVE / ENSURE PLUS) liquid 237 mL  237 mL Oral BID BM Wouk, Ailene Rud, MD      . folic acid (FOLVITE) tablet 1 mg  1 mg Oral Daily Ivor Costa, MD   1 mg at 07/10/20 0942  . gabapentin (NEURONTIN) capsule 300 mg  300 mg Oral TID Ivor Costa, MD   300 mg at 07/10/20 1617  . hydrALAZINE (APRESOLINE) injection 5 mg  5 mg Intravenous Q2H PRN Ivor Costa, MD      . lidocaine-prilocaine (EMLA) cream 1 application  1 application Topical PRN Ivor Costa, MD      . mometasone-formoterol [facility]) 200-5 MCG/ACT inhaler 2 puff  2 puff Inhalation BID Ivor Costa, MD   2 puff at 07/10/20 1925  . multivitamin with minerals  tablet 1 tablet  1 tablet Oral Daily Ivor Costa, MD   1 tablet at 07/10/20 [phone]  . ondansetron (ZOFRAN) injection 4 mg  4 mg Intravenous Q8H PRN Ivor Costa, MD      . oxyCODONE (Oxy IR/ROXICODONE) immediate release tablet 5 mg  5 mg Oral Q8H PRN Ivor Costa, MD      . polyethylene glycol (MIRALAX / GLYCOLAX) packet 17 g  17 g Oral Daily PRN Opyd, Ilene Qua, MD      . pravastatin (PRAVACHOL) tablet 40 mg  40 mg Oral q1800 Ivor Costa, MD   40 mg at 07/10/20 1617  . senna-docusate (Senokot-S) tablet 1 tablet  1 tablet Oral QHS Opyd, Timothy S, MD      . sodium chloride (hypertonic) 3 % solution   Intravenous Continuous Gwynne Edinger, MD 30 mL/hr at 07/10/20 1222 New Bag at 07/10/20 1222  . tamsulosin (FLOMAX) capsule 0.4 mg  0.4 mg Oral Daily Ivor Costa, MD   0.4 mg at 07/10/20 0942      .  PHYSICAL EXAMINATION:  Vitals:   07/10/20 1600 07/10/20 1800  BP: (!) 164/83 129/73  Pulse: (!) 105 (!) 115  Resp: (!) 23 (!) 27  Temp:    SpO2: 91% 93%   Filed Weights   07/09/20 1018 07/09/20 1711  Weight: 233 lb (105.7 kg) 230 lb 9.6 oz (104.6 kg)    Physical Exam Constitutional:      Comments: Patient sitting in the chair.  No acute distress.  HENT:     Head: Normocephalic and atraumatic.     Mouth/Throat:     Pharynx: No oropharyngeal  exudate.  Eyes:     Pupils: Pupils are equal, round, and reactive to light.  Cardiovascular:     Rate and Rhythm: Normal rate and regular rhythm.  Pulmonary:     Effort: No respiratory distress.     Breath sounds: No wheezing.     Comments: Decreased breath sounds right side. Abdominal:     General: Bowel sounds are normal. There is no distension.     Palpations: Abdomen is soft. There is no mass.     Tenderness: There is no abdominal tenderness. There is no guarding or rebound.  Musculoskeletal:        General: No tenderness. Normal range of motion.     Cervical back: Normal range of motion and neck supple.  Skin:    General: Skin is warm.  Neurological:     Mental Status: He is alert and oriented to person, place, and time.  Psychiatric:        Mood and Affect: Affect normal.      LABORATORY DATA:  I have reviewed the data as listed Lab Results  Component Value Date   WBC 7.9 07/10/2020   HGB 11.3 (L) 07/10/2020   HCT 32.1 (L) 07/10/2020   MCV 87.7 07/10/2020   PLT 191 07/10/2020   Recent Labs    06/18/20 0849 06/25/20 1441 07/09/20 0821 07/09/20 1150 07/09/20 1916 07/09/20 2129 07/10/20 0105 07/10/20 0335 07/10/20 0644 07/10/20 0928 07/10/20 1440 07/10/20 2028  NA 131* 136 119*   < > 118*   < > 119*   < > 124* 124* 125* 127*  K 4.3 4.5 4.6   < > 4.4  --  4.2  --  4.2  --   --   --   CL 98 102 84*   < > 85*  --  87*  --  93*  --   --   --  CO2 22 23 23    < > 25  --  24  --  24  --   --   --   GLUCOSE 188* 117* 174*   < > 139*  --  123*  --  111*  --   --   --   BUN 13 23 18    < > 13  --  12  --  10  --   --   --   CREATININE 0.50* 0.77 0.62   < > 0.53*  --  0.41*  --  0.43*  --   --   --   CALCIUM 9.2 8.8* 9.0   < > 8.7*  --  8.6*  --  8.6*  --   --   --   GFRNONAA >60 >60 >60   < > >60  --  >60  --  >60  --   --   --   PROT 7.3 7.0 7.0  --   --   --   --   --   --   --   --   --   ALBUMIN 3.5 3.3* 3.4*  --   --   --   --   --   --   --   --   --   AST  28 45* 48*  --   --   --   --   --   --   --   --   --   ALT 16 44 18  --   --   --   --   --   --   --   --   --   ALKPHOS 174* 160* 249*  --   --   --   --   --   --   --   --   --   BILITOT 0.7 0.5 0.8  --   --   --   --   --   --   --   --   --    < > = values in this interval not displayed.    RADIOGRAPHIC STUDIES: I have personally reviewed the radiological images as listed and agreed with the findings in the report. CT HEAD WO CONTRAST  Result Date: 07/09/2020 CLINICAL DATA:  Initial evaluation for acute mental status change, unknown cause. EXAM: CT HEAD WITHOUT CONTRAST TECHNIQUE: Contiguous axial images were obtained from the base of the skull through the vertex without intravenous contrast. COMPARISON:  Previous MRI from 05/09/2020. FINDINGS: Brain: Moderately advanced cerebral atrophy. Associated mild chronic microvascular ischemic disease. No acute intracranial hemorrhage. No acute large vessel territory infarct. No intraparenchymal mass lesion, mass effect, or midline shift. No hydrocephalus or extra-axial fluid collection. Vascular: No hyperdense vessel. Scattered vascular calcifications noted within the carotid siphons. Skull: Scalp soft tissues within normal limits. Calvarium intact. Small probable metastatic lesion involving the clivus is grossly similar to previous MRI. No other visible discrete osseous lesions. Sinuses/Orbits: Globes and orbital soft tissues demonstrate no acute finding. Paranasal sinuses are largely clear. No mastoid effusion. Other: None. IMPRESSION: 1. No acute intracranial abnormality. 2. Moderately advanced cerebral atrophy with mild chronic small vessel ischemic disease. 3. Small probable metastatic lesion involving the clivus, grossly similar to previous MRI. Electronically Signed   By: Jeannine Boga M.D.   On: 07/09/2020 22:33   CT Chest W Contrast  Result Date: 07/06/2020 CLINICAL DATA:  Follow-up metastatic right lung non-small cell carcinoma.  Currently  undergoing chemotherapy. EXAM: CT CHEST WITH CONTRAST TECHNIQUE: Multidetector CT imaging of the chest was performed during intravenous contrast administration. CONTRAST:  61mL OMNIPAQUE IOHEXOL 300 MG/ML  SOLN COMPARISON:  PET-CT on 05/15/2019 FINDINGS: Cardiovascular: No acute findings. Aortic and coronary atherosclerotic calcification noted. Mediastinum/Nodes: Mediastinal lymphadenopathy in right paratracheal region remains stable measuring 2.2 cm short axis. Right hilar lymphadenopathy also shows no significant change measuring 2.5 cm. No new or increased sites of lymphadenopathy identified. Lungs/Pleura: Irregular masslike opacity in the central right upper lobe shows no significant change, measuring 4.4 x 2.7 cm on image 59/2. An irregular pulmonary nodule in the anterior right upper lobe shows mild decrease in size, currently measuring 2.0 x 1.8 cm on image 43/3 compared to 2.3 x 2.2 cm previously. Several sub-cm adjacent sub pleural nodules in the anterior right middle lobe remains stable. Focal opacity in the anterior right upper lobe is unchanged and consistent with postobstructive atelectasis or pneumonitis. Moderate to large right pleural effusion shows mild increase in size since previous study. Diffuse pleural based soft tissue nodularity shows no significant change and indicates a malignant pleural effusion. Upper Abdomen:  Unremarkable. Musculoskeletal:  No suspicious bone lesions. IMPRESSION: Slight decrease in size of anterior right upper lobe pulmonary nodule. Stable masslike opacity in central right upper lobe, and stable right hilar and right paratracheal lymphadenopathy. Stable opacity in medial right upper lobe, likely due to postobstructive atelectasis or pneumonitis. Mild increase in size of malignant right pleural effusion. Aortic Atherosclerosis (ICD10-I70.0). Electronically Signed   By: Marlaine Hind M.D.   On: 07/06/2020 16:51   MR THORACIC SPINE W WO CONTRAST  Result Date:  07/10/2020 CLINICAL DATA:  Metastatic non-small cell lung cancer. Back pain. History of back surgery as a teenager. EXAM: MRI THORACIC WITHOUT AND WITH CONTRAST TECHNIQUE: Multiplanar and multiecho pulse sequences of the thoracic spine were obtained without and with intravenous contrast. CONTRAST:  70mL GADAVIST GADOBUTROL 1 MMOL/ML IV SOLN COMPARISON:  CT chest 07/05/2020 FINDINGS: Alignment:  Normal Vertebrae: Chronic compression fracture T12 with posterior wire fixation. Widespread metastatic disease throughout the bone marrow of the thoracic spine with all thoracic levels involved. Diffuse rib involvement. No acute fracture. No epidural tumor. Cord:  Negative for cord compression.  Cord signal normal. Paraspinal and other soft tissues: Large right pleural effusion Disc levels: Negative for thoracic spinal stenosis. Small central disc protrusion at T7-8. 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 No pathologic fracture. No cord compression. Large right pleural effusion Chronic fracture T12 with posterior wire fixation. Electronically Signed   By: Franchot Gallo M.D.   On: 07/10/2020 14:41   MR Lumbar Spine W Wo Contrast  Result Date: 07/10/2020 CLINICAL DATA:  Non-small-cell lung cancer. Back pain. History of back surgery as a teenager. EXAM: MRI LUMBAR SPINE WITHOUT AND WITH CONTRAST TECHNIQUE: Multiplanar and multiecho pulse sequences of the lumbar spine were obtained without and with intravenous contrast. CONTRAST:  31mL GADAVIST GADOBUTROL 1 MMOL/ML IV SOLN COMPARISON:  Lumbar MRI 11/01/2009 FINDINGS: Segmentation:  Normal Alignment:  Mild retrolisthesis L2-3 Vertebrae: Widespread skeletal metastatic disease. Numerous small lesions are seen throughout the bone marrow of the lumbar spine, sacrum, and iliac bones. No pathologic fracture. Chronic fracture T12 with posterior wire fixation as noted on thoracic MRI. Conus medullaris  and cauda equina: Conus extends to the L1-2 level. Conus and cauda equina appear normal. Paraspinal and other soft tissues: Negative for paraspinous mass or adenopathy. Right pleural effusion Disc levels:  L1-2: Negative L2-3: Mild retrolisthesis. Diffuse disc bulging and spurring without stenosis. Interval resolution of right foraminal disc protrusion seen on the prior MRI L3-4: Disc degeneration with diffuse disc bulging and spurring. Bilateral facet degeneration. Mild subarticular stenosis bilaterally L4-5: Disc degeneration with diffuse disc bulging and bilateral facet hypertrophy. Mild to moderate spinal stenosis. Moderate subarticular stenosis and foraminal stenosis on the right. L5-S1: Disc bulging and spurring. Left foraminal disc and osteophyte and left foraminal encroachment unchanged from the prior MRI. 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 No pathologic fracture. Lumbar degenerative changes. Electronically Signed   By: Franchot Gallo M.D.   On: 07/10/2020 14:46   DG Chest Portable 1 View  Result Date: 07/09/2020 CLINICAL DATA:  Chemotherapy for lung cancer. EXAM: PORTABLE CHEST 1 VIEW COMPARISON:  05/06/2020 FINDINGS: Power port on the right has its tip at the SVC RA junction. The left chest is clear. There is a sub pulmonic effusion on the right and a small amount of fluid in the fissure. Volume loss in the right lower lung. 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 IMPRESSION: 1. Subpulmonic effusion on the right with some atelectasis of the right lower lung. 2. 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 Electronically Signed   By: Nelson Chimes M.D.   On: 07/09/2020 11:31   DG HIP UNILAT WITH PELVIS 2-3 VIEWS RIGHT  Result Date: 07/03/2020 CLINICAL DATA:  Right hip and upper leg pain since 06/28/2020. History of lung cancer. No known injury. EXAM: DG HIP (WITH OR WITHOUT PELVIS) 2-3V RIGHT COMPARISON:  None. FINDINGS:  There is no evidence of hip fracture or dislocation. Mild degenerative change about the hips appear symmetric from right to left. Lower lumbar spondylosis is incompletely evaluated. Soft tissues are negative. IMPRESSION: No acute abnormality. Mild appearing bilateral hip osteoarthritis. Lower lumbar degenerative change also noted. Electronically Signed   By: Inge Rise M.D.   On: 07/03/2020 16:41   DG FEMUR, MIN 2 VIEWS RIGHT  Result Date: 07/03/2020 CLINICAL DATA:  Right hip and upper leg pain since 06/28/2020. History of lung cancer. No known injury. EXAM: RIGHT FEMUR 2 VIEWS COMPARISON:  None. FINDINGS: There is no evidence of fracture or other focal bone lesions. Soft tissues are unremarkable. IMPRESSION: Negative exam. Electronically Signed   By: Inge Rise M.D.   On: 07/03/2020 16:43    Cancer of upper lobe of right lung [facility]) #75 year old male patient with a history of metastatic/stage IV lung cancer currently admitted to hospital for severe hyponatremia.  # Right lung adenocarcinoma/stage IV; s/p 2 cycles of carbo Alimta Keytruda; April 25th CT-stable to improved right upper lobe lung masses; mildly increased right-sided pleural effusion.  Hold cycle #3 because of acute issues  # Severe hyponatremia- 119 [sodium]-factorial-chemotherapy per nutrition- ? SIADH-versus other causes-adrenal insufficiency/Keytruda.  Check cortisol.  Currently improving 124 this morning on hypertonic saline.    #Right-sided malignant pleural effusion-symptomatic; recommend therapeutic thoracentesis.  #Back pain/Right hip pain/radiating to right leg with twitching and numbness.?Sciatica-benign versus malignancy.  Recommend MRI thoracic lumbar spine while in the hospital.  # ? Bone metastasis noted on MRI brain-cervical /thoracicspine; PET scan also inconclusive; recommend MRI thoracic/lumbar spine.  # Thank you Dr.Wouk for allowing me to participate in the care of your pleasant patient. Please do not  hesitate to contact me with questions or concerns in the interim.  Discussed with Dr.Wouk.   Addendum: MRI thoracic lumbar spine shows widespread metastatic disease.  I discussed the results of  the MRI with patient's wife/daughter.  We will consider radiation symptom control after discussion with radiation oncology.  Consider Zometa.  All questions were answered. The patient knows to call the clinic with any problems, questions or concerns.    Cammie Sickle, MD 07/10/2020 9:15 PM

## 2020-07-11 ENCOUNTER — Inpatient Hospital Stay: Payer: Medicare Other

## 2020-07-11 DIAGNOSIS — Z515 Encounter for palliative care: Secondary | ICD-10-CM

## 2020-07-11 DIAGNOSIS — E871 Hypo-osmolality and hyponatremia: Secondary | ICD-10-CM | POA: Diagnosis not present

## 2020-07-11 LAB — BASIC METABOLIC PANEL
Anion gap: 7 (ref 5–15)
BUN: 15 mg/dL (ref 8–23)
CO2: 25 mmol/L (ref 22–32)
Calcium: 8.9 mg/dL (ref 8.9–10.3)
Chloride: 97 mmol/L — ABNORMAL LOW (ref 98–111)
Creatinine, Ser: 0.63 mg/dL (ref 0.61–1.24)
GFR, Estimated: >60 mL/min (ref >60)
Glucose, Bld: 138 mg/dL — ABNORMAL HIGH (ref 70–99)
Potassium: 4.3 mmol/L (ref 3.5–5.1)
Sodium: 129 mmol/L — ABNORMAL LOW (ref 135–145)

## 2020-07-11 LAB — BODY FLUID CELL COUNT WITH DIFFERENTIAL
Eos, Fluid: 0 %
Lymphs, Fluid: 83 %
Monocyte-Macrophage-Serous Fluid: 13 %
Neutrophil Count, Fluid: 4 %
Total Nucleated Cell Count, Fluid: 922 cu mm

## 2020-07-11 LAB — PROTEIN, PLEURAL OR PERITONEAL FLUID: Total protein, fluid: 4.5 g/dL

## 2020-07-11 LAB — SODIUM
Sodium: 128 mmol/L — ABNORMAL LOW (ref 135–145)
Sodium: 129 mmol/L — ABNORMAL LOW (ref 135–145)
Sodium: 130 mmol/L — ABNORMAL LOW (ref 135–145)
Sodium: 130 mmol/L — ABNORMAL LOW (ref 135–145)

## 2020-07-11 LAB — LACTATE DEHYDROGENASE, PLEURAL OR PERITONEAL FLUID: LD, Fluid: 170 U/L — ABNORMAL HIGH (ref 3–23)

## 2020-07-11 MED ORDER — TORSEMIDE 20 MG PO TABS: 10.0000 mg | ORAL_TABLET | Freq: Every day | ORAL | Status: DC

## 2020-07-11 MED ORDER — GLYCERIN (LAXATIVE) 2 G RE SUPP
1.0000 | Freq: Every day | RECTAL | Status: DC | PRN
  Filled 2020-07-11: qty 1

## 2020-07-11 MED ORDER — POLYETHYLENE GLYCOL 3350 17 G PO PACK
17.0000 g | PACK | Freq: Every day | ORAL | Status: DC
  Administered 2020-07-12: 17 g via ORAL
  Filled 2020-07-11: qty 1

## 2020-07-11 NOTE — Progress Notes (Signed)
[facility] for Hypertonic Saline Indication: Hyponatremia  Allergies  Allergen Reactions  . Atorvastatin Other (See Comments)    Muscle pain.    Patient Measurements: Height: 5\' 11"  (180.3 cm) Weight: 104.6 kg (230 lb 9.6 oz) IBW/kg (Calculated) : 75.3  Vital Signs: Temp: 98.1 F (36.7 C) (04/27 2100) Temp Source: Oral (04/27 2100) BP: 150/87 (04/28 0600) Pulse Rate: 96 (04/28 0600) Intake/Output from previous day: 04/27 0701 - 04/28 0700 In: 1635 [P.O.:360; I.V.:1275] Out: 125 [Urine:125] Intake/Output from this shift: No intake/output data recorded.  Labs: Recent Labs    07/09/20 0821 07/09/20 1150 07/10/20 0105 07/10/20 0644 07/11/20 0521  WBC 7.3  --  7.9  --   --   HGB 12.5*  --  11.3*  --   --   HCT 35.8*  --  32.1*  --   --   PLT 229  --  191  --   --   CREATININE 0.62   < > 0.41* 0.43* 0.63  ALBUMIN 3.4*  --   --   --   --   PROT 7.0  --   --   --   --   AST 48*  --   --   --   --   ALT 18  --   --   --   --   ALKPHOS 249*  --   --   --   --   BILITOT 0.8  --   --   --   --    < > = values in this interval not displayed.   Estimated Creatinine Clearance: 99.7 mL/min (by C-G formula based on SCr of 0.63 mg/dL).   Assessment: 75 y.o. male with medical history significant of NSCLC on chemotherapy, HTN, HLD, pre-DM, depression, BPH, melanoma, kidney stone, who presents with generalized weakness, confusion. Pharmacy has been consulted for hypertonic saline due to severe hyponatremia. Na on admission was 115. Hypertonic saline started @35ml /hr.   4/26 @1515  Na 115 4/26 @1720  Na 117 4/26 @1916  Na 118 4/26 @2129  Na 118 - rate increased to 74ml/hr  4/27 @0105  Na 119 - rate increased to 64ml/hr 4/27 @0335  Na 122 4/27 @0644  Na 124 - rate decreased to 40ml/hr 4/27 @0928  Na 124 4/27 @1440  Na 125 4/27 @2028  Na 127 4/28 @0019  Na 128 - discontinued d/t incr >74mEq/L in 24 hours 4/28 @0839  Na 129  Goal of Therapy:  Na  WNL - increase by 4-6 mEq/L in 4-6 hours  Plan:   Na 129 - will f/u with nephro plans for restarting hypertonic saline  Will notify MD if Na increase >4 mEq/L in 2 hours or >6 mEq/L in 4 hours or >72mEq/L in 24 hours  Continue to monitor Na q4h  Sherilyn Banker, PharmD Pharmacy Resident  07/11/2020 9:47 AM

## 2020-07-11 NOTE — Progress Notes (Signed)
Fred Anderson   DOB:[birth year]   OH#:[id number]    Subjective: Patient sitting in the edge of the bed.  Denies any significant pain.  States to have a rough night-because of constipation.  Had a bowel movement this morning.  Feels better.  Objective:  Vitals:   07/11/20 2000 07/11/20 2108  BP: 107/65 103/62  Pulse: 100 100  Resp: (!) 21 18  Temp:  97.6 F (36.4 C)  SpO2: 92% 94%     Intake/Output Summary (Last 24 hours) at 07/11/2020 2137 Last data filed at 07/11/2020 1800 Gross per 24 hour  Intake 480 ml  Output 2050 ml  Net -1570 ml    Physical Exam Constitutional:      Comments: Patient sitting in the bed no acute distress.  Accompanied by his wife.  HENT:     Head: Normocephalic and atraumatic.     Mouth/Throat:     Pharynx: No oropharyngeal exudate.  Eyes:     Pupils: Pupils are equal, round, and reactive to light.  Cardiovascular:     Rate and Rhythm: Normal rate and regular rhythm.  Pulmonary:     Effort: No respiratory distress.     Breath sounds: No wheezing.     Comments: Decreased breath on the right side compared to left. Abdominal:     General: Bowel sounds are normal. There is no distension.     Palpations: Abdomen is soft. There is no mass.     Tenderness: There is no abdominal tenderness. There is no guarding or rebound.  Musculoskeletal:        General: No tenderness. Normal range of motion.     Cervical back: Normal range of motion and neck supple.  Skin:    General: Skin is warm.  Neurological:     Mental Status: He is alert and oriented to person, place, and time.  Psychiatric:        Mood and Affect: Affect normal.      Labs:  Lab Results  Component Value Date   WBC 7.9 07/10/2020   HGB 11.3 (L) 07/10/2020   HCT 32.1 (L) 07/10/2020   MCV 87.7 07/10/2020   PLT 191 07/10/2020   NEUTROABS 5.1 07/09/2020    Lab Results  Component Value Date   NA 130 (L) 07/11/2020   K 4.3 07/11/2020   CL 97 (L) 07/11/2020   CO2 25 07/11/2020     Studies:  MR THORACIC SPINE W WO CONTRAST  Result Date: 07/10/2020 CLINICAL DATA:  Metastatic non-small cell lung cancer. Back pain. History of back surgery as a teenager. EXAM: MRI THORACIC WITHOUT AND WITH CONTRAST TECHNIQUE: Multiplanar and multiecho pulse sequences of the thoracic spine were obtained without and with intravenous contrast. CONTRAST:  93mL GADAVIST GADOBUTROL 1 MMOL/ML IV SOLN COMPARISON:  CT chest 07/05/2020 FINDINGS: Alignment:  Normal Vertebrae: Chronic compression fracture T12 with posterior wire fixation. Widespread metastatic disease throughout the bone marrow of the thoracic spine with all thoracic levels involved. Diffuse rib involvement. No acute fracture. No epidural tumor. Cord:  Negative for cord compression.  Cord signal normal. Paraspinal and other soft tissues: Large right pleural effusion Disc levels: Negative for thoracic spinal stenosis. Small central disc protrusion at T7-8. 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 No pathologic fracture. No cord compression. Large right pleural effusion Chronic fracture T12 with posterior wire fixation. Electronically Signed   By: Franchot Gallo M.D.   On: 07/10/2020 14:41   MR  Lumbar Spine W Wo Contrast  Result Date: 07/10/2020 CLINICAL DATA:  Non-small-cell lung cancer. Back pain. History of back surgery as a teenager. EXAM: MRI LUMBAR SPINE WITHOUT AND WITH CONTRAST TECHNIQUE: Multiplanar and multiecho pulse sequences of the lumbar spine were obtained without and with intravenous contrast. CONTRAST:  61mL GADAVIST GADOBUTROL 1 MMOL/ML IV SOLN COMPARISON:  Lumbar MRI 11/01/2009 FINDINGS: Segmentation:  Normal Alignment:  Mild retrolisthesis L2-3 Vertebrae: Widespread skeletal metastatic disease. Numerous small lesions are seen throughout the bone marrow of the lumbar spine, sacrum, and iliac bones. No pathologic fracture. Chronic fracture T12 with  posterior wire fixation as noted on thoracic MRI. Conus medullaris and cauda equina: Conus extends to the L1-2 level. Conus and cauda equina appear normal. Paraspinal and other soft tissues: Negative for paraspinous mass or adenopathy. Right pleural effusion Disc levels: L1-2: Negative L2-3: Mild retrolisthesis. Diffuse disc bulging and spurring without stenosis. Interval resolution of right foraminal disc protrusion seen on the prior MRI L3-4: Disc degeneration with diffuse disc bulging and spurring. Bilateral facet degeneration. Mild subarticular stenosis bilaterally L4-5: Disc degeneration with diffuse disc bulging and bilateral facet hypertrophy. Mild to moderate spinal stenosis. Moderate subarticular stenosis and foraminal stenosis on the right. L5-S1: Disc bulging and spurring. Left foraminal disc and osteophyte and left foraminal encroachment unchanged from the prior MRI. 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 No pathologic fracture. Lumbar degenerative changes. Electronically Signed   By: Franchot Gallo M.D.   On: 07/10/2020 14:46   DG Chest Port 1 View  Result Date: 07/11/2020 CLINICAL DATA:  Pleural effusion, status post right thoracentesis. EXAM: PORTABLE CHEST 1 VIEW COMPARISON:  07/09/2020 FINDINGS: Power injectable right Port-A-Cath tip: Right atrium. Reduced right pleural effusion with resolution of the blunting of the right lateral costophrenic angle. No visible pneumothorax. Atherosclerotic calcification of the aortic arch. Fatty bandlike opacity at the right lung base favoring mild residual atelectasis. IMPRESSION: 1. Substantially reduced right pleural effusion, status post right thoracentesis. No visible pneumothorax. Mild atelectasis at the right lung base. 2.  Aortic Atherosclerosis (ICD10-I70.0). Electronically Signed   By: Van Clines M.D.   On: 07/11/2020 11:05   US THORACENTESIS ASP PLEURAL SPACE W/IMG GUIDE  Result  Date: 07/11/2020 INDICATION: Patient with a history of non-small cell lung cancer presents today with a right pleural effusion. Interventional radiology asked to perform a therapeutic and diagnostic thoracentesis. EXAM: ULTRASOUND GUIDED THORACENTESIS MEDICATIONS: 1% lidocaine 10 mL COMPLICATIONS: None immediate. PROCEDURE: An ultrasound guided thoracentesis was thoroughly discussed with the patient and questions answered. The benefits, risks, alternatives and complications were also discussed. The patient understands and wishes to proceed with the procedure. Written consent was obtained. Ultrasound was performed to localize and mark an adequate pocket of fluid in the right chest. The area was then prepped and draped in the normal sterile fashion. 1% Lidocaine was used for local anesthesia. Under ultrasound guidance a 6 Fr Safe-T-Centesis catheter was introduced. Thoracentesis was performed. The catheter was removed and a dressing applied. FINDINGS: A total of approximately 2.5 L of amber-colored/blood-tinged fluid fluid was removed. Samples were sent to the laboratory as requested by the clinical team. IMPRESSION: Successful ultrasound guided right thoracentesis yielding 2.5 L of pleural fluid. Read by: Soyla Dryer, NP Electronically Signed   By: Sandi Mariscal M.D.   On: 07/11/2020 11:08    Cancer of upper lobe of right lung [facility]) #75 year old male patient with a history of metastatic/stage IV lung cancer currently admitted to hospital for  severe hyponatremia.  # Right lung adenocarcinoma/stage IV; s/p 2 cycles of carbo Alimta Keytruda; April 25th CT-stable to improved right upper lobe lung masses; mildly increased right-sided pleural effusion.  Hold cycle #3 because of acute issues  # Severe hyponatremia- 119 [sodium]-factorial-chemotherapy per nutrition- ? SIADH-versus other causes-adrenal insufficiency/Keytruda.  Cortisol normal.  Sodium 128 this morning-improving.  #Right-sided malignant pleural  effusion-symptomatic-awaiting therapeutic thoracentesis this morning.  #Back pain/Right hip pain/radiating to right leg with twitching and numbness-we reviewed the MRI findings that showed significant thoracic/lumbar spine metastases; however no evidence of epidural involvement/spinal cord compression.  Discussed with Dr. Donella Stade; radiation oncology-could reconsider palliative radiation for pain control.  #I discussed above plan with the patient and wife.  Reluctant with radiation.  Discussed with [employer].  I had a long discussion with the patient daughter,Faye regarding patient's current clinical situation; would recommend DNR/DNI.  However would recommend palliative radiation for pain control/quality of life issues.  Cammie Sickle, MD 07/11/2020  9:37 PM

## 2020-07-11 NOTE — Progress Notes (Addendum)
[place] for Hypertonic Saline Indication: Hyponatremia  Allergies  Allergen Reactions  . Atorvastatin Other (See Comments)    Muscle pain.    Patient Measurements: Height: 5\' 11"  (180.3 cm) Weight: 104.6 kg (230 lb 9.6 oz) IBW/kg (Calculated) : 75.3  Vital Signs: Temp: 98.1 F (36.7 C) (04/27 2100) Temp Source: Oral (04/27 2100) BP: 156/82 (04/27 2300) Pulse Rate: 113 (04/27 2300) Intake/Output from previous day: 04/27 0701 - 04/28 0700 In: 1308 [P.O.:360; I.V.:1215] Out: 125 [Urine:125] Intake/Output from this shift: Total I/O In: 120.1 [I.V.:120.1] Out: -   Labs: Recent Labs    07/09/20 0821 07/09/20 1150 07/09/20 1916 07/10/20 0105 07/10/20 0644  WBC 7.3  --   --  7.9  --   HGB 12.5*  --   --  11.3*  --   HCT 35.8*  --   --  32.1*  --   PLT 229  --   --  191  --   CREATININE 0.62   < > 0.53* 0.41* 0.43*  ALBUMIN 3.4*  --   --   --   --   PROT 7.0  --   --   --   --   AST 48*  --   --   --   --   ALT 18  --   --   --   --   ALKPHOS 249*  --   --   --   --   BILITOT 0.8  --   --   --   --    < > = values in this interval not displayed.   Estimated Creatinine Clearance: 99.7 mL/min (A) (by C-G formula based on SCr of 0.43 mg/dL (L)).   Assessment: 75 y.o. male with medical history significant of NSCLC on chemotherapy, HTN, HLD, pre-DM, depression, BPH, melanoma, kidney stone, who presents with generalized weakness, confusion. Pharmacy has been consulted for hypertonic saline due to severe hyponatremia. Na on admission was 115. Hypertonic saline started @35ml /hr.   4/26 @1515  Na 115 4/26 @1720  Na 117 4/26 @1916  Na 118 4/26 @2129  Na 118 - rate increased to 44ml/hr  4/27 @0105  Na 119 - rate increased to 36ml/hr 4/27 @0335  Na 122 4/27 @0644  Na 124 - rate decreased to 72ml/hr 4/27 @0928  Na 124 4/27 @1440  Na 125 4/27 @2028  Na 127 4/28 @0019  Na 128  Goal of Therapy:  Na WNL - increase by 4-6 mEq/L in 4-6  hours  Plan:   Continue hypertonic saline and titrate as needed to correct Na level  Will notify MD if Na increase >4 mEq/L in 2 hours or >6 mEq/L in 4 hours  Continue to monitor Na q4h  4/28:  Na @ 0019 = 128, this represents an increase of 9 mEq/L in ~ 24 hrs.   Contacted MD (Dr Myna Hidalgo), who agreed to d/c the hypertonic saline since rate had increased > 8 mEq in  24 hrs period of time.  Will need to f/u plans for 3% NaCl in AM with nephrology.  Will continue to monitor Na levels Q4H.   Makenzi Bannister D, PharmD 07/11/2020 12:55 AM

## 2020-07-11 NOTE — Procedures (Signed)
PROCEDURE SUMMARY:  Successful US guided right thoracentesis. Yielded 2.5 L of amber-colored/blood-tinged fluid. Pt tolerated procedure well. No immediate complications.  Specimen sent for labs. CXR ordered; no post-procedure pneumothorax identified  EBL < 2 mL  Theresa Duty, NP 07/11/2020 11:06 AM

## 2020-07-11 NOTE — Progress Notes (Addendum)
PROGRESS NOTE    Fred Anderson  BZJ:[id number] DOB: 06/07/1945 DOA: 07/09/2020 PCP: Ezequiel Kayser, MD  Outpatient Specialists: [facility]    Brief Narrative:    Fred Anderson is a 75 y.o. male with medical history significant of NSCLC on chemotherapy, HTN, HLD, pre-DM, depression, BPH, melanoma, kidney stone, who presents with generalized weakness, confusion.  Per his wife, pt is currently doing chemotherapy, last dose of treatment was 3 weeks ago.  He developed generalized weakness in past several days, which has been progressively worsening.  Since this morning, patient has been confused.  He has poor appetite and decreased oral intake.  Patient has nausea, mostly dry heaves, no vomiting, abdominal pain or diarrhea.  Patient denies chest pain, cough, shortness of breath.  No symptoms of UTI.  No fever or chills.  When I saw patient in the emergency room, patient is mildly confused, but is still orientated x3.  He moves all extremities.  No facial droop or slurred speech.  Patient was seen by his oncologist, Dr. Rogue Bussing today, found to have low sodium, and sent to hospital for further evaluation and treatment.   ED Course: pt was found to have sodium 119, WBC 7.3, renal function okay, negative COVID PCR, temperature normal, blood pressure 149/82, heart rate 100, RR 23, oxygen saturation 94% on room air. 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 uosm 750, serum osm  253, u sodium 71 - this likely siadh due to pt's lung cancer. AM cortisol wnl. Started on hypertonic saline and sodium improved to 124 yesterday, 129 today. Patient says weakness has improved somewhat. Hypertonic saline now off - transfer to progressive - q8 sodium - consider salt tabs vs loop diuretic if fluid restriction not effective - 800 ml fluid restriction - seizure precautions  BPH with obstruction/lower urinary tract symptoms Acute urinary retention Bladder scan today >999 and pt unable to void, I/o cath yielded 1400 ml. No spinal cord lesion seen on MRI yesterday. 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  Currently on chemotherapy, last dose was 3 weeks ago. -Consulted Dr. Lynett Fish of oncology.  - Dr. B has involved palliative care  Sciatica MRI of spine shows extensive metastatic disease but no cord compression - monitor, consider outpt ortho f/u  Pleural effusion Likely malignant. Therapeutic thora ordered by onc performed today. - f/u fluid studies  Type 2 diabetes mellitus with neurologic complication, without long-term current use of insulin (HCC): glucose wnl -Sliding scale insulin  HTN (hypertension) Controlled -IV hydralazine as needed  Depression -Celexa    DVT prophylaxis: lovenox Code Status: partial - cpr but no intubation Family Communication: wife and daughter updated @ bedside 4/28  Level of care: Stepdown Status is: Inpatient  Remains inpatient appropriate because:Inpatient level of care appropriate due to severity of illness   Dispo: The patient is from: Home              Anticipated d/c is to: Home              Patient currently is not medically stable to d/c.    Difficult to place patient No   Consultants:  Oncology, nephrology  Procedures: none  Antimicrobials:  none    Subjective: This morning says breathing feels much better after thora. Has some appetite. Complains of constipation, did have small bm yesterday.   Objective: Vitals:   07/11/20 0600 07/11/20 0800 07/11/20 0948 07/11/20 1020  BP: (!) 150/87 110/86 (!) 144/76 (!) 116/103  Pulse: 96 (!) 118 99 (!) 101  Resp: 13 (!) 21 20   Temp:  98.1 F (36.7 C)    TempSrc:  Oral    SpO2: 91% 91% 96% 96%  Weight:      Height:        Intake/Output Summary (Last 24 hours) at 07/11/2020 1220 Last data filed at 07/11/2020 0830 Gross per 24 hour  Intake 974.74 ml  Output --  Net 974.74 ml   Filed Weights   07/09/20 1018 07/09/20 1711  Weight: 105.7 kg 104.6 kg    Examination:  General exam: Appears calm and comfortable  Respiratory system: rales at bases improved Cardiovascular system: S1 & S2 heard, RRR. No JVD, murmurs, rubs, gallops or clicks. No pedal edema. Gastrointestinal system: Abdomen is nondistended, soft and nontender. No organomegaly or masses felt. Normal bowel sounds heard. Central nervous system: Alert and oriented. No focal neurological deficits. Extremities: no edema Skin: No rashes, lesions or ulcers Psychiatry: Judgement and insight appear normal. Mood & affect appropriate.     Data Reviewed: I have personally reviewed following labs and imaging studies  CBC: Recent Labs  Lab 07/09/20 0821 07/10/20 0105  WBC 7.3 7.9  NEUTROABS 5.1  --   HGB 12.5* 11.3*  HCT 35.8* 32.1*  MCV 88.2 87.7  PLT 229 809   Basic Metabolic Panel: Recent Labs  Lab 07/09/20 1150 07/09/20 1515 07/09/20 1916 07/09/20 2129 07/10/20 0105 07/10/20 0335 07/10/20 0644 07/10/20 0928 07/10/20 1440 07/10/20 2028 07/11/20 0019 07/11/20 0521 07/11/20 0839  NA 115*   < > 118*   < > 119*   < > 124*   < > 125* 127* 128* 129* 129*  K 4.9  --  4.4  --  4.2  --  4.2  --    --   --   --  4.3  --   CL 82*  --  85*  --  87*  --  93*  --   --   --   --  97*  --   CO2 23  --  25  --  24  --  24  --   --   --   --  25  --   GLUCOSE 154*  --  139*  --  123*  --  111*  --   --   --   --  138*  --   BUN 16  --  13  --  12  --  10  --   --   --   --  15  --   CREATININE 0.55*  --  0.53*  --  0.41*  --  0.43*  --   --   --   --  0.63  --   CALCIUM 8.9  --  8.7*  --  8.6*  --  8.6*  --   --   --   --  8.9  --    < > = values in this interval not displayed.   GFR: Estimated Creatinine Clearance: 99.7 mL/min (by C-G formula based on SCr of 0.63 mg/dL). Liver Function Tests: Recent Labs  Lab 07/09/20 0821  AST 48*  ALT 18  ALKPHOS 249*  BILITOT 0.8  PROT 7.0  ALBUMIN 3.4*   No results for input(s): LIPASE, AMYLASE in the last 168 hours. No results for input(s): AMMONIA in the last 168 hours. Coagulation Profile: No results for input(s): INR, PROTIME in the last 168 hours. Cardiac Enzymes: No results for input(s): CKTOTAL, CKMB, CKMBINDEX, TROPONINI in the last 168 hours. BNP (last 3 results) No results for input(s): PROBNP in the last 8760 hours. HbA1C: No results for input(s): HGBA1C in the last 72 hours. CBG: Recent Labs  Lab 07/09/20 1713  GLUCAP 188*   Lipid Profile: No results for input(s): CHOL, HDL, LDLCALC, TRIG, CHOLHDL, LDLDIRECT in the last 72 hours. Thyroid Function Tests: No results for input(s): TSH, T4TOTAL, FREET4, T3FREE, THYROIDAB in the last 72 hours. Anemia Panel: No results for input(s): VITAMINB12, FOLATE, FERRITIN, TIBC, IRON, RETICCTPCT in the last 72 hours. Urine analysis:    Component Value Date/Time   COLORURINE YELLOW (A) 05/13/2020 1450   APPEARANCEUR CLEAR (A) 05/13/2020 1450   LABSPEC 1.027 05/13/2020 1450   PHURINE 5.0 05/13/2020 1450   GLUCOSEU NEGATIVE 05/13/2020 1450   HGBUR NEGATIVE 05/13/2020 1450   BILIRUBINUR NEGATIVE 05/13/2020 1450   KETONESUR NEGATIVE 05/13/2020 1450   PROTEINUR NEGATIVE 05/13/2020 1450    NITRITE NEGATIVE 05/13/2020 1450   LEUKOCYTESUR NEGATIVE 05/13/2020 1450   Sepsis Labs: @LABRCNTIP (procalcitonin:4,lacticidven:4)  ) Recent Results (from the past 240 hour(s))  Resp Panel by RT-PCR (Flu A&B, Covid) Nasopharyngeal Swab     Status: None   Collection Time: 07/09/20 11:03 AM   Specimen: Nasopharyngeal Swab; Nasopharyngeal(NP) swabs in vial transport medium  Result Value Ref Range Status   SARS Coronavirus 2 by RT PCR NEGATIVE NEGATIVE Final    Comment: (NOTE) SARS-CoV-2 target nucleic acids are NOT DETECTED.  The SARS-CoV-2 RNA is generally detectable in upper respiratory specimens during the acute phase of infection. The lowest concentration of SARS-CoV-2 viral copies this assay can detect is 138 copies/mL. A negative result does not preclude SARS-Cov-2 infection and should not be used as the sole basis for treatment or other patient management decisions. A negative result may occur with  improper specimen collection/handling, submission of specimen other than nasopharyngeal swab, presence of viral mutation(s) within the areas targeted by this assay, and inadequate number of viral copies(<138 copies/mL). A negative result must be combined with clinical observations, patient history, and epidemiological information. The expected result is Negative.  Fact Sheet for Patients:  EntrepreneurPulse.com.au  Fact Sheet for Healthcare Providers:  IncredibleEmployment.be  This test is no t yet approved or cleared by the [place] FDA and  has been authorized for detection and/or diagnosis of SARS-CoV-2 by FDA under an Emergency Use Authorization (EUA). This EUA will remain  in effect (meaning this test can be used) for the duration of the COVID-19 declaration under Section 564(b)(1) of the Act, 21 U.S.C.section 360bbb-3(b)(1), unless the authorization is terminated  or revoked sooner.       Influenza A by PCR NEGATIVE NEGATIVE  Final   Influenza B by PCR NEGATIVE  NEGATIVE Final    Comment: (NOTE) The Xpert Xpress SARS-CoV-2/FLU/RSV plus assay is intended as an aid in the diagnosis of influenza from Nasopharyngeal swab specimens and should not be used as a sole basis for treatment. Nasal washings and aspirates are unacceptable for Xpert Xpress SARS-CoV-2/FLU/RSV testing.  Fact Sheet for Patients: EntrepreneurPulse.com.au  Fact Sheet for Healthcare Providers: IncredibleEmployment.be  This test is not yet approved or cleared by the [place] FDA and has been authorized for detection and/or diagnosis of SARS-CoV-2 by FDA under an Emergency Use Authorization (EUA). This EUA will remain in effect (meaning this test can be used) for the duration of the COVID-19 declaration under Section 564(b)(1) of the Act, 21 U.S.C. section 360bbb-3(b)(1), unless the authorization is terminated or revoked.  Performed at [facility], [address] [place], [place] [address]   MRSA PCR Screening     Status: None   Collection Time: 07/09/20  5:25 PM   Specimen: Nasal Mucosa; Nasopharyngeal  Result Value Ref Range Status   MRSA by PCR NEGATIVE NEGATIVE Final    Comment:        The GeneXpert MRSA Assay (FDA approved for NASAL specimens only), is one component of a comprehensive MRSA colonization surveillance program. It is not intended to diagnose MRSA infection nor to guide or monitor treatment for MRSA infections. Performed at [facility], [address], [place], [place] [address]          Radiology Studies: CT HEAD WO CONTRAST  Result Date: 07/09/2020 CLINICAL DATA:  Initial evaluation for acute mental status change, unknown cause. EXAM: CT HEAD WITHOUT CONTRAST TECHNIQUE: Contiguous axial images were obtained from the base of the skull through the vertex without intravenous contrast. COMPARISON:  Previous MRI from 05/09/2020. FINDINGS: Brain:  Moderately advanced cerebral atrophy. Associated mild chronic microvascular ischemic disease. No acute intracranial hemorrhage. No acute large vessel territory infarct. No intraparenchymal mass lesion, mass effect, or midline shift. No hydrocephalus or extra-axial fluid collection. Vascular: No hyperdense vessel. Scattered vascular calcifications noted within the carotid siphons. Skull: Scalp soft tissues within normal limits. Calvarium intact. Small probable metastatic lesion involving the clivus is grossly similar to previous MRI. No other visible discrete osseous lesions. Sinuses/Orbits: Globes and orbital soft tissues demonstrate no acute finding. Paranasal sinuses are largely clear. No mastoid effusion. Other: None. IMPRESSION: 1. No acute intracranial abnormality. 2. Moderately advanced cerebral atrophy with mild chronic small vessel ischemic disease. 3. Small probable metastatic lesion involving the clivus, grossly similar to previous MRI. Electronically Signed   By: Jeannine Boga M.D.   On: 07/09/2020 22:33   MR THORACIC SPINE W WO CONTRAST  Result Date: 07/10/2020 CLINICAL DATA:  Metastatic non-small cell lung cancer. Back pain. History of back surgery as a teenager. EXAM: MRI THORACIC WITHOUT AND WITH CONTRAST TECHNIQUE: Multiplanar and multiecho pulse sequences of the thoracic spine were obtained without and with intravenous contrast. CONTRAST:  58mL GADAVIST GADOBUTROL 1 MMOL/ML IV SOLN COMPARISON:  CT chest 07/05/2020 FINDINGS: Alignment:  Normal Vertebrae: Chronic compression fracture T12 with posterior wire fixation. Widespread metastatic disease throughout the bone marrow of the thoracic spine with all thoracic levels involved. Diffuse rib involvement. No acute fracture. No epidural tumor. Cord:  Negative for cord compression.  Cord signal normal. Paraspinal and other soft tissues: Large right pleural effusion Disc levels: Negative for thoracic spinal stenosis. Small central disc  protrusion at T7-8. 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 No pathologic  fracture. No cord compression. Large right pleural effusion Chronic fracture T12 with posterior wire fixation. Electronically Signed   By: Franchot Gallo M.D.   On: 07/10/2020 14:41   MR Lumbar Spine W Wo Contrast  Result Date: 07/10/2020 CLINICAL DATA:  Non-small-cell lung cancer. Back pain. History of back surgery as a teenager. EXAM: MRI LUMBAR SPINE WITHOUT AND WITH CONTRAST TECHNIQUE: Multiplanar and multiecho pulse sequences of the lumbar spine were obtained without and with intravenous contrast. CONTRAST:  3mL GADAVIST GADOBUTROL 1 MMOL/ML IV SOLN COMPARISON:  Lumbar MRI 11/01/2009 FINDINGS: Segmentation:  Normal Alignment:  Mild retrolisthesis L2-3 Vertebrae: Widespread skeletal metastatic disease. Numerous small lesions are seen throughout the bone marrow of the lumbar spine, sacrum, and iliac bones. No pathologic fracture. Chronic fracture T12 with posterior wire fixation as noted on thoracic MRI. Conus medullaris and cauda equina: Conus extends to the L1-2 level. Conus and cauda equina appear normal. Paraspinal and other soft tissues: Negative for paraspinous mass or adenopathy. Right pleural effusion Disc levels: L1-2: Negative L2-3: Mild retrolisthesis. Diffuse disc bulging and spurring without stenosis. Interval resolution of right foraminal disc protrusion seen on the prior MRI L3-4: Disc degeneration with diffuse disc bulging and spurring. Bilateral facet degeneration. Mild subarticular stenosis bilaterally L4-5: Disc degeneration with diffuse disc bulging and bilateral facet hypertrophy. Mild to moderate spinal stenosis. Moderate subarticular stenosis and foraminal stenosis on the right. L5-S1: Disc bulging and spurring. Left foraminal disc and osteophyte and left foraminal encroachment unchanged from the prior MRI. IMPRESSION: Widespread  metastatic disease throughout the lumbar spine, sacrum, and iliac bones. No pathologic fracture. Lumbar degenerative changes. Electronically Signed   By: Franchot Gallo M.D.   On: 07/10/2020 14:46   DG Chest Port 1 View  Result Date: 07/11/2020 CLINICAL DATA:  Pleural effusion, status post right thoracentesis. EXAM: PORTABLE CHEST 1 VIEW COMPARISON:  07/09/2020 FINDINGS: Power injectable right Port-A-Cath tip: Right atrium. Reduced right pleural effusion with resolution of the blunting of the right lateral costophrenic angle. No visible pneumothorax. Atherosclerotic calcification of the aortic arch. Fatty bandlike opacity at the right lung base favoring mild residual atelectasis. IMPRESSION: 1. Substantially reduced right pleural effusion, status post right thoracentesis. No visible pneumothorax. Mild atelectasis at the right lung base. 2.  Aortic Atherosclerosis (ICD10-I70.0). Electronically Signed   By: Van Clines M.D.   On: 07/11/2020 11:05   US THORACENTESIS ASP PLEURAL SPACE W/IMG GUIDE  Result Date: 07/11/2020 INDICATION: Patient with a history of non-small cell lung cancer presents today with a right pleural effusion. Interventional radiology asked to perform a therapeutic and diagnostic thoracentesis. EXAM: ULTRASOUND GUIDED THORACENTESIS MEDICATIONS: 1% lidocaine 10 mL COMPLICATIONS: None immediate. PROCEDURE: An ultrasound guided thoracentesis was thoroughly discussed with the patient and questions answered. The benefits, risks, alternatives and complications were also discussed. The patient understands and wishes to proceed with the procedure. Written consent was obtained. Ultrasound was performed to localize and mark an adequate pocket of fluid in the right chest. The area was then prepped and draped in the normal sterile fashion. 1% Lidocaine was used for local anesthesia. Under ultrasound guidance a 6 Fr Safe-T-Centesis catheter was introduced. Thoracentesis was performed. The catheter  was removed and a dressing applied. FINDINGS: A total of approximately 2.5 L of amber-colored/blood-tinged fluid fluid was removed. Samples were sent to the laboratory as requested by the clinical team. IMPRESSION: Successful ultrasound guided right thoracentesis yielding 2.5 L of pleural fluid. Read by: Soyla Dryer, NP Electronically Signed   By: Eldridge Abrahams.D.  On: 07/11/2020 11:08        Scheduled Meds: . aspirin  325 mg Oral Daily  . baclofen  20 mg Oral TID  . Chlorhexidine Gluconate Cloth  6 each Topical Q2200  . citalopram  20 mg Oral Daily  . enoxaparin (LOVENOX) injection  0.5 mg/kg Subcutaneous Q24H  . feeding supplement  237 mL Oral BID BM  . folic acid  1 mg Oral Daily  . gabapentin  300 mg Oral TID  . mometasone-formoterol  2 puff Inhalation BID  . multivitamin with minerals  1 tablet Oral Daily  . pravastatin  40 mg Oral q1800  . senna-docusate  1 tablet Oral QHS  . tamsulosin  0.4 mg Oral Daily   Continuous Infusions:    LOS: 2 days    Time spent: 30 min    Desma Maxim, MD [facility]   If 7PM-7AM, please contact night-coverage [email or web address] Password [facility] 07/11/2020, 12:20 PM

## 2020-07-11 NOTE — Progress Notes (Signed)
Patient traveled off unit to ultrasound for planned R thoracentesis procedure in bed, with transporter. Per Dr. Si Raider, patient able to travel without RN. Patient VSS, on RA. No complaints at this time.

## 2020-07-11 NOTE — Progress Notes (Signed)
[facility] Date of Admission:  07/09/2020           Reason for Consult:  hyponatremia   Referring Provider: Gwynne Edinger, MD Primary Care Provider: Ezequiel Kayser, MD   History of Presenting Illness:  Fred Anderson is a 75 y.o. 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  Currently undergoing chemotherapy with  carbo-alimta-keytruda and is followed by Dr. Rogue Bussing at the cancer center.  Presented to the ER because of feeling weak, decreased oral intake and some dyspnea on exertion.   Found to have severe hyponatremia  Today, he feels better He is able to tolerate diet without nausea or vomiting although appetite is poor and he is eating very little Sitting up in chair when seen   OBJECTIVE: Blood pressure (!) 150/87, pulse 96, temperature 98.1 F (36.7 C), temperature source Oral, resp. rate 13, height 5\' 11"  (1.803 m), weight 104.6 kg, SpO2 91 %.    Physical Exam: General:  No acute distress,    HEENT  anicteric, moist oral mucous membrane  Pulm/lungs  normal breathing effort,    CVS/Heart  regular rhythm, no rub or gallop  Abdomen:   Soft, nontender, obese  Extremities:  No peripheral edema  Neurologic:  Alert, oriented, able to follow commands  Skin:  No acute rashes     Lab Results Lab Results  Component Value Date   WBC 7.9 07/10/2020   HGB 11.3 (L) 07/10/2020   HCT 32.1 (L) 07/10/2020   MCV 87.7 07/10/2020   PLT 191 07/10/2020    Lab Results  Component Value Date   CREATININE 0.63 07/11/2020   BUN 15 07/11/2020   NA 129 (L) 07/11/2020   K 4.3 07/11/2020   CL 97 (L) 07/11/2020   CO2 25 07/11/2020    Lab Results  Component Value Date   ALT 18 07/09/2020   AST 48 (H) 07/09/2020   ALKPHOS 249 (H) 07/09/2020   BILITOT 0.8 07/09/2020     Microbiology: Recent Results (from the past 240 hour(s))  Resp Panel by RT-PCR (Flu A&B, Covid)  Nasopharyngeal Swab     Status: None   Collection Time: 07/09/20 11:03 AM   Specimen: Nasopharyngeal Swab; Nasopharyngeal(NP) swabs in vial transport medium  Result Value Ref Range Status   SARS Coronavirus 2 by RT PCR NEGATIVE NEGATIVE Final    Comment: (NOTE) SARS-CoV-2 target nucleic acids are NOT DETECTED.  The SARS-CoV-2 RNA is generally detectable in upper respiratory specimens during the acute phase of infection. The lowest concentration of SARS-CoV-2 viral copies this assay can detect is 138 copies/mL. A negative result does not preclude SARS-Cov-2 infection and should not be used as the sole basis for treatment or other patient management decisions. A negative result may occur with  improper specimen collection/handling, submission of specimen other than nasopharyngeal swab, presence of viral mutation(s) within the areas targeted by this assay, and inadequate number of viral copies(<138 copies/mL). A negative result must be combined with clinical observations, patient history, and epidemiological information. The expected result is Negative.  Fact Sheet for Patients:  EntrepreneurPulse.com.au  Fact Sheet for Healthcare Providers:  IncredibleEmployment.be  This test is no t yet approved or cleared by the [place] FDA and  has been authorized for detection and/or diagnosis of SARS-CoV-2 by FDA under an Emergency Use Authorization (EUA). This EUA will remain  in effect (meaning this test can be used) for  the duration of the COVID-19 declaration under Section 564(b)(1) of the Act, 21 U.S.C.section 360bbb-3(b)(1), unless the authorization is terminated  or revoked sooner.       Influenza A by PCR NEGATIVE NEGATIVE Final   Influenza B by PCR NEGATIVE NEGATIVE Final    Comment: (NOTE) The Xpert Xpress SARS-CoV-2/FLU/RSV plus assay is intended as an aid in the diagnosis of influenza from Nasopharyngeal swab specimens and should not be  used as a sole basis for treatment. Nasal washings and aspirates are unacceptable for Xpert Xpress SARS-CoV-2/FLU/RSV testing.  Fact Sheet for Patients: EntrepreneurPulse.com.au  Fact Sheet for Healthcare Providers: IncredibleEmployment.be  This test is not yet approved or cleared by the [place] FDA and has been authorized for detection and/or diagnosis of SARS-CoV-2 by FDA under an Emergency Use Authorization (EUA). This EUA will remain in effect (meaning this test can be used) for the duration of the COVID-19 declaration under Section 564(b)(1) of the Act, 21 U.S.C. section 360bbb-3(b)(1), unless the authorization is terminated or revoked.  Performed at [facility], [address] [place], [place] [address]   MRSA PCR Screening     Status: None   Collection Time: 07/09/20  5:25 PM   Specimen: Nasal Mucosa; Nasopharyngeal  Result Value Ref Range Status   MRSA by PCR NEGATIVE NEGATIVE Final    Comment:        The GeneXpert MRSA Assay (FDA approved for NASAL specimens only), is one component of a comprehensive MRSA colonization surveillance program. It is not intended to diagnose MRSA infection nor to guide or monitor treatment for MRSA infections. Performed at [facility], [address] [place], [place] [address]     Medications: Scheduled Meds: . aspirin  325 mg Oral Daily  . baclofen  20 mg Oral TID  . Chlorhexidine Gluconate Cloth  6 each Topical Q2200  . citalopram  20 mg Oral Daily  . enoxaparin (LOVENOX) injection  0.5 mg/kg Subcutaneous Q24H  . feeding supplement  237 mL Oral BID BM  . folic acid  1 mg Oral Daily  . gabapentin  300 mg Oral TID  . mometasone-formoterol  2 puff Inhalation BID  . multivitamin with minerals  1 tablet Oral Daily  . pravastatin  40 mg Oral q1800  . senna-docusate  1 tablet Oral QHS  . tamsulosin  0.4 mg Oral Daily   Continuous Infusions:  PRN  Meds:.acetaminophen, albuterol, bisacodyl, hydrALAZINE, lidocaine-prilocaine, ondansetron (ZOFRAN) IV, oxyCODONE, polyethylene glycol  Allergies  Allergen Reactions  . Atorvastatin Other (See Comments)    Muscle pain.    Urinalysis: No results for input(s): COLORURINE, LABSPEC, PHURINE, GLUCOSEU, HGBUR, BILIRUBINUR, KETONESUR, PROTEINUR, UROBILINOGEN, NITRITE, LEUKOCYTESUR in the last 72 hours.  Invalid input(s): APPERANCEUR    Imaging: CT HEAD WO CONTRAST  Result Date: 07/09/2020 CLINICAL DATA:  Initial evaluation for acute mental status change, unknown cause. EXAM: CT HEAD WITHOUT CONTRAST TECHNIQUE: Contiguous axial images were obtained from the base of the skull through the vertex without intravenous contrast. COMPARISON:  Previous MRI from 05/09/2020. FINDINGS: Brain: Moderately advanced cerebral atrophy. Associated mild chronic microvascular ischemic disease. No acute intracranial hemorrhage. No acute large vessel territory infarct. No intraparenchymal mass lesion, mass effect, or midline shift. No hydrocephalus or extra-axial fluid collection. Vascular: No hyperdense vessel. Scattered vascular calcifications noted within the carotid siphons. Skull: Scalp soft tissues within normal limits. Calvarium intact. Small probable metastatic lesion involving the clivus is grossly similar to previous MRI. No other visible discrete osseous lesions. Sinuses/Orbits: Globes  and orbital soft tissues demonstrate no acute finding. Paranasal sinuses are largely clear. No mastoid effusion. Other: None. IMPRESSION: 1. No acute intracranial abnormality. 2. Moderately advanced cerebral atrophy with mild chronic small vessel ischemic disease. 3. Small probable metastatic lesion involving the clivus, grossly similar to previous MRI. Electronically Signed   By: Jeannine Boga M.D.   On: 07/09/2020 22:33   MR THORACIC SPINE W WO CONTRAST  Result Date: 07/10/2020 CLINICAL DATA:  Metastatic non-small cell lung  cancer. Back pain. History of back surgery as a teenager. EXAM: MRI THORACIC WITHOUT AND WITH CONTRAST TECHNIQUE: Multiplanar and multiecho pulse sequences of the thoracic spine were obtained without and with intravenous contrast. CONTRAST:  55mL GADAVIST GADOBUTROL 1 MMOL/ML IV SOLN COMPARISON:  CT chest 07/05/2020 FINDINGS: Alignment:  Normal Vertebrae: Chronic compression fracture T12 with posterior wire fixation. Widespread metastatic disease throughout the bone marrow of the thoracic spine with all thoracic levels involved. Diffuse rib involvement. No acute fracture. No epidural tumor. Cord:  Negative for cord compression.  Cord signal normal. Paraspinal and other soft tissues: Large right pleural effusion Disc levels: Negative for thoracic spinal stenosis. Small central disc protrusion at T7-8. 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 No pathologic fracture. No cord compression. Large right pleural effusion Chronic fracture T12 with posterior wire fixation. Electronically Signed   By: Franchot Gallo M.D.   On: 07/10/2020 14:41   MR Lumbar Spine W Wo Contrast  Result Date: 07/10/2020 CLINICAL DATA:  Non-small-cell lung cancer. Back pain. History of back surgery as a teenager. EXAM: MRI LUMBAR SPINE WITHOUT AND WITH CONTRAST TECHNIQUE: Multiplanar and multiecho pulse sequences of the lumbar spine were obtained without and with intravenous contrast. CONTRAST:  33mL GADAVIST GADOBUTROL 1 MMOL/ML IV SOLN COMPARISON:  Lumbar MRI 11/01/2009 FINDINGS: Segmentation:  Normal Alignment:  Mild retrolisthesis L2-3 Vertebrae: Widespread skeletal metastatic disease. Numerous small lesions are seen throughout the bone marrow of the lumbar spine, sacrum, and iliac bones. No pathologic fracture. Chronic fracture T12 with posterior wire fixation as noted on thoracic MRI. Conus medullaris and cauda equina: Conus extends to the L1-2 level. Conus  and cauda equina appear normal. Paraspinal and other soft tissues: Negative for paraspinous mass or adenopathy. Right pleural effusion Disc levels: L1-2: Negative L2-3: Mild retrolisthesis. Diffuse disc bulging and spurring without stenosis. Interval resolution of right foraminal disc protrusion seen on the prior MRI L3-4: Disc degeneration with diffuse disc bulging and spurring. Bilateral facet degeneration. Mild subarticular stenosis bilaterally L4-5: Disc degeneration with diffuse disc bulging and bilateral facet hypertrophy. Mild to moderate spinal stenosis. Moderate subarticular stenosis and foraminal stenosis on the right. L5-S1: Disc bulging and spurring. Left foraminal disc and osteophyte and left foraminal encroachment unchanged from the prior MRI. 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 No pathologic fracture. Lumbar degenerative changes. Electronically Signed   By: Franchot Gallo M.D.   On: 07/10/2020 14:46   DG Chest Portable 1 View  Result Date: 07/09/2020 CLINICAL DATA:  Chemotherapy for lung cancer. EXAM: PORTABLE CHEST 1 VIEW COMPARISON:  05/06/2020 FINDINGS: Power port on the right has its tip at the SVC RA junction. The left chest is clear. There is a sub pulmonic effusion on the right and a small amount of fluid in the fissure. Volume loss in the right lower lung. 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 IMPRESSION: 1. Subpulmonic effusion on the right with some atelectasis of the right lower lung.  2. 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 Electronically Signed   By: Nelson Chimes M.D.   On: 07/09/2020 11:31      Assessment/Plan:  Fred Anderson is a 75 y.o. 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

## 2020-07-11 NOTE — Consult Note (Signed)
[place]  Telephone:([phone] [phone]   Name: Fred Anderson Date: 07/11/2020 MRN: [id number]  DOB: 1946-01-30  Patient Care Team: Ezequiel Kayser, MD as PCP - General (Internal Medicine) Telford Nab, RN as Oncology Nurse Navigator    REASON FOR CONSULTATION: Fred Anderson is a 75 y.o. male with multiple medical problems including stage IV non-small cell lung cancer diagnosed in February 2022 on systemic treatment with carbo/pemetrexed/pembrolizumab.  Patient was admitted to hospital 07/09/2020 with progressive weakness and altered mental status.  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  Palliative care was consulted help address goals.  SOCIAL HISTORY:     reports that he quit smoking about 31 years ago. He has never used smokeless tobacco. He reports current alcohol use of about 12.0 standard drinks of alcohol per week. He reports that he does not use drugs.  Patient is married to his wife of 31 years.  He has 7 children.  Patient worked in [employer].  ADVANCE DIRECTIVES:  None on file  CODE STATUS: DNR  PAST MEDICAL HISTORY: Past Medical History:  Diagnosis Date  . Arthritis   . Blood in semen   . BPH (benign prostatic hypertrophy)   . Cancer ([place])    melanoma on back  . DDD (degenerative disc disease), lumbar   . Diabetes mellitus without complication ([place])    TYPE 2  . Erectile dysfunction   . Hematospermia   . Hemorrhoids   . History of adenomatous polyp of colon   . History of kidney stones   . Hyperlipidemia   . Hypertension   . Kidney stones   . Obesity   . 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[place])   . Prediabetes     PAST SURGICAL HISTORY:  Past Surgical History:  Procedure Laterality Date  . BACK SURGERY  age 21   Broken back  . COLONOSCOPY WITH PROPOFOL N/A 05/05/2017   Procedure: COLONOSCOPY WITH PROPOFOL;  Surgeon: Toledo, Benay Pike, MD;   Location: [facility] ENDOSCOPY;  Service: Gastroenterology;  Laterality: N/A;  . HEMORRHOID SURGERY    . IR IMAGING GUIDED PORT INSERTION  05/17/2020  . LAPAROSCOPIC APPENDECTOMY N/A 04/25/2018   Procedure: APPENDECTOMY LAPAROSCOPIC;  Surgeon: Herbert Pun, MD;  Location: [facility] ORS;  Service: General;  Laterality: N/A;  . SHOULDER ARTHROSCOPY WITH OPEN ROTATOR CUFF REPAIR Right 04/03/2019   Procedure: SHOULDER ARTHROSCOPY WITH SUBSACAPULARIS REPAIR, OPEN ROTATOR CUFF REPAIR, SUBARCOMIAL DECOMPRESSION, BICEPS TENODESIS;  Surgeon: Leim Fabry, MD;  Location: [facility] ORS;  Service: Orthopedics;  Laterality: Right;  . SMALL INTESTINE SURGERY      HEMATOLOGY/ONCOLOGY HISTORY:  Oncology History Overview Note  May 07, 2020- CT chest [ER]..Centrally obstructing right upper lobe masses, right upper lobe nodular consolidation, large right pleural effusion and extensive pleural/extrapleural nodularity and mediastinal/right hilar adenopathy, findings most indicative of stage IV primary bronchogenic carcinoma.s/p RIGHT Thoracentesis- MALIGNANT CELLS PRESENT.  - METASTATIC NON SMALL CELL CARCINOMA, FAVOR ADENOCARCINOMA. FEB- 2022- MRI Brain NEG for parenchymal metastases; cervical bone met. PET-MARCH 2022-pleural-based metastases; pleural effusion question bone mets.    # MARCH 10th, 2022- [place]-Alimat [awaiting NGS]; # April 4th, 2022 cycles- carbo-alimta-keytruda  # Enlarged pulmonary arteries, indicative of pulmonary arterial Hypertension.  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; [place]]  # NGS/MOLECULAR TESTS:Guardant testing- ATM *; No other targetable mutation    # PALLIATIVE CARE EVALUATION:  # PAIN MANAGEMENT:    DIAGNOSIS: Lung cancer  STAGE:  IV       ;  GOALS: palliative  CURRENT/MOST RECENT THERAPY : carbo-alimta-keytruda    Cancer of upper lobe of right lung ([place])  05/13/2020 Initial Diagnosis   Cancer of upper lobe of right lung ([place])   05/13/2020 Cancer Staging   Staging form:  Lung, AJCC 8th Edition - Clinical: Stage IVA (cT2, cN3, pM1b) - Signed by Cammie Sickle, MD on 05/13/2020 Histopathologic type: Adenocarcinoma, NOS   05/23/2020 -  Chemotherapy    Patient is on Treatment Plan: LUNG CARBOPLATIN / PEMETREXED / PEMBROLIZUMAB Q21D INDUCTION X 4 CYCLES / MAINTENANCE PEMETREXED + PEMBROLIZUMAB        ALLERGIES:  is allergic to atorvastatin.  MEDICATIONS:  Current Facility-Administered Medications  Medication Dose Route Frequency Provider Last Rate Last Admin  . acetaminophen (TYLENOL) tablet 650 mg  650 mg Oral Q6H PRN Ivor Costa, MD      . albuterol (VENTOLIN HFA) 108 (90 Base) MCG/ACT inhaler 2 puff  2 puff Inhalation Q6H PRN Ivor Costa, MD      . aspirin tablet 325 mg  325 mg Oral Daily Ivor Costa, MD   325 mg at 07/11/20 1134  . baclofen (LIORESAL) tablet 20 mg  20 mg Oral TID Ivor Costa, MD   20 mg at 07/11/20 1134  . bisacodyl (DULCOLAX) EC tablet 5 mg  5 mg Oral Daily PRN Opyd, Ilene Qua, MD   5 mg at 07/11/20 0338  . Chlorhexidine Gluconate Cloth 2 % PADS 6 each  6 each Topical Q2200 Ivor Costa, MD   6 each at 07/10/20 2147  . citalopram (CELEXA) tablet 20 mg  20 mg Oral Daily Ivor Costa, MD   20 mg at 07/11/20 1138  . enoxaparin (LOVENOX) injection 52.5 mg  0.5 mg/kg Subcutaneous Q24H Ivor Costa, MD   52.5 mg at 07/10/20 2146  . feeding supplement (ENSURE ENLIVE / ENSURE PLUS) liquid 237 mL  237 mL Oral BID BM Wouk, Ailene Rud, MD      . folic acid (FOLVITE) tablet 1 mg  1 mg Oral Daily Ivor Costa, MD   1 mg at 07/11/20 1138  . gabapentin (NEURONTIN) capsule 300 mg  300 mg Oral TID Ivor Costa, MD   300 mg at 07/11/20 1135  . Glycerin (Adult) 2 g suppository 1 suppository  1 suppository Rectal Daily PRN Wouk, Ailene Rud, MD      . lidocaine-prilocaine (EMLA) cream 1 application  1 application Topical PRN Ivor Costa, MD      . mometasone-formoterol [facility]) 200-5 MCG/ACT inhaler 2 puff  2 puff Inhalation BID Ivor Costa, MD   2 puff at 07/11/20  [phone]  . multivitamin with minerals tablet 1 tablet  1 tablet Oral Daily Ivor Costa, MD   1 tablet at 07/11/20 1134  . ondansetron (ZOFRAN) injection 4 mg  4 mg Intravenous Q8H PRN Ivor Costa, MD      . oxyCODONE (Oxy IR/ROXICODONE) immediate release tablet 5 mg  5 mg Oral Q8H PRN Ivor Costa, MD   5 mg at 07/11/20 0338  . [START ON 07/12/2020] polyethylene glycol (MIRALAX / GLYCOLAX) packet 17 g  17 g Oral Daily Wouk, Ailene Rud, MD      . pravastatin (PRAVACHOL) tablet 40 mg  40 mg Oral q1800 Ivor Costa, MD   40 mg at 07/10/20 1617  . senna-docusate (Senokot-S) tablet 1 tablet  1 tablet Oral QHS Opyd, Ilene Qua, MD   1 tablet at 07/10/20 2146  . tamsulosin (FLOMAX) capsule 0.4 mg  0.4 mg Oral Daily Ivor Costa,  MD   0.4 mg at 07/11/20 1135    VITAL SIGNS: BP 128/80   Pulse 100   Temp 98.1 F (36.7 C) (Oral)   Resp 16   Ht 5' 11" (1.803 m)   Wt 230 lb 9.6 oz (104.6 kg)   SpO2 93%   BMI 32.16 kg/m  Filed Weights   07/09/20 1018 07/09/20 1711  Weight: 233 lb (105.7 kg) 230 lb 9.6 oz (104.6 kg)    Estimated body mass index is 32.16 kg/m as calculated from the following:   Height as of this encounter: 5' 11" (1.803 m).   Weight as of this encounter: 230 lb 9.6 oz (104.6 kg).  LABS: CBC:    Component Value Date/Time   WBC 7.9 07/10/2020 0105   HGB 11.3 (L) 07/10/2020 0105   HCT 32.1 (L) 07/10/2020 0105   PLT 191 07/10/2020 0105   MCV 87.7 07/10/2020 0105   NEUTROABS 5.1 07/09/2020 0821   LYMPHSABS 1.1 07/09/2020 0821   MONOABS 0.9 07/09/2020 0821   EOSABS 0.0 07/09/2020 0821   BASOSABS 0.0 07/09/2020 0821   Comprehensive Metabolic Panel:    Component Value Date/Time   NA 130 (L) 07/11/2020 1323   K 4.3 07/11/2020 0521   CL 97 (L) 07/11/2020 0521   CO2 25 07/11/2020 0521   BUN 15 07/11/2020 0521   CREATININE 0.63 07/11/2020 0521   GLUCOSE 138 (H) 07/11/2020 0521   CALCIUM 8.9 07/11/2020 0521   AST 48 (H) 07/09/2020 0821   ALT 18 07/09/2020 0821   ALKPHOS 249 (H)  07/09/2020 0821   BILITOT 0.8 07/09/2020 0821   PROT 7.0 07/09/2020 0821   ALBUMIN 3.4 (L) 07/09/2020 0821    RADIOGRAPHIC STUDIES: CT HEAD WO CONTRAST  Result Date: 07/09/2020 CLINICAL DATA:  Initial evaluation for acute mental status change, unknown cause. EXAM: CT HEAD WITHOUT CONTRAST TECHNIQUE: Contiguous axial images were obtained from the base of the skull through the vertex without intravenous contrast. COMPARISON:  Previous MRI from 05/09/2020. FINDINGS: Brain: Moderately advanced cerebral atrophy. Associated mild chronic microvascular ischemic disease. No acute intracranial hemorrhage. No acute large vessel territory infarct. No intraparenchymal mass lesion, mass effect, or midline shift. No hydrocephalus or extra-axial fluid collection. Vascular: No hyperdense vessel. Scattered vascular calcifications noted within the carotid siphons. Skull: Scalp soft tissues within normal limits. Calvarium intact. Small probable metastatic lesion involving the clivus is grossly similar to previous MRI. No other visible discrete osseous lesions. Sinuses/Orbits: Globes and orbital soft tissues demonstrate no acute finding. Paranasal sinuses are largely clear. No mastoid effusion. Other: None. IMPRESSION: 1. No acute intracranial abnormality. 2. Moderately advanced cerebral atrophy with mild chronic small vessel ischemic disease. 3. Small probable metastatic lesion involving the clivus, grossly similar to previous MRI. Electronically Signed   By: Jeannine Boga M.D.   On: 07/09/2020 22:33   CT Chest W Contrast  Result Date: 07/06/2020 CLINICAL DATA:  Follow-up metastatic right lung non-small cell carcinoma. Currently undergoing chemotherapy. EXAM: CT CHEST WITH CONTRAST TECHNIQUE: Multidetector CT imaging of the chest was performed during intravenous contrast administration. CONTRAST:  52m OMNIPAQUE IOHEXOL 300 MG/ML  SOLN COMPARISON:  PET-CT on 05/15/2019 FINDINGS: Cardiovascular: No acute findings.  Aortic and coronary atherosclerotic calcification noted. Mediastinum/Nodes: Mediastinal lymphadenopathy in right paratracheal region remains stable measuring 2.2 cm short axis. Right hilar lymphadenopathy also shows no significant change measuring 2.5 cm. No new or increased sites of lymphadenopathy identified. Lungs/Pleura: Irregular masslike opacity in the central right upper lobe shows no significant change, measuring  4.4 x 2.7 cm on image 59/2. An irregular pulmonary nodule in the anterior right upper lobe shows mild decrease in size, currently measuring 2.0 x 1.8 cm on image 43/3 compared to 2.3 x 2.2 cm previously. Several sub-cm adjacent sub pleural nodules in the anterior right middle lobe remains stable. Focal opacity in the anterior right upper lobe is unchanged and consistent with postobstructive atelectasis or pneumonitis. Moderate to large right pleural effusion shows mild increase in size since previous study. Diffuse pleural based soft tissue nodularity shows no significant change and indicates a malignant pleural effusion. Upper Abdomen:  Unremarkable. Musculoskeletal:  No suspicious bone lesions. IMPRESSION: Slight decrease in size of anterior right upper lobe pulmonary nodule. Stable masslike opacity in central right upper lobe, and stable right hilar and right paratracheal lymphadenopathy. Stable opacity in medial right upper lobe, likely due to postobstructive atelectasis or pneumonitis. Mild increase in size of malignant right pleural effusion. Aortic Atherosclerosis (ICD10-I70.0). Electronically Signed   By: Marlaine Hind M.D.   On: 07/06/2020 16:51   MR THORACIC SPINE W WO CONTRAST  Result Date: 07/10/2020 CLINICAL DATA:  Metastatic non-small cell lung cancer. Back pain. History of back surgery as a teenager. EXAM: MRI THORACIC WITHOUT AND WITH CONTRAST TECHNIQUE: Multiplanar and multiecho pulse sequences of the thoracic spine were obtained without and with intravenous contrast. CONTRAST:   21m GADAVIST GADOBUTROL 1 MMOL/ML IV SOLN COMPARISON:  CT chest 07/05/2020 FINDINGS: Alignment:  Normal Vertebrae: Chronic compression fracture T12 with posterior wire fixation. Widespread metastatic disease throughout the bone marrow of the thoracic spine with all thoracic levels involved. Diffuse rib involvement. No acute fracture. No epidural tumor. Cord:  Negative for cord compression.  Cord signal normal. Paraspinal and other soft tissues: Large right pleural effusion Disc levels: Negative for thoracic spinal stenosis. Small central disc protrusion at T7-8. 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 No pathologic fracture. No cord compression. Large right pleural effusion Chronic fracture T12 with posterior wire fixation. Electronically Signed   By: CFranchot GalloM.D.   On: 07/10/2020 14:41   MR Lumbar Spine W Wo Contrast  Result Date: 07/10/2020 CLINICAL DATA:  Non-small-cell lung cancer. Back pain. History of back surgery as a teenager. EXAM: MRI LUMBAR SPINE WITHOUT AND WITH CONTRAST TECHNIQUE: Multiplanar and multiecho pulse sequences of the lumbar spine were obtained without and with intravenous contrast. CONTRAST:  126mGADAVIST GADOBUTROL 1 MMOL/ML IV SOLN COMPARISON:  Lumbar MRI 11/01/2009 FINDINGS: Segmentation:  Normal Alignment:  Mild retrolisthesis L2-3 Vertebrae: Widespread skeletal metastatic disease. Numerous small lesions are seen throughout the bone marrow of the lumbar spine, sacrum, and iliac bones. No pathologic fracture. Chronic fracture T12 with posterior wire fixation as noted on thoracic MRI. Conus medullaris and cauda equina: Conus extends to the L1-2 level. Conus and cauda equina appear normal. Paraspinal and other soft tissues: Negative for paraspinous mass or adenopathy. Right pleural effusion Disc levels: L1-2: Negative L2-3: Mild retrolisthesis. Diffuse disc bulging and spurring without stenosis.  Interval resolution of right foraminal disc protrusion seen on the prior MRI L3-4: Disc degeneration with diffuse disc bulging and spurring. Bilateral facet degeneration. Mild subarticular stenosis bilaterally L4-5: Disc degeneration with diffuse disc bulging and bilateral facet hypertrophy. Mild to moderate spinal stenosis. Moderate subarticular stenosis and foraminal stenosis on the right. L5-S1: Disc bulging and spurring. Left foraminal disc and osteophyte and left foraminal encroachment unchanged from the prior MRI. IMPRESSION: Widespread metastatic disease throughout the lumbar spine, sacrum,  and iliac bones. No pathologic fracture. Lumbar degenerative changes. Electronically Signed   By: Franchot Gallo M.D.   On: 07/10/2020 14:46   DG Chest Port 1 View  Result Date: 07/11/2020 CLINICAL DATA:  Pleural effusion, status post right thoracentesis. EXAM: PORTABLE CHEST 1 VIEW COMPARISON:  07/09/2020 FINDINGS: Power injectable right Port-A-Cath tip: Right atrium. Reduced right pleural effusion with resolution of the blunting of the right lateral costophrenic angle. No visible pneumothorax. Atherosclerotic calcification of the aortic arch. Fatty bandlike opacity at the right lung base favoring mild residual atelectasis. IMPRESSION: 1. Substantially reduced right pleural effusion, status post right thoracentesis. No visible pneumothorax. Mild atelectasis at the right lung base. 2.  Aortic Atherosclerosis (ICD10-I70.0). Electronically Signed   By: Van Clines M.D.   On: 07/11/2020 11:05   DG Chest Portable 1 View  Result Date: 07/09/2020 CLINICAL DATA:  Chemotherapy for lung cancer. EXAM: PORTABLE CHEST 1 VIEW COMPARISON:  05/06/2020 FINDINGS: Power port on the right has its tip at the SVC RA junction. The left chest is clear. There is a sub pulmonic effusion on the right and a small amount of fluid in the fissure. Volume loss in the right lower lung. 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  IMPRESSION: 1. Subpulmonic effusion on the right with some atelectasis of the right lower lung. 2. 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 Electronically Signed   By: Nelson Chimes M.D.   On: 07/09/2020 11:31   DG HIP UNILAT WITH PELVIS 2-3 VIEWS RIGHT  Result Date: 07/03/2020 CLINICAL DATA:  Right hip and upper leg pain since 06/28/2020. History of lung cancer. No known injury. EXAM: DG HIP (WITH OR WITHOUT PELVIS) 2-3V RIGHT COMPARISON:  None. FINDINGS: There is no evidence of hip fracture or dislocation. Mild degenerative change about the hips appear symmetric from right to left. Lower lumbar spondylosis is incompletely evaluated. Soft tissues are negative. IMPRESSION: No acute abnormality. Mild appearing bilateral hip osteoarthritis. Lower lumbar degenerative change also noted. Electronically Signed   By: Inge Rise M.D.   On: 07/03/2020 16:41   DG FEMUR, MIN 2 VIEWS RIGHT  Result Date: 07/03/2020 CLINICAL DATA:  Right hip and upper leg pain since 06/28/2020. History of lung cancer. No known injury. EXAM: RIGHT FEMUR 2 VIEWS COMPARISON:  None. FINDINGS: There is no evidence of fracture or other focal bone lesions. Soft tissues are unremarkable. IMPRESSION: Negative exam. Electronically Signed   By: Inge Rise M.D.   On: 07/03/2020 16:43   US THORACENTESIS ASP PLEURAL SPACE W/IMG GUIDE  Result Date: 07/11/2020 INDICATION: Patient with a history of non-small cell lung cancer presents today with a right pleural effusion. Interventional radiology asked to perform a therapeutic and diagnostic thoracentesis. EXAM: ULTRASOUND GUIDED THORACENTESIS MEDICATIONS: 1% lidocaine 10 mL COMPLICATIONS: None immediate. PROCEDURE: An ultrasound guided thoracentesis was thoroughly discussed with the patient and questions answered. The benefits, risks, alternatives and complications were also discussed. The patient understands and wishes to proceed with the procedure. Written consent was obtained.  Ultrasound was performed to localize and mark an adequate pocket of fluid in the right chest. The area was then prepped and draped in the normal sterile fashion. 1% Lidocaine was used for local anesthesia. Under ultrasound guidance a 6 Fr Safe-T-Centesis catheter was introduced. Thoracentesis was performed. The catheter was removed and a dressing applied. FINDINGS: A total of approximately 2.5 L of amber-colored/blood-tinged fluid fluid was removed. Samples were sent to the laboratory as requested by the clinical team. IMPRESSION: Successful ultrasound guided right  thoracentesis yielding 2.5 L of pleural fluid. Read by: Soyla Dryer, NP Electronically Signed   By: Sandi Mariscal M.D.   On: 07/11/2020 11:08    PERFORMANCE STATUS (ECOG) : 2 - Symptomatic, <50% confined to bed  Review of Systems Unless otherwise noted, a complete review of systems is negative.  Physical Exam General: NAD Pulmonary: Unlabored Extremities: no edema, no joint deformities Skin: no rashes Neurological: Weakness but otherwise nonfocal  IMPRESSION: Patient seen earlier in the day but visit was brief as patient was having suprapubic pain with urinary retention, which required in and out catheterization.  I returned this afternoon and the patient was sleeping and family requested that I not wake him.  I met privately with patient's wife and daughter.  Together, we reviewed their goals.  Patient was diagnosed with stage IV lung cancer in February and was started on systemic chemotherapy.  His daughter says that she does not think that patient wanted to start treatment but did so begrudgingly at the request of family.  Patient has had multiple relatives die of cancer.  Family say that patient has been actively discussing his end-of-life today.  His wife asked about comfort and hospice care.  His daughter volunteered that she thought patient would likely opt to forego further cancer treatment.  We did briefly discuss the option  of hospice care.  However, no decisions were made today and family want to speak with patient more in depth regarding his wishes.  We discussed CODE STATUS.  Patient's wife says that she does not think that he would want to be resuscitated.  She says earlier in the hospitalization, she encouraged patient to accept CPR but no longer feels like that would be the right decision.  Family plans to speak with him regarding his wishes and will let the nurse know if they want to change the CODE STATUS.  Symptomatically, patient has been fairly comfortable today.  He has recently had severe pain extending down his right leg.  Work-up revealed extensive metastatic disease in the lumbar and thoracic spine.  Patient was referred for consideration of XRT.  XRT would likely help improve his pain.  Alternatively, I would recommend continued as needed oxycodone.  He could also be started on low-dose dexamethasone for inflammatory bony pain.  PLAN: -Continue current scope of treatment -Family/patient discussing goals -Continue oxycodone for pain -Will follow  Case and plan discussed with Drs. Rogue Bussing and Wouk   Time Total: 75 minutes  Visit consisted of counseling and education dealing with the complex and emotionally intense issues of symptom management and palliative care in the setting of serious and potentially life-threatening illness.Greater than 50%  of this time was spent counseling and coordinating care related to the above assessment and plan.  Signed by: Altha Harm, PhD, NP-C

## 2020-07-11 NOTE — TOC Progression Note (Signed)
Transition of Care [facility]) - Initial/Assessment Note    Patient Details  Name: Fred Anderson MRN: [id number] Date of Birth: January 15, 1946  Transition of Care [facility]) CM/SW Contact:    Ova Freshwater Phone Number: [phone] 07/11/2020, 3:39 PM  Clinical Narrative:                  CSW spoke with Westlee Devita (spouse) [phone].  CSW explained the role of TOC in patient care.  Ms. Norgaard stated patient needs assistance with al ADLs and 24/7 care. Ms. Laforest and daughter Lowella Petties [phone] take patient to doctors appointments.  Patient is active w/ chemotherapy, Dr. Rogue Bussing is oncologist. Ms. Buehl stated Dr. Rogue Bussing has requested palliative consult to discuss goals of care.  Patient is not currently active with home health or hospice. CSW will continue to follow.  Expected Discharge Plan: [place] Barriers to Discharge: Continued Medical Work up   Patient Goals and CMS Choice        Expected Discharge Plan and Services Expected Discharge Plan: [place] In-house Referral: Clinical Social Work   Post Acute Care Choice: Home Health                                        Prior Living Arrangements/Services   Lives with:: Spouse Harlen, Danford (Spouse)   [phone] (Mobile)) Patient language and need for interpreter reviewed:: Yes Do you feel safe going back to the place where you live?: Yes      Need for Family Participation in Patient Care: Yes (Comment) Care giver support system in place?: Yes (comment)   Criminal Activity/Legal Involvement Pertinent to Current Situation/Hospitalization: No - Comment as needed  Activities of Daily Living Home Assistive Devices/Equipment: Cane (specify quad or straight),Walker (specify type) ADL Screening (condition at time of admission) Patient's cognitive ability adequate to safely complete daily activities?: Yes Is the patient deaf or have difficulty hearing?:  Yes Does the patient have difficulty seeing, even when wearing glasses/contacts?: No Does the patient have difficulty concentrating, remembering, or making decisions?: Yes Patient able to express need for assistance with ADLs?: Yes Does the patient have difficulty dressing or bathing?: No Independently performs ADLs?: Yes (appropriate for developmental age) Does the patient have difficulty walking or climbing stairs?: Yes Weakness of Legs: Both Weakness of Arms/Hands: Both  Permission Sought/Granted Permission sought to share information with : Family Supports    Share Information with NAME: Tye, Vigo (Spouse)   [phone] (Mobile)           Emotional Assessment Appearance:: Appears older than stated age Attitude/Demeanor/Rapport: Unable to Assess Affect (typically observed): Unable to Assess   Alcohol / Substance Use: Not Applicable Psych Involvement: No (comment)  Admission diagnosis:  Confusion [R41.0] Hyponatremia [E87.1] Patient Active Problem List   Diagnosis Date Noted  . Hyponatremia 07/09/2020  . HTN (hypertension) 07/09/2020  . Depression 07/09/2020  . Acute metabolic encephalopathy 82/50/0370  . Cancer of upper lobe of right lung ([place]) 05/13/2020  . Goals of care, counseling/discussion 05/13/2020  . Mass of upper lobe of right lung 05/07/2020  . Headache in front of head 05/07/2020  . Status post rotator cuff repair 04/03/2019  . Acute appendicitis with localized peritonitis 04/25/2018  . DDD (degenerative disc disease), lumbar 11/29/2017  . Fecal smearing 03/18/2017  . Family history of colon cancer 12/15/2016  . History of adenomatous  polyp of colon 09/18/2016  . High risk medication use 09/14/2016  . Diabetic peripheral neuropathy associated with type 2 diabetes mellitus ([place]) 09/05/2015  . Erectile dysfunction of organic origin 11/29/2014  . BPH with obstruction/lower urinary tract symptoms 09/02/2014  . Hematospermia 09/02/2014  . HLD  (hyperlipidemia) 02/05/2014  . Borderline diabetes 02/05/2014  . Combined hyperlipidemia associated with type 2 diabetes mellitus ([place]) 02/05/2014  . Type 2 diabetes mellitus with neurologic complication, without long-term current use of insulin ([place]) 02/05/2014   PCP:  Ezequiel Kayser, MD Pharmacy:   [id number] - [place], [place] [address] [place] [address] Phone: [phone] Fax: [phone]     Social Determinants of Health (SDOH) Interventions    Readmission Risk Interventions No flowsheet data found.

## 2020-07-12 ENCOUNTER — Ambulatory Visit: Payer: Medicare Other | Admitting: Radiation Oncology

## 2020-07-12 DIAGNOSIS — E871 Hypo-osmolality and hyponatremia: Secondary | ICD-10-CM | POA: Diagnosis not present

## 2020-07-12 LAB — BASIC METABOLIC PANEL
Anion gap: 10 (ref 5–15)
BUN: 14 mg/dL (ref 8–23)
CO2: 23 mmol/L (ref 22–32)
Calcium: 8.9 mg/dL (ref 8.9–10.3)
Chloride: 99 mmol/L (ref 98–111)
Creatinine, Ser: 0.63 mg/dL (ref 0.61–1.24)
GFR, Estimated: >60 mL/min (ref >60)
Glucose, Bld: 124 mg/dL — ABNORMAL HIGH (ref 70–99)
Potassium: 4.2 mmol/L (ref 3.5–5.1)
Sodium: 132 mmol/L — ABNORMAL LOW (ref 135–145)

## 2020-07-12 LAB — URINALYSIS, COMPLETE (UACMP) WITH MICROSCOPIC
Bacteria, UA: NONE SEEN
Bilirubin Urine: NEGATIVE
Glucose, UA: 50 mg/dL — AB
Hgb urine dipstick: NEGATIVE
Ketones, ur: 5 mg/dL — AB
Leukocytes,Ua: NEGATIVE
Nitrite: NEGATIVE
Protein, ur: NEGATIVE mg/dL
Specific Gravity, Urine: 1.015 (ref 1.005–1.030)
Squamous Epithelial / HPF: NONE SEEN (ref 0–5)
pH: 5 (ref 5.0–8.0)

## 2020-07-12 LAB — SODIUM
Sodium: 130 mmol/L — ABNORMAL LOW (ref 135–145)
Sodium: 130 mmol/L — ABNORMAL LOW (ref 135–145)

## 2020-07-12 MED ORDER — ONDANSETRON HCL 8 MG PO TABS: ORAL_TABLET | ORAL | 1 refills | Status: AC

## 2020-07-12 MED ORDER — POLYETHYLENE GLYCOL 3350 17 G PO PACK: 17.0000 g | PACK | Freq: Every day | ORAL | 0 refills | Status: AC

## 2020-07-12 MED ORDER — HEPARIN SOD (PORK) LOCK FLUSH 100 UNIT/ML IV SOLN
500.0000 [IU] | Freq: Once | INTRAVENOUS | Status: AC
  Administered 2020-07-12: 500 [IU] via INTRAVENOUS
  Filled 2020-07-12: qty 5

## 2020-07-12 NOTE — Care Management Important Message (Signed)
Important Message  Patient Details  Name: Fred Anderson MRN: [id number] Date of Birth: [birth year]   Medicare Important Message Given:  Yes     Dannette Barbara 07/12/2020, 1:40 PM

## 2020-07-12 NOTE — Progress Notes (Signed)
Fred Anderson to be D/C'd Home per MD order.  Discussed prescriptions and follow up appointments with the patient and wife. Prescriptions were sent to patient's pharmacy, medication list explained in detail. Pt verbalized understanding. Foley catherter placed per MD order with assistance of Georg Ruddle, RN. Provided indwelling catheter care education and explained the importance of following up with Urologist as scheduled. Pt and wife verbalized understanding.  Allergies as of 07/12/2020      Reactions   Atorvastatin Other (See Comments)   Muscle pain.      Medication List    STOP taking these medications   baclofen 20 MG tablet Commonly known as: LIORESAL   citalopram 20 MG tablet Commonly known as: CELEXA   prochlorperazine 10 MG tablet Commonly known as: COMPAZINE     TAKE these medications   acetaminophen 500 MG tablet Commonly known as: TYLENOL Take 1,000 mg by mouth every 6 (six) hours as needed.   albuterol 108 (90 Base) MCG/ACT inhaler Commonly known as: VENTOLIN HFA Inhale 2 puffs into the lungs every 6 (six) hours as needed for wheezing or shortness of breath.   aspirin 325 MG tablet Take 325 mg by mouth daily.   budesonide-formoterol 160-4.5 MCG/ACT inhaler Commonly known as: Symbicort Inhale 2 puffs into the lungs in the morning and at bedtime. What changed:   when to take this  reasons to take this   chlorpheniramine-HYDROcodone 10-8 MG/5ML Suer Commonly known as: TUSSIONEX Take 5 mLs by mouth at bedtime as needed for cough.   dexamethasone 4 MG tablet Commonly known as: DECADRON Take one pill AM & PM x 2 days; one day before and one day after chemo. None on the day of chemo.   Fish Oil 500 MG Caps Take 500 mg by mouth 2 (two) times daily.   folic acid 1 MG tablet Commonly known as: FOLVITE Take 1 tablet (1 mg total) by mouth daily.   gabapentin 300 MG capsule Commonly known as: NEURONTIN Start take 1 pill at night x3 days; then one pill  twice a day. What changed:   how much to take  how to take this  when to take this   lidocaine-prilocaine cream Commonly known as: EMLA Apply 1 application topically as needed.   lovastatin 40 MG tablet Commonly known as: MEVACOR Take 40 mg by mouth at bedtime.   metFORMIN 500 MG tablet Commonly known as: GLUCOPHAGE Take 1,000 mg by mouth 2 (two) times daily.   multivitamin with minerals Tabs tablet Take 1 tablet by mouth daily.   ondansetron 8 MG tablet Commonly known as: ZOFRAN One pill every 8 hours as needed for nausea/vomitting.   oxyCODONE 5 MG immediate release tablet Commonly known as: Oxy IR/ROXICODONE Take 1 tablet (5 mg total) by mouth every 8 (eight) hours as needed for severe pain.   polyethylene glycol 17 g packet Commonly known as: MIRALAX / GLYCOLAX Take 17 g by mouth daily. Start taking on: July 13, 2020   tamsulosin 0.4 MG Caps capsule Commonly known as: FLOMAX Take 1 capsule (0.4 mg total) by mouth daily.       Vitals:   07/12/20 0613 07/12/20 0954  BP: (!) 95/55 114/70  Pulse: (!) 107 99  Resp: 16 19  Temp: 97.9 F (36.6 C) 98.5 F (36.9 C)  SpO2: 95% 93%    Tele box removed and returned.Skin clean, dry and intact without evidence of skin break down, no evidence of skin tears noted. IV catheter discontinued intact. Site  without signs and symptoms of complications. Dressing and pressure applied. Pt denies pain at this time. No complaints noted.  An After Visit Summary was printed and given to the patient. Patient escorted via [place], and D/C home via private auto.  Rolley Sims

## 2020-07-12 NOTE — Progress Notes (Signed)
[place] for Hypertonic Saline Indication: Hyponatremia  Allergies  Allergen Reactions  . Atorvastatin Other (See Comments)    Muscle pain.    Patient Measurements: Height: 5\' 11"  (180.3 cm) Weight: 102.4 kg (225 lb 11.2 oz) IBW/kg (Calculated) : 75.3  Vital Signs: Temp: 97.9 F (36.6 C) (04/29 0613) Temp Source: Oral (04/29 1975) BP: 95/55 (04/29 8832) Pulse Rate: 107 (04/29 5498) Intake/Output from previous day: 04/28 0701 - 04/29 0700 In: 360 [P.O.:360] Out: 3066 [Urine:3066] Intake/Output from this shift: No intake/output data recorded.  Labs: Recent Labs    07/10/20 0105 07/10/20 0644 07/11/20 0521  WBC 7.9  --   --   HGB 11.3*  --   --   HCT 32.1*  --   --   PLT 191  --   --   CREATININE 0.41* 0.43* 0.63   Estimated Creatinine Clearance: 98.7 mL/min (by C-G formula based on SCr of 0.63 mg/dL).   Assessment: 75 y.o. male with medical history significant of NSCLC on chemotherapy, HTN, HLD, pre-DM, depression, BPH, melanoma, kidney stone, who presents with generalized weakness, confusion. Pharmacy has been consulted for hypertonic saline due to severe hyponatremia. Na on admission was 115. Hypertonic saline started @35ml /hr.   4/26 @1515  Na 115 4/26 @1720  Na 117 4/26 @1916  Na 118 4/26 @2129  Na 118 - rate increased to 67ml/hr  4/27 @0105  Na 119 - rate increased to 30ml/hr 4/27 @0335  Na 122 4/27 @0644  Na 124 - rate decreased to 73ml/hr 4/27 @0928  Na 124 4/27 @1440  Na 125 4/27 @2028  Na 127 4/28 @0019  Na 128 - discontinued d/t incr >30mEq/L in 24 hours 4/28 @0839  Na 129 4/28 @1323  Na 130 4/28 @2008  Na 130 4/29 @0610  Na 130  Goal of Therapy:  Na WNL - increase by 4-6 mEq/L in 4-6 hours  Plan:   Na 129>130x3 - Current plan is more conservative to use loop (torsemide) if Na drops slightly. will f/u with nephro plans for restarting hypertonic saline.  Discussed with MD regarding Celexa high incidence of  SIADH/hyponatremia. Will d/w family today and consider options.   Will notify MD if Na increase >4 mEq/L in 2 hours or >6 mEq/L in 4 hours or >4mEq/L in 24 hours  Continue to monitor Na q4h  Lorna Dibble, PharmD  07/12/2020 9:10 AM

## 2020-07-12 NOTE — Discharge Instructions (Signed)
Please limit fluid intake to no more than 1 liter per day       Hyponatremia Hyponatremia is when the amount of salt (sodium) in your blood is too low. When sodium levels are low, your cells absorb extra water, which causes them to swell. The swelling happens throughout the body, but it mostly affects the brain. What are the causes? This condition may be caused by:  Certain medical conditions, such as: ? Heart, kidney, or liver problems. ? Thyroid problems. ? Adrenal gland problems. ? Metabolic conditions, such as Addison disease or syndrome of inappropriate antidiuretic hormone (SIADH).  Severe vomiting or diarrhea.  Certain medicines or illegal drugs.  Dehydration.  Drinking too much water.  Eating a diet that is low in sodium.  Large burns on your body.  Excessive sweating. What increases the risk? You are more likely to develop this condition if you:  Have long-term (chronic) kidney disease.  Have heart failure.  Have a medical condition that causes frequent or excessive diarrhea.  Participate in intense physical activities, such as marathon running.  Take certain medicines that affect the sodium and fluid balance in the blood. Some of these medicine types include: ? Diuretics. ? NSAIDs. ? Some opioid pain medicines. ? Some antidepressants. ? Some seizure prevention medicines. What are the signs or symptoms? Symptoms of this condition include:  Headache.  Nausea and vomiting.  Being very tired (lethargic).  Muscle weakness and cramping.  Loss of appetite.  Feeling weak or light-headed. Severe symptoms of this condition include:  Confusion.  Agitation.  Having a rapid heart rate.  Passing out (fainting).  Seizures.  Coma. How is this diagnosed? This condition is diagnosed based on:  A physical exam.  Your medical history.  Tests, including: ? Blood tests. ? Urine tests. How is this treated? Treatment for this condition depends  on the cause. Treatment may include:  Getting fluids through an IV that is inserted into one of your veins.  Medicines to correct the sodium imbalance. If medicines are causing the condition, the medicines will need to be adjusted.  Limiting your water or fluid intake to get the correct sodium balance.  Monitoring in the hospital setting to closely watch your symptoms for improvement.   Follow these instructions at home:  Take over-the-counter and prescription medicines only as told by your health care provider. Many medicines can make this condition worse. Talk with your health care provider about any medicines that you are currently taking.  Carefully follow a recommended diet as told by your health care provider.  Carefully follow instructions from your health care provider about fluid restrictions.  Do not drink alcohol.  Keep all follow-up visits as told by your health care provider. This is important.   Contact a health care provider if:  You develop worsening nausea, fatigue, headache, confusion, or weakness.  Your symptoms go away and then return.  You have problems following the recommended diet. Get help right away if:  You have a seizure.  You pass out.  You have ongoing diarrhea or vomiting. Summary  Hyponatremia is when the amount of salt (sodium) in your blood is too low.  When sodium levels are low, your cells absorb extra water, which causes them to swell.  The swelling happens throughout the body, but it mostly affects the brain.  Treatment for this condition depends on the cause. It may include IV fluids, medicines, and limiting your fluid intake. This information is not intended to replace advice given  to you by your health care provider. Make sure you discuss any questions you have with your health care provider. Document Revised: 01/14/2018 Document Reviewed: 01/14/2018 Elsevier Patient Education  2021 [employer].

## 2020-07-12 NOTE — Progress Notes (Signed)
Fred Anderson   DOB:1945-09-15   IE#:[id number]    Subjective: Patient having breakfast.  Denies any nausea vomiting.  Appetite is improving.  Patient had difficulty with urination/urine obstruction needing straight cath.  He has not had a bowel movement yet or urinated.  Objective:  Vitals:   07/12/20 0613 07/12/20 0954  BP: (!) 95/55 114/70  Pulse: (!) 107 99  Resp: 16 19  Temp: 97.9 F (36.6 C) 98.5 F (36.9 C)  SpO2: 95% 93%     Intake/Output Summary (Last 24 hours) at 07/12/2020 1309 Last data filed at 07/12/2020 0945 Gross per 24 hour  Intake 480 ml  Output 3066 ml  Net -2586 ml    Physical Exam Constitutional:      Comments: Patient up in the bed resting/having his breakfast.  Accompanied by his wife/daughter.  HENT:     Head: Normocephalic and atraumatic.     Mouth/Throat:     Pharynx: No oropharyngeal exudate.  Eyes:     Pupils: Pupils are equal, round, and reactive to light.  Cardiovascular:     Rate and Rhythm: Normal rate and regular rhythm.  Pulmonary:     Effort: No respiratory distress.     Breath sounds: No wheezing.     Comments: Decreased air entry right side. Abdominal:     General: Bowel sounds are normal. There is no distension.     Palpations: Abdomen is soft. There is no mass.     Tenderness: There is no abdominal tenderness. There is no guarding or rebound.  Musculoskeletal:        General: No tenderness. Normal range of motion.     Cervical back: Normal range of motion and neck supple.  Skin:    General: Skin is warm.  Neurological:     Mental Status: He is alert and oriented to person, place, and time.  Psychiatric:        Mood and Affect: Affect normal.      Labs:  Lab Results  Component Value Date   WBC 7.9 07/10/2020   HGB 11.3 (L) 07/10/2020   HCT 32.1 (L) 07/10/2020   MCV 87.7 07/10/2020   PLT 191 07/10/2020   NEUTROABS 5.1 07/09/2020    Lab Results  Component Value Date   NA 130 (L) 07/12/2020   K 4.2 07/12/2020    CL 99 07/12/2020   CO2 23 07/12/2020    Studies:  MR THORACIC SPINE W WO CONTRAST  Result Date: 07/10/2020 CLINICAL DATA:  Metastatic non-small cell lung cancer. Back pain. History of back surgery as a teenager. EXAM: MRI THORACIC WITHOUT AND WITH CONTRAST TECHNIQUE: Multiplanar and multiecho pulse sequences of the thoracic spine were obtained without and with intravenous contrast. CONTRAST:  18mL GADAVIST GADOBUTROL 1 MMOL/ML IV SOLN COMPARISON:  CT chest 07/05/2020 FINDINGS: Alignment:  Normal Vertebrae: Chronic compression fracture T12 with posterior wire fixation. Widespread metastatic disease throughout the bone marrow of the thoracic spine with all thoracic levels involved. Diffuse rib involvement. No acute fracture. No epidural tumor. Cord:  Negative for cord compression.  Cord signal normal. Paraspinal and other soft tissues: Large right pleural effusion Disc levels: Negative for thoracic spinal stenosis. Small central disc protrusion at T7-8. 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 No pathologic fracture. No cord compression. Large right pleural effusion Chronic fracture T12 with posterior wire fixation. Electronically Signed   By: Franchot Gallo M.D.   On: 07/10/2020 14:41   MR  Lumbar Spine W Wo Contrast  Result Date: 07/10/2020 CLINICAL DATA:  Non-small-cell lung cancer. Back pain. History of back surgery as a teenager. EXAM: MRI LUMBAR SPINE WITHOUT AND WITH CONTRAST TECHNIQUE: Multiplanar and multiecho pulse sequences of the lumbar spine were obtained without and with intravenous contrast. CONTRAST:  53mL GADAVIST GADOBUTROL 1 MMOL/ML IV SOLN COMPARISON:  Lumbar MRI 11/01/2009 FINDINGS: Segmentation:  Normal Alignment:  Mild retrolisthesis L2-3 Vertebrae: Widespread skeletal metastatic disease. Numerous small lesions are seen throughout the bone marrow of the lumbar spine, sacrum, and iliac bones. No pathologic  fracture. Chronic fracture T12 with posterior wire fixation as noted on thoracic MRI. Conus medullaris and cauda equina: Conus extends to the L1-2 level. Conus and cauda equina appear normal. Paraspinal and other soft tissues: Negative for paraspinous mass or adenopathy. Right pleural effusion Disc levels: L1-2: Negative L2-3: Mild retrolisthesis. Diffuse disc bulging and spurring without stenosis. Interval resolution of right foraminal disc protrusion seen on the prior MRI L3-4: Disc degeneration with diffuse disc bulging and spurring. Bilateral facet degeneration. Mild subarticular stenosis bilaterally L4-5: Disc degeneration with diffuse disc bulging and bilateral facet hypertrophy. Mild to moderate spinal stenosis. Moderate subarticular stenosis and foraminal stenosis on the right. L5-S1: Disc bulging and spurring. Left foraminal disc and osteophyte and left foraminal encroachment unchanged from the prior MRI. 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 No pathologic fracture. Lumbar degenerative changes. Electronically Signed   By: Franchot Gallo M.D.   On: 07/10/2020 14:46   DG Chest Port 1 View  Result Date: 07/11/2020 CLINICAL DATA:  Pleural effusion, status post right thoracentesis. EXAM: PORTABLE CHEST 1 VIEW COMPARISON:  07/09/2020 FINDINGS: Power injectable right Port-A-Cath tip: Right atrium. Reduced right pleural effusion with resolution of the blunting of the right lateral costophrenic angle. No visible pneumothorax. Atherosclerotic calcification of the aortic arch. Fatty bandlike opacity at the right lung base favoring mild residual atelectasis. IMPRESSION: 1. Substantially reduced right pleural effusion, status post right thoracentesis. No visible pneumothorax. Mild atelectasis at the right lung base. 2.  Aortic Atherosclerosis (ICD10-I70.0). Electronically Signed   By: Van Clines M.D.   On: 07/11/2020 11:05   US THORACENTESIS ASP  PLEURAL SPACE W/IMG GUIDE  Result Date: 07/11/2020 INDICATION: Patient with a history of non-small cell lung cancer presents today with a right pleural effusion. Interventional radiology asked to perform a therapeutic and diagnostic thoracentesis. EXAM: ULTRASOUND GUIDED THORACENTESIS MEDICATIONS: 1% lidocaine 10 mL COMPLICATIONS: None immediate. PROCEDURE: An ultrasound guided thoracentesis was thoroughly discussed with the patient and questions answered. The benefits, risks, alternatives and complications were also discussed. The patient understands and wishes to proceed with the procedure. Written consent was obtained. Ultrasound was performed to localize and mark an adequate pocket of fluid in the right chest. The area was then prepped and draped in the normal sterile fashion. 1% Lidocaine was used for local anesthesia. Under ultrasound guidance a 6 Fr Safe-T-Centesis catheter was introduced. Thoracentesis was performed. The catheter was removed and a dressing applied. FINDINGS: A total of approximately 2.5 L of amber-colored/blood-tinged fluid fluid was removed. Samples were sent to the laboratory as requested by the clinical team. IMPRESSION: Successful ultrasound guided right thoracentesis yielding 2.5 L of pleural fluid. Read by: Soyla Dryer, NP Electronically Signed   By: Sandi Mariscal M.D.   On: 07/11/2020 11:08    Cancer of upper lobe of right lung [facility]) #75 year old male patient with a history of metastatic/stage IV lung cancer currently admitted to hospital for  severe hyponatremia.  # Right lung adenocarcinoma/stage IV; s/p 2 cycles of carbo Alimta Keytruda; April 25th CT-stable to improved right upper lobe lung masses; mildly increased right-sided pleural effusion.  Continue hold further systemic chemotherapy at this time-see below  # Severe hyponatremia- 119 [sodium] secondary to chemotherapy-SIADH- Sodium 130 this morning.  Discussed regarding free water fluid restriction; increase salt  intake.  Will defer to Dr. Candiss Norse nephrology regarding tolvaptan.    #Right-sided malignant pleural effusion-symptomatic-s/p therapeutic thoracentesis-see discussion below.  #Back pain/Right hip pain/radiating to right leg with twitching and numbness-we reviewed the MRI findings that showed significant thoracic/lumbar spine metastases; however no evidence of epidural involvement/spinal cord compression.  Patient reluctant with radiation given the potential side effects.  However given minimal side effects expected from pelvic radiation recommend consideration of at least a discussion with Dr. Donella Stade.  I have discussed with Dr. Donella Stade; referral in place.   #DNR/DNI-I would agree with DNR/DNI given his incurable cancer.   #I had a long discussion with the patient/wife and daughter-discussed that it is unclear to me why patient keeps reaccumulating pleural effusion.  Possibilities include lack of response/progress of therapy versus too early to assess response.  I would recommend not making decisions regarding future of chemotherapy at this time.  However in the next few days last few weeks-if patient continues to decline, I think it is reasonable to consider hospice.  We will follow him closely.  Discussed with [employer].  Patient is clinically stable from oncology standpoint to be discharged, discussed with Dr.Wouk.   # 40 minutes face-to-face with the patient discussing the above plan of care; more than 50% of time spent on prognosis/ natural history; counseling and coordination.    Cammie Sickle, MD 07/12/2020  1:09 PM

## 2020-07-12 NOTE — Consult Note (Signed)
NEW PATIENT EVALUATION  Name: Fred Anderson  MRN: [id number]  Date:   07/09/2020     DOB: 27-Dec-1945   This 75 y.o. male patient presents and seen is an inpatient in the hospital and patient with known stage IV adenocarcinoma of the lung with severe back pain and right hip pain secondary to metastatic disease.  REFERRING PHYSICIAN: No ref. provider found  CHIEF COMPLAINT:  Chief Complaint  Patient presents with  . Abnormal Lab    DIAGNOSIS: The primary encounter diagnosis was Confusion. Diagnoses of Hyponatremia, Pleural effusion, right, and Pleural effusion on right were also pertinent to this visit.   PREVIOUS INVESTIGATIONS:  MRI scans and PET scan reviewed Clinical notes reviewed Pathology report reviewed  HPI: Patient is a 75 year old inpatient patient of Dr. Lesly Rubenstein Mondays who has metastatic stage IV lung cancer.  He is admitted for severe hyponatremia.  He has been on narcotic analgesics for severe back pain and MRI scan of also thoracic lumbar spine shows widespread metastatic disease throughout the lumbar spine sacrum and iliac bones.  No pathologic fractures.  His PET scan was also hot in his right acetabular region.  His recent CT scan of the chest shows slight decrease in size of anterior right upper lobe pulm pulmonary nodule stable masslike opacity in central right upper lobe and right stable right hilar and right paratracheal lymphadenopathy.  MRI scan showed no evidence of cord compression I been asked to evaluate him inpatient for palliative radiation therapy.  Patient is doing fairly well he states his pain is not that significant although according to family members it is at times extreme.  PLANNED TREATMENT REGIMEN: Palliative radiation therapy to lumbar spine SI joints and right hip  PAST MEDICAL HISTORY:  has a past medical history of Arthritis, Blood in semen, BPH (benign prostatic hypertrophy), Cancer ([place]), DDD (degenerative disc disease), lumbar, Diabetes mellitus  without complication ([place]), Erectile dysfunction, Hematospermia, Hemorrhoids, History of adenomatous polyp of colon, History of kidney stones, Hyperlipidemia, Hypertension, Kidney stones, Obesity, 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[place]), and Prediabetes.    PAST SURGICAL HISTORY:  Past Surgical History:  Procedure Laterality Date  . BACK SURGERY  age 50   Broken back  . COLONOSCOPY WITH PROPOFOL N/A 05/05/2017   Procedure: COLONOSCOPY WITH PROPOFOL;  Surgeon: Toledo, Benay Pike, MD;  Location: [facility] ENDOSCOPY;  Service: Gastroenterology;  Laterality: N/A;  . HEMORRHOID SURGERY    . IR IMAGING GUIDED PORT INSERTION  05/17/2020  . LAPAROSCOPIC APPENDECTOMY N/A 04/25/2018   Procedure: APPENDECTOMY LAPAROSCOPIC;  Surgeon: Herbert Pun, MD;  Location: [facility] ORS;  Service: General;  Laterality: N/A;  . SHOULDER ARTHROSCOPY WITH OPEN ROTATOR CUFF REPAIR Right 04/03/2019   Procedure: SHOULDER ARTHROSCOPY WITH SUBSACAPULARIS REPAIR, OPEN ROTATOR CUFF REPAIR, SUBARCOMIAL DECOMPRESSION, BICEPS TENODESIS;  Surgeon: Leim Fabry, MD;  Location: [facility] ORS;  Service: Orthopedics;  Laterality: Right;  . SMALL INTESTINE SURGERY      FAMILY HISTORY: family history includes Bone cancer in his brother; Cancer in his mother; Colon cancer in his father; Kidney cancer in his brother; Liver cancer in his sister.  SOCIAL HISTORY:  reports that he quit smoking about 31 years ago. He has never used smokeless tobacco. He reports current alcohol use of about 12.0 standard drinks of alcohol per week. He reports that he does not use drugs.  ALLERGIES: Atorvastatin  MEDICATIONS:  Current Facility-Administered Medications  Medication Dose Route Frequency Provider Last Rate Last Admin  . acetaminophen (TYLENOL) tablet 650 mg  650 mg  Oral Q6H PRN Ivor Costa, MD      . albuterol (VENTOLIN HFA) 108 (90 Base) MCG/ACT inhaler 2 puff  2 puff Inhalation Q6H PRN Ivor Costa, MD      . aspirin tablet 325 mg  325 mg  Oral Daily Ivor Costa, MD   325 mg at 07/12/20 1106  . bisacodyl (DULCOLAX) EC tablet 5 mg  5 mg Oral Daily PRN Opyd, Ilene Qua, MD   5 mg at 07/11/20 0338  . Chlorhexidine Gluconate Cloth 2 % PADS 6 each  6 each Topical Q2200 Ivor Costa, MD   6 each at 07/10/20 2147  . enoxaparin (LOVENOX) injection 52.5 mg  0.5 mg/kg Subcutaneous Q24H Ivor Costa, MD   52.5 mg at 07/11/20 2153  . feeding supplement (ENSURE ENLIVE / ENSURE PLUS) liquid 237 mL  237 mL Oral BID BM Wouk, Ailene Rud, MD   237 mL at 07/12/20 1111  . folic acid (FOLVITE) tablet 1 mg  1 mg Oral Daily Ivor Costa, MD   1 mg at 07/12/20 1106  . gabapentin (NEURONTIN) capsule 300 mg  300 mg Oral TID Ivor Costa, MD   300 mg at 07/12/20 1106  . Glycerin (Adult) 2 g suppository 1 suppository  1 suppository Rectal Daily PRN Wouk, Ailene Rud, MD      . lidocaine-prilocaine (EMLA) cream 1 application  1 application Topical PRN Ivor Costa, MD      . mometasone-formoterol [facility]) 200-5 MCG/ACT inhaler 2 puff  2 puff Inhalation BID Ivor Costa, MD   2 puff at 07/12/20 1110  . multivitamin with minerals tablet 1 tablet  1 tablet Oral Daily Ivor Costa, MD   1 tablet at 07/12/20 1106  . ondansetron (ZOFRAN) injection 4 mg  4 mg Intravenous Q8H PRN Ivor Costa, MD      . oxyCODONE (Oxy IR/ROXICODONE) immediate release tablet 5 mg  5 mg Oral Q8H PRN Ivor Costa, MD   5 mg at 07/11/20 0338  . polyethylene glycol (MIRALAX / GLYCOLAX) packet 17 g  17 g Oral Daily Gwynne Edinger, MD   17 g at 07/12/20 1107  . pravastatin (PRAVACHOL) tablet 40 mg  40 mg Oral q1800 Ivor Costa, MD   40 mg at 07/11/20 1744  . senna-docusate (Senokot-S) tablet 1 tablet  1 tablet Oral QHS Opyd, Ilene Qua, MD   1 tablet at 07/11/20 2153  . tamsulosin (FLOMAX) capsule 0.4 mg  0.4 mg Oral Daily Ivor Costa, MD   0.4 mg at 07/12/20 1106    ECOG PERFORMANCE STATUS:  2 - Symptomatic, <50% confined to bed  REVIEW OF SYSTEMS: Patient denies any weight loss, fatigue, weakness,  fever, chills or night sweats. Patient denies any loss of vision, blurred vision. Patient denies any ringing  of the ears or hearing loss. No irregular heartbeat. Patient denies heart murmur or history of fainting. Patient denies any chest pain or pain radiating to her upper extremities. Patient denies any shortness of breath, difficulty breathing at night, cough or hemoptysis. Patient denies any swelling in the lower legs. Patient denies any nausea vomiting, vomiting of blood, or coffee ground material in the vomitus. Patient denies any stomach pain. Patient states has had normal bowel movements no significant constipation or diarrhea. Patient denies any dysuria, hematuria or significant nocturia. Patient denies any problems walking, swelling in the joints or loss of balance. Patient denies any skin changes, loss of hair or loss of weight. Patient denies any excessive worrying or anxiety or significant depression.  Patient denies any problems with insomnia. Patient denies excessive thirst, polyuria, polydipsia. Patient denies any swollen glands, patient denies easy bruising or easy bleeding. Patient denies any recent infections, allergies or URI. Patient "s visual fields have not changed significantly in recent time.   PHYSICAL EXAM: BP 114/70 (BP Location: Right Arm)   Pulse 99   Temp 98.5 F (36.9 C) (Oral)   Resp 19   Ht 5\' 11"  (1.803 m)   Wt 225 lb 11.2 oz (102.4 kg)   SpO2 93%   BMI 31.48 kg/m  Motor or sensory and DTR levels are equal symmetric in the lower extremities.  Motor no pain is elicited on range of motion of his lower extremities.  No pain is elicited on deep palpation of his lumbar spine.  Well-developed well-nourished patient in NAD. HEENT reveals PERLA, EOMI, discs not visualized.  Oral cavity is clear. No oral mucosal lesions are identified. Neck is clear without evidence of cervical or supraclavicular adenopathy. Lungs are clear to A&P. Cardiac examination is essentially  unremarkable with regular rate and rhythm without murmur rub or thrill. Abdomen is benign with no organomegaly or masses noted. Motor sensory and DTR levels are equal and symmetric in the upper and lower extremities. Cranial nerves II through XII are grossly intact. Proprioception is intact. No peripheral adenopathy or edema is identified. No motor or sensory levels are noted. Crude visual fields are within normal range.  LABORATORY DATA: Pathology report reviewed    RADIOLOGY RESULTS: MRI scans CT scans all reviewed as well as PET CT scan   IMPRESSION: Widespread metastatic disease from known stage IV adenocarcinoma the lung in 75 year old male  PLAN: This time of offer palliative radiation therapy 30 Gray in 10 fractions to both his lower lumbar spine SI joints as and right hip.  Risks and benefits of treatment occluding possible diarrhea fatigue alteration blood count skin reaction all were discussed in detail with the patient and his family.  Patient is somewhat reluctant to undergo palliative radiation although I have given him a simulation appointment for early next week.  All parties comprehend my recommendations well.  I would like to take this opportunity to thank you for allowing me to participate in the care of your patient.Noreene Filbert, MD

## 2020-07-12 NOTE — Progress Notes (Signed)
[facility] Date of Admission:  07/09/2020           Reason for Consult:  hyponatremia   Referring Provider: Gwynne Edinger, MD Primary Care Provider: Ezequiel Kayser, MD   History of Presenting Illness:  Fred Anderson is a 75 y.o. 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  Currently undergoing chemotherapy with  carbo-alimta-keytruda and is followed by Dr. Rogue Bussing at the cancer center.  Presented to the ER because of feeling weak, decreased oral intake and some dyspnea on exertion.   Found to have severe hyponatremia  Patient seen resting in bed with wife and daughter at bedside Continues to complain of urinary retention States he no longer has the urge to urinate   OBJECTIVE: Blood pressure 114/70, pulse 99, temperature 98.5 F (36.9 C), temperature source Oral, resp. rate 19, height 5\' 11"  (1.803 m), weight 102.4 kg, SpO2 93 %.    Physical Exam: General:  No acute distress,    HEENT  anicteric, moist oral mucous membrane  Pulm/lungs  normal breathing effort,    CVS/Heart  regular rhythm, no rub or gallop  Abdomen:   Soft, nontender, obese  Extremities:  No peripheral edema  Neurologic:  Alert, oriented, able to follow commands  Skin:  No acute rashes     Lab Results Lab Results  Component Value Date   WBC 7.9 07/10/2020   HGB 11.3 (L) 07/10/2020   HCT 32.1 (L) 07/10/2020   MCV 87.7 07/10/2020   PLT 191 07/10/2020    Lab Results  Component Value Date   CREATININE 0.63 07/12/2020   BUN 14 07/12/2020   NA 130 (L) 07/12/2020   K 4.2 07/12/2020   CL 99 07/12/2020   CO2 23 07/12/2020    Lab Results  Component Value Date   ALT 18 07/09/2020   AST 48 (H) 07/09/2020   ALKPHOS 249 (H) 07/09/2020   BILITOT 0.8 07/09/2020     Microbiology: Recent Results (from the past 240 hour(s))  Resp Panel by RT-PCR (Flu A&B, Covid) Nasopharyngeal Swab      Status: None   Collection Time: 07/09/20 11:03 AM   Specimen: Nasopharyngeal Swab; Nasopharyngeal(NP) swabs in vial transport medium  Result Value Ref Range Status   SARS Coronavirus 2 by RT PCR NEGATIVE NEGATIVE Final    Comment: (NOTE) SARS-CoV-2 target nucleic acids are NOT DETECTED.  The SARS-CoV-2 RNA is generally detectable in upper respiratory specimens during the acute phase of infection. The lowest concentration of SARS-CoV-2 viral copies this assay can detect is 138 copies/mL. A negative result does not preclude SARS-Cov-2 infection and should not be used as the sole basis for treatment or other patient management decisions. A negative result may occur with  improper specimen collection/handling, submission of specimen other than nasopharyngeal swab, presence of viral mutation(s) within the areas targeted by this assay, and inadequate number of viral copies(<138 copies/mL). A negative result must be combined with clinical observations, patient history, and epidemiological information. The expected result is Negative.  Fact Sheet for Patients:  EntrepreneurPulse.com.au  Fact Sheet for Healthcare Providers:  IncredibleEmployment.be  This test is no t yet approved or cleared by the [place] FDA and  has been authorized for detection and/or diagnosis of SARS-CoV-2 by FDA under an Emergency Use Authorization (EUA). This EUA will remain  in effect (meaning this test can be used) for the duration of the COVID-19 declaration  under Section 564(b)(1) of the Act, 21 U.S.C.section 360bbb-3(b)(1), unless the authorization is terminated  or revoked sooner.       Influenza A by PCR NEGATIVE NEGATIVE Final   Influenza B by PCR NEGATIVE NEGATIVE Final    Comment: (NOTE) The Xpert Xpress SARS-CoV-2/FLU/RSV plus assay is intended as an aid in the diagnosis of influenza from Nasopharyngeal swab specimens and should not be used as a sole basis  for treatment. Nasal washings and aspirates are unacceptable for Xpert Xpress SARS-CoV-2/FLU/RSV testing.  Fact Sheet for Patients: EntrepreneurPulse.com.au  Fact Sheet for Healthcare Providers: IncredibleEmployment.be  This test is not yet approved or cleared by the [place] FDA and has been authorized for detection and/or diagnosis of SARS-CoV-2 by FDA under an Emergency Use Authorization (EUA). This EUA will remain in effect (meaning this test can be used) for the duration of the COVID-19 declaration under Section 564(b)(1) of the Act, 21 U.S.C. section 360bbb-3(b)(1), unless the authorization is terminated or revoked.  Performed at [facility], [address] [place], [place] [address]   MRSA PCR Screening     Status: None   Collection Time: 07/09/20  5:25 PM   Specimen: Nasal Mucosa; Nasopharyngeal  Result Value Ref Range Status   MRSA by PCR NEGATIVE NEGATIVE Final    Comment:        The GeneXpert MRSA Assay (FDA approved for NASAL specimens only), is one component of a comprehensive MRSA colonization surveillance program. It is not intended to diagnose MRSA infection nor to guide or monitor treatment for MRSA infections. Performed at [facility] [address] [place], [place] [address]   Body fluid culture w Gram Stain     Status: None (Preliminary result)   Collection Time: 07/11/20 10:00 AM   Specimen: PATH Cytology Pleural fluid  Result Value Ref Range Status   Specimen Description   Final    PLEURAL Performed at [facility], [address], [place], [place] [address]    Special Requests   Final    PLEURAL Performed at [facility], [place], [place] [address]    Gram Stain NO WBC SEEN NO ORGANISMS SEEN   Final   Culture   Final    NO GROWTH < 24 HOURS Performed at [facility], [place] [address], [place], [place] [address]    Report Status  PENDING  Incomplete    Medications: Scheduled Meds: . aspirin  325 mg Oral Daily  . Chlorhexidine Gluconate Cloth  6 each Topical Q2200  . enoxaparin (LOVENOX) injection  0.5 mg/kg Subcutaneous Q24H  . feeding supplement  237 mL Oral BID BM  . folic acid  1 mg Oral Daily  . gabapentin  300 mg Oral TID  . heparin lock flush  500 Units Intravenous Once  . mometasone-formoterol  2 puff Inhalation BID  . multivitamin with minerals  1 tablet Oral Daily  . polyethylene glycol  17 g Oral Daily  . pravastatin  40 mg Oral q1800  . senna-docusate  1 tablet Oral QHS  . tamsulosin  0.4 mg Oral Daily   Continuous Infusions:  PRN Meds:.acetaminophen, albuterol, bisacodyl, Glycerin (Adult), lidocaine-prilocaine, ondansetron (ZOFRAN) IV, oxyCODONE  Allergies  Allergen Reactions  . Atorvastatin Other (See Comments)    Muscle pain.    Urinalysis: No results for input(s): COLORURINE, LABSPEC, PHURINE, GLUCOSEU, HGBUR, BILIRUBINUR, KETONESUR, PROTEINUR, UROBILINOGEN, NITRITE, LEUKOCYTESUR in the last 72 hours.  Invalid input(s): APPERANCEUR    Imaging: MR  THORACIC SPINE W WO CONTRAST  Result Date: 07/10/2020 CLINICAL DATA:  Metastatic non-small cell lung cancer. Back pain. History of back surgery as a teenager. EXAM: MRI THORACIC WITHOUT AND WITH CONTRAST TECHNIQUE: Multiplanar and multiecho pulse sequences of the thoracic spine were obtained without and with intravenous contrast. CONTRAST:  58mL GADAVIST GADOBUTROL 1 MMOL/ML IV SOLN COMPARISON:  CT chest 07/05/2020 FINDINGS: Alignment:  Normal Vertebrae: Chronic compression fracture T12 with posterior wire fixation. Widespread metastatic disease throughout the bone marrow of the thoracic spine with all thoracic levels involved. Diffuse rib involvement. No acute fracture. No epidural tumor. Cord:  Negative for cord compression.  Cord signal normal. Paraspinal and other soft tissues: Large right pleural effusion Disc levels: Negative for thoracic  spinal stenosis. Small central disc protrusion at T7-8. 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 No pathologic fracture. No cord compression. Large right pleural effusion Chronic fracture T12 with posterior wire fixation. Electronically Signed   By: Franchot Gallo M.D.   On: 07/10/2020 14:41   MR Lumbar Spine W Wo Contrast  Result Date: 07/10/2020 CLINICAL DATA:  Non-small-cell lung cancer. Back pain. History of back surgery as a teenager. EXAM: MRI LUMBAR SPINE WITHOUT AND WITH CONTRAST TECHNIQUE: Multiplanar and multiecho pulse sequences of the lumbar spine were obtained without and with intravenous contrast. CONTRAST:  76mL GADAVIST GADOBUTROL 1 MMOL/ML IV SOLN COMPARISON:  Lumbar MRI 11/01/2009 FINDINGS: Segmentation:  Normal Alignment:  Mild retrolisthesis L2-3 Vertebrae: Widespread skeletal metastatic disease. Numerous small lesions are seen throughout the bone marrow of the lumbar spine, sacrum, and iliac bones. No pathologic fracture. Chronic fracture T12 with posterior wire fixation as noted on thoracic MRI. Conus medullaris and cauda equina: Conus extends to the L1-2 level. Conus and cauda equina appear normal. Paraspinal and other soft tissues: Negative for paraspinous mass or adenopathy. Right pleural effusion Disc levels: L1-2: Negative L2-3: Mild retrolisthesis. Diffuse disc bulging and spurring without stenosis. Interval resolution of right foraminal disc protrusion seen on the prior MRI L3-4: Disc degeneration with diffuse disc bulging and spurring. Bilateral facet degeneration. Mild subarticular stenosis bilaterally L4-5: Disc degeneration with diffuse disc bulging and bilateral facet hypertrophy. Mild to moderate spinal stenosis. Moderate subarticular stenosis and foraminal stenosis on the right. L5-S1: Disc bulging and spurring. Left foraminal disc and osteophyte and left foraminal encroachment unchanged from the  prior MRI. 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 No pathologic fracture. Lumbar degenerative changes. Electronically Signed   By: Franchot Gallo M.D.   On: 07/10/2020 14:46   DG Chest Port 1 View  Result Date: 07/11/2020 CLINICAL DATA:  Pleural effusion, status post right thoracentesis. EXAM: PORTABLE CHEST 1 VIEW COMPARISON:  07/09/2020 FINDINGS: Power injectable right Port-A-Cath tip: Right atrium. Reduced right pleural effusion with resolution of the blunting of the right lateral costophrenic angle. No visible pneumothorax. Atherosclerotic calcification of the aortic arch. Fatty bandlike opacity at the right lung base favoring mild residual atelectasis. IMPRESSION: 1. Substantially reduced right pleural effusion, status post right thoracentesis. No visible pneumothorax. Mild atelectasis at the right lung base. 2.  Aortic Atherosclerosis (ICD10-I70.0). Electronically Signed   By: Van Clines M.D.   On: 07/11/2020 11:05   US THORACENTESIS ASP PLEURAL SPACE W/IMG GUIDE  Result Date: 07/11/2020 INDICATION: Patient with a history of non-small cell lung cancer presents today with a right pleural effusion. Interventional radiology asked to perform a therapeutic and diagnostic thoracentesis. EXAM: ULTRASOUND GUIDED THORACENTESIS MEDICATIONS:  1% lidocaine 10 mL COMPLICATIONS: None immediate. PROCEDURE: An ultrasound guided thoracentesis was thoroughly discussed with the patient and questions answered. The benefits, risks, alternatives and complications were also discussed. The patient understands and wishes to proceed with the procedure. Written consent was obtained. Ultrasound was performed to localize and mark an adequate pocket of fluid in the right chest. The area was then prepped and draped in the normal sterile fashion. 1% Lidocaine was used for local anesthesia. Under ultrasound guidance a 6 Fr Safe-T-Centesis catheter was introduced.  Thoracentesis was performed. The catheter was removed and a dressing applied. FINDINGS: A total of approximately 2.5 L of amber-colored/blood-tinged fluid fluid was removed. Samples were sent to the laboratory as requested by the clinical team. IMPRESSION: Successful ultrasound guided right thoracentesis yielding 2.5 L of pleural fluid. Read by: Soyla Dryer, NP Electronically Signed   By: Sandi Mariscal M.D.   On: 07/11/2020 11:08      Assessment/Plan:  Fred Anderson is a 75 y.o. 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

## 2020-07-12 NOTE — Plan of Care (Signed)
Pt is AAOX4. Pt pain was 4 and tolerable. Yellow mews this AM HR 106 and BP 95/55. Charge RN notified will recheck BP in 2 hrs. Bladder scan was done. Intermittent cath output recorded. All needs attended. Call light is within reach. Safety measures maintained. Will continue to monitor.    Problem: Education: Goal: Knowledge of General Education information will improve Description: Including pain rating scale, medication(s)/side effects and non-pharmacologic comfort measures Outcome: Progressing   Problem: Health Behavior/Discharge Planning: Goal: Ability to manage health-related needs will improve Outcome: Progressing   Problem: Clinical Measurements: Goal: Ability to maintain clinical measurements within normal limits will improve Outcome: Progressing Goal: Will remain free from infection Outcome: Progressing Goal: Diagnostic test results will improve Outcome: Progressing Goal: Respiratory complications will improve Outcome: Progressing Goal: Cardiovascular complication will be avoided Outcome: Progressing   Problem: Activity: Goal: Risk for activity intolerance will decrease Outcome: Progressing   Problem: Nutrition: Goal: Adequate nutrition will be maintained Outcome: Progressing   Problem: Coping: Goal: Level of anxiety will decrease Outcome: Progressing   Problem: Elimination: Goal: Will not experience complications related to bowel motility Outcome: Progressing Goal: Will not experience complications related to urinary retention Outcome: Progressing   Problem: Pain Managment: Goal: General experience of comfort will improve Outcome: Progressing   Problem: Safety: Goal: Ability to remain free from injury will improve Outcome: Progressing   Problem: Skin Integrity: Goal: Risk for impaired skin integrity will decrease Outcome: Progressing

## 2020-07-12 NOTE — TOC Progression Note (Signed)
Transition of Care [facility]) - Progression Note    Patient Details  Name: Fred Anderson MRN: [id number] Date of Birth: 10/29/1945  Transition of Care [facility]) CM/SW Contact  Eileen Stanford, LCSW Phone Number: 07/12/2020, 2:21 PM  Clinical Narrative:  CSW has reached out to [facility] agencies and at this time no one is able to service pt. Pt is dc home with spouse.     Expected Discharge Plan: [place] Barriers to Discharge: Continued Medical Work up  Expected Discharge Plan and Services Expected Discharge Plan: [place] In-house Referral: Clinical Social Work   Post Acute Care Choice: Home Health   Expected Discharge Date: 07/12/20                                     Social Determinants of Health (SDOH) Interventions    Readmission Risk Interventions No flowsheet data found.

## 2020-07-12 NOTE — Discharge Summary (Signed)
Fred Anderson IRJ:[id number] DOB: 24-Aug-1945 DOA: 07/09/2020  PCP: Ezequiel Kayser, MD  Admit date: 07/09/2020 Discharge date: 07/12/2020  Time spent: 45 minutes  Recommendations for Outpatient Follow-up:  1. Oncology f/u 1 week, needs sodium checked 2. 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  Per his wife, ptis currently doing chemotherapy, last dose of treatment was 3 weeks ago. He developed generalized weakness inpast several days, which has been progressively worsening. Since this morning, patient has been confused. He has poor appetite and decreased oral intake. Patient has nausea, mostly dry heaves, no vomiting, abdominal pain or diarrhea. Patient denies chest pain, cough, shortness of breath. No symptoms of UTI. No fever or chills. When I saw patient in the emergency room, patient is mildly confused, but is still orientated x3. He moves all extremities. No facial droop or slurred speech.  Patient wasseen byhis oncologist, Dr. Rogue Bussing today, found to have low sodium, andsentto hospital for further evaluation and treatment.  ED Course:pt was found to have sodium 119, WBC 7.3, renal  function okay, negative COVID PCR, temperature normal, blood pressure 149/82, heart rate 100, RR 23, oxygen saturation 94% on room air.Pt isadmitted to progressive bed as inpatient  Hospital Course:  Hyponatremia Acute metabolic encephalopathy Sodium initially 119, patient was given 500 cc normal saline in the ED, sodium decreased to 115. uosm 750, serum osm 253, u sodium 71 - this likely siadh due to pt's lung cancer. AM cortisol wnl. Started on hypertonic saline and sodium improved to 1290. Off hypertonic saline w/ fluid restriction sodium further improved to 132. Patient says weakness has improved somewhat.  - hold celexa - 1 L fluid restriction - f/u oncology next week, consider salt tabs vs loop diuretic if hyponatremia worsens.   BPH with obstruction/lower urinary tract symptoms Acute urinary retention Bladder scan today >999 and pt unable to void, I/o cath yielded 1400 ml. No spinal cord lesion seen on MRI. 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 Currently on chemotherapy, last dose was 3 weeks ago. - palliative involved - rad onc saw here, plan for outpt f/u  Sciatica MRI of spine shows extensive metastatic disease but no cord compression - monitor, consider outpt ortho f/u  Pleural effusion Likely malignant. Therapeutic thora performed - f/u fluid studies  Type 2 diabetes mellitus with neurologic complication, without long-term current use of insulin ([place]): glucose wnl -Sliding scale insulin  HTN (hypertension) Controlled   Procedures:  Therapeutic thoracentesis  Consultations:  Oncology, nephrology,  radiation oncology  Discharge Exam: Vitals:   07/12/20 0613 07/12/20 0954  BP: (!) 95/55 114/70  Pulse: (!) 107 99  Resp: 16 19  Temp: 97.9 F (36.6 C) 98.5 F (36.9 C)  SpO2: 95% 93%    General exam: Appears calm and comfortable  Respiratory system: rales at bases improved Cardiovascular system: S1 & S2 heard, RRR. No JVD, murmurs, rubs, gallops or clicks. No pedal edema. Gastrointestinal system: Abdomen is nondistended, soft and nontender. No organomegaly or masses felt. Normal bowel sounds heard. Central nervous system: Alert and oriented. No focal neurological deficits. Extremities: no edema Skin: No rashes, lesions or ulcers Psychiatry: Judgement and insight appear normal. Mood & affect appropriate.   Discharge Instructions   Discharge Instructions     Remove dressing in 72 hours   Complete by: As directed    Diet - low sodium heart healthy   Complete by: As directed    Increase activity slowly   Complete by: As directed      Allergies as of 07/12/2020      Reactions   Atorvastatin Other (See Comments)   Muscle pain.      Medication List    STOP taking these medications   baclofen 20 MG tablet Commonly known as: LIORESAL   citalopram 20 MG tablet Commonly known as: CELEXA   prochlorperazine 10 MG tablet Commonly known as: COMPAZINE     TAKE these medications   acetaminophen 500 MG tablet Commonly known as: TYLENOL Take 1,000 mg by mouth every 6 (six) hours as needed.   albuterol 108 (90 Base) MCG/ACT inhaler Commonly known as: VENTOLIN HFA Inhale 2 puffs into the lungs every 6 (six) hours as needed for wheezing or shortness of breath.   aspirin 325 MG tablet Take 325 mg by mouth daily.   budesonide-formoterol 160-4.5 MCG/ACT inhaler Commonly known as: Symbicort Inhale 2 puffs into the lungs in the morning and at bedtime. What changed:   when to take this  reasons to take this   chlorpheniramine-HYDROcodone 10-8 MG/5ML Suer Commonly known  as: TUSSIONEX Take 5 mLs by mouth at bedtime as needed for cough.   dexamethasone 4 MG tablet Commonly known as: DECADRON Take one pill AM & PM x 2 days; one day before and one day after chemo. None on the day of chemo.   Fish Oil 500 MG Caps Take 500 mg by mouth 2 (two) times daily.   folic acid 1 MG tablet Commonly known as: FOLVITE Take 1 tablet (1 mg total) by mouth daily.   gabapentin 300 MG capsule Commonly known as: NEURONTIN Start take 1 pill at night x3 days; then one pill twice a day. What changed:   how much to take  how to take this  when to take this   lidocaine-prilocaine cream Commonly known as: EMLA Apply 1 application topically as needed.   lovastatin 40 MG tablet Commonly known as: MEVACOR Take 40 mg by mouth at bedtime.   metFORMIN 500 MG tablet Commonly known as: GLUCOPHAGE Take 1,000 mg by mouth 2 (two) times daily.   multivitamin with minerals Tabs tablet Take 1 tablet by mouth daily.   ondansetron 8 MG tablet Commonly known as: ZOFRAN One pill every 8 hours as needed for nausea/vomitting.   oxyCODONE 5 MG immediate release tablet Commonly known as: Oxy IR/ROXICODONE Take 1 tablet (5 mg total) by mouth every 8 (eight) hours as  needed for severe pain.   polyethylene glycol 17 g packet Commonly known as: MIRALAX / GLYCOLAX Take 17 g by mouth daily. Start taking on: July 13, 2020   tamsulosin 0.4 MG Caps capsule Commonly known as: FLOMAX Take 1 capsule (0.4 mg total) by mouth daily.      Allergies  Allergen Reactions  . Atorvastatin Other (See Comments)    Muscle pain.    Follow-up Information    Cammie Sickle, MD Follow up in 1 week(s).   Specialties: Internal Medicine, Oncology Contact information: [place] [address] [phone]        Zara Council A, PA-C Follow up.   Specialty: Urology Why: call to make an appointment in 1-2 weeks Contact information: [place]  [place] [id number] [phone]                The results of significant diagnostics from this hospitalization (including imaging, microbiology, ancillary and laboratory) are listed below for reference.    Significant Diagnostic Studies: CT HEAD WO CONTRAST  Result Date: 07/09/2020 CLINICAL DATA:  Initial evaluation for acute mental status change, unknown cause. EXAM: CT HEAD WITHOUT CONTRAST TECHNIQUE: Contiguous axial images were obtained from the base of the skull through the vertex without intravenous contrast. COMPARISON:  Previous MRI from 05/09/2020. FINDINGS: Brain: Moderately advanced cerebral atrophy. Associated mild chronic microvascular ischemic disease. No acute intracranial hemorrhage. No acute large vessel territory infarct. No intraparenchymal mass lesion, mass effect, or midline shift. No hydrocephalus or extra-axial fluid collection. Vascular: No hyperdense vessel. Scattered vascular calcifications noted within the carotid siphons. Skull: Scalp soft tissues within normal limits. Calvarium intact. Small probable metastatic lesion involving the clivus is grossly similar to previous MRI. No other visible discrete osseous lesions. Sinuses/Orbits: Globes and orbital soft tissues demonstrate no acute finding. Paranasal sinuses are largely clear. No mastoid effusion. Other: None. IMPRESSION: 1. No acute intracranial abnormality. 2. Moderately advanced cerebral atrophy with mild chronic small vessel ischemic disease. 3. Small probable metastatic lesion involving the clivus, grossly similar to previous MRI. Electronically Signed   By: Jeannine Boga M.D.   On: 07/09/2020 22:33   CT Chest W Contrast  Result Date: 07/06/2020 CLINICAL DATA:  Follow-up metastatic right lung non-small cell carcinoma. Currently undergoing chemotherapy. EXAM: CT CHEST WITH CONTRAST TECHNIQUE: Multidetector CT imaging of the chest was performed during intravenous contrast administration.  CONTRAST:  61mL OMNIPAQUE IOHEXOL 300 MG/ML  SOLN COMPARISON:  PET-CT on 05/15/2019 FINDINGS: Cardiovascular: No acute findings. Aortic and coronary atherosclerotic calcification noted. Mediastinum/Nodes: Mediastinal lymphadenopathy in right paratracheal region remains stable measuring 2.2 cm short axis. Right hilar lymphadenopathy also shows no significant change measuring 2.5 cm. No new or increased sites of lymphadenopathy identified. Lungs/Pleura: Irregular masslike opacity in the central right upper lobe shows no significant change, measuring 4.4 x 2.7 cm on image 59/2. An irregular pulmonary nodule in the anterior right upper lobe shows mild decrease in size, currently measuring 2.0 x 1.8 cm on image 43/3 compared to 2.3 x 2.2 cm previously. Several sub-cm adjacent sub pleural nodules in the anterior right middle lobe remains stable. Focal opacity in the anterior right upper lobe is unchanged and consistent with postobstructive atelectasis or pneumonitis. Moderate to large right pleural effusion shows mild increase in size since previous study. Diffuse pleural based soft tissue nodularity shows no significant change and indicates a malignant pleural effusion. Upper Abdomen:  Unremarkable. Musculoskeletal:  No suspicious bone lesions. IMPRESSION: Slight decrease in  size of anterior right upper lobe pulmonary nodule. Stable masslike opacity in central right upper lobe, and stable right hilar and right paratracheal lymphadenopathy. Stable opacity in medial right upper lobe, likely due to postobstructive atelectasis or pneumonitis. Mild increase in size of malignant right pleural effusion. Aortic Atherosclerosis (ICD10-I70.0). Electronically Signed   By: Marlaine Hind M.D.   On: 07/06/2020 16:51   MR THORACIC SPINE W WO CONTRAST  Result Date: 07/10/2020 CLINICAL DATA:  Metastatic non-small cell lung cancer. Back pain. History of back surgery as a teenager. EXAM: MRI THORACIC WITHOUT AND WITH CONTRAST TECHNIQUE:  Multiplanar and multiecho pulse sequences of the thoracic spine were obtained without and with intravenous contrast. CONTRAST:  32mL GADAVIST GADOBUTROL 1 MMOL/ML IV SOLN COMPARISON:  CT chest 07/05/2020 FINDINGS: Alignment:  Normal Vertebrae: Chronic compression fracture T12 with posterior wire fixation. Widespread metastatic disease throughout the bone marrow of the thoracic spine with all thoracic levels involved. Diffuse rib involvement. No acute fracture. No epidural tumor. Cord:  Negative for cord compression.  Cord signal normal. Paraspinal and other soft tissues: Large right pleural effusion Disc levels: Negative for thoracic spinal stenosis. Small central disc protrusion at T7-8. 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 No pathologic fracture. No cord compression. Large right pleural effusion Chronic fracture T12 with posterior wire fixation. Electronically Signed   By: Franchot Gallo M.D.   On: 07/10/2020 14:41   MR Lumbar Spine W Wo Contrast  Result Date: 07/10/2020 CLINICAL DATA:  Non-small-cell lung cancer. Back pain. History of back surgery as a teenager. EXAM: MRI LUMBAR SPINE WITHOUT AND WITH CONTRAST TECHNIQUE: Multiplanar and multiecho pulse sequences of the lumbar spine were obtained without and with intravenous contrast. CONTRAST:  42mL GADAVIST GADOBUTROL 1 MMOL/ML IV SOLN COMPARISON:  Lumbar MRI 11/01/2009 FINDINGS: Segmentation:  Normal Alignment:  Mild retrolisthesis L2-3 Vertebrae: Widespread skeletal metastatic disease. Numerous small lesions are seen throughout the bone marrow of the lumbar spine, sacrum, and iliac bones. No pathologic fracture. Chronic fracture T12 with posterior wire fixation as noted on thoracic MRI. Conus medullaris and cauda equina: Conus extends to the L1-2 level. Conus and cauda equina appear normal. Paraspinal and other soft tissues: Negative for paraspinous mass or adenopathy.  Right pleural effusion Disc levels: L1-2: Negative L2-3: Mild retrolisthesis. Diffuse disc bulging and spurring without stenosis. Interval resolution of right foraminal disc protrusion seen on the prior MRI L3-4: Disc degeneration with diffuse disc bulging and spurring. Bilateral facet degeneration. Mild subarticular stenosis bilaterally L4-5: Disc degeneration with diffuse disc bulging and bilateral facet hypertrophy. Mild to moderate spinal stenosis. Moderate subarticular stenosis and foraminal stenosis on the right. L5-S1: Disc bulging and spurring. Left foraminal disc and osteophyte and left foraminal encroachment unchanged from the prior MRI. 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 No pathologic fracture. Lumbar degenerative changes. Electronically Signed   By: Franchot Gallo M.D.   On: 07/10/2020 14:46   DG Chest Port 1 View  Result Date: 07/11/2020 CLINICAL DATA:  Pleural effusion, status post right thoracentesis. EXAM: PORTABLE CHEST 1 VIEW COMPARISON:  07/09/2020 FINDINGS: Power injectable right Port-A-Cath tip: Right atrium. Reduced right pleural effusion with resolution of the blunting of the right lateral costophrenic angle. No visible pneumothorax. Atherosclerotic calcification of the aortic arch. Fatty bandlike opacity at the right lung base favoring mild residual atelectasis. IMPRESSION: 1. Substantially reduced right pleural effusion, status post right thoracentesis. No visible pneumothorax. Mild atelectasis at the  right lung base. 2.  Aortic Atherosclerosis (ICD10-I70.0). Electronically Signed   By: Van Clines M.D.   On: 07/11/2020 11:05   DG Chest Portable 1 View  Result Date: 07/09/2020 CLINICAL DATA:  Chemotherapy for lung cancer. EXAM: PORTABLE CHEST 1 VIEW COMPARISON:  05/06/2020 FINDINGS: Power port on the right has its tip at the SVC RA junction. The left chest is clear. There is a sub pulmonic effusion on the right and a  small amount of fluid in the fissure. Volume loss in the right lower lung. 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 IMPRESSION: 1. Subpulmonic effusion on the right with some atelectasis of the right lower lung. 2. 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 Electronically Signed   By: Nelson Chimes M.D.   On: 07/09/2020 11:31   DG HIP UNILAT WITH PELVIS 2-3 VIEWS RIGHT  Result Date: 07/03/2020 CLINICAL DATA:  Right hip and upper leg pain since 06/28/2020. History of lung cancer. No known injury. EXAM: DG HIP (WITH OR WITHOUT PELVIS) 2-3V RIGHT COMPARISON:  None. FINDINGS: There is no evidence of hip fracture or dislocation. Mild degenerative change about the hips appear symmetric from right to left. Lower lumbar spondylosis is incompletely evaluated. Soft tissues are negative. IMPRESSION: No acute abnormality. Mild appearing bilateral hip osteoarthritis. Lower lumbar degenerative change also noted. Electronically Signed   By: Inge Rise M.D.   On: 07/03/2020 16:41   DG FEMUR, MIN 2 VIEWS RIGHT  Result Date: 07/03/2020 CLINICAL DATA:  Right hip and upper leg pain since 06/28/2020. History of lung cancer. No known injury. EXAM: RIGHT FEMUR 2 VIEWS COMPARISON:  None. FINDINGS: There is no evidence of fracture or other focal bone lesions. Soft tissues are unremarkable. IMPRESSION: Negative exam. Electronically Signed   By: Inge Rise M.D.   On: 07/03/2020 16:43   US THORACENTESIS ASP PLEURAL SPACE W/IMG GUIDE  Result Date: 07/11/2020 INDICATION: Patient with a history of non-small cell lung cancer presents today with a right pleural effusion. Interventional radiology asked to perform a therapeutic and diagnostic thoracentesis. EXAM: ULTRASOUND GUIDED THORACENTESIS MEDICATIONS: 1% lidocaine 10 mL COMPLICATIONS: None immediate. PROCEDURE: An ultrasound guided thoracentesis was thoroughly discussed with the patient and questions answered. The benefits, risks, alternatives and  complications were also discussed. The patient understands and wishes to proceed with the procedure. Written consent was obtained. Ultrasound was performed to localize and mark an adequate pocket of fluid in the right chest. The area was then prepped and draped in the normal sterile fashion. 1% Lidocaine was used for local anesthesia. Under ultrasound guidance a 6 Fr Safe-T-Centesis catheter was introduced. Thoracentesis was performed. The catheter was removed and a dressing applied. FINDINGS: A total of approximately 2.5 L of amber-colored/blood-tinged fluid fluid was removed. Samples were sent to the laboratory as requested by the clinical team. IMPRESSION: Successful ultrasound guided right thoracentesis yielding 2.5 L of pleural fluid. Read by: Soyla Dryer, NP Electronically Signed   By: Sandi Mariscal M.D.   On: 07/11/2020 11:08    Microbiology: Recent Results (from the past 240 hour(s))  Resp Panel by RT-PCR (Flu A&B, Covid) Nasopharyngeal Swab     Status: None   Collection Time: 07/09/20 11:03 AM   Specimen: Nasopharyngeal Swab; Nasopharyngeal(NP) swabs in vial transport medium  Result Value Ref Range Status   SARS Coronavirus 2 by RT PCR NEGATIVE NEGATIVE Final    Comment: (NOTE) SARS-CoV-2 target nucleic acids are NOT DETECTED.  The SARS-CoV-2 RNA is generally detectable in upper respiratory specimens during the  acute phase of infection. The lowest concentration of SARS-CoV-2 viral copies this assay can detect is 138 copies/mL. A negative result does not preclude SARS-Cov-2 infection and should not be used as the sole basis for treatment or other patient management decisions. A negative result may occur with  improper specimen collection/handling, submission of specimen other than nasopharyngeal swab, presence of viral mutation(s) within the areas targeted by this assay, and inadequate number of viral copies(<138 copies/mL). A negative result must be combined with clinical  observations, patient history, and epidemiological information. The expected result is Negative.  Fact Sheet for Patients:  EntrepreneurPulse.com.au  Fact Sheet for Healthcare Providers:  IncredibleEmployment.be  This test is no t yet approved or cleared by the [place] FDA and  has been authorized for detection and/or diagnosis of SARS-CoV-2 by FDA under an Emergency Use Authorization (EUA). This EUA will remain  in effect (meaning this test can be used) for the duration of the COVID-19 declaration under Section 564(b)(1) of the Act, 21 U.S.C.section 360bbb-3(b)(1), unless the authorization is terminated  or revoked sooner.       Influenza A by PCR NEGATIVE NEGATIVE Final   Influenza B by PCR NEGATIVE NEGATIVE Final    Comment: (NOTE) The Xpert Xpress SARS-CoV-2/FLU/RSV plus assay is intended as an aid in the diagnosis of influenza from Nasopharyngeal swab specimens and should not be used as a sole basis for treatment. Nasal washings and aspirates are unacceptable for Xpert Xpress SARS-CoV-2/FLU/RSV testing.  Fact Sheet for Patients: EntrepreneurPulse.com.au  Fact Sheet for Healthcare Providers: IncredibleEmployment.be  This test is not yet approved or cleared by the [place] FDA and has been authorized for detection and/or diagnosis of SARS-CoV-2 by FDA under an Emergency Use Authorization (EUA). This EUA will remain in effect (meaning this test can be used) for the duration of the COVID-19 declaration under Section 564(b)(1) of the Act, 21 U.S.C. section 360bbb-3(b)(1), unless the authorization is terminated or revoked.  Performed at [facility], [address] [place], [place] [address]   MRSA PCR Screening     Status: None   Collection Time: 07/09/20  5:25 PM   Specimen: Nasal Mucosa; Nasopharyngeal  Result Value Ref Range Status   MRSA by PCR NEGATIVE NEGATIVE Final     Comment:        The GeneXpert MRSA Assay (FDA approved for NASAL specimens only), is one component of a comprehensive MRSA colonization surveillance program. It is not intended to diagnose MRSA infection nor to guide or monitor treatment for MRSA infections. Performed at [facility], [address] [place], [place] [address]   Body fluid culture w Gram Stain     Status: None (Preliminary result)   Collection Time: 07/11/20 10:00 AM   Specimen: PATH Cytology Pleural fluid  Result Value Ref Range Status   Specimen Description   Final    PLEURAL Performed at [facility], [address], [place], [place] [address]    Special Requests   Final    PLEURAL Performed at [facility], [place],  [address]    Gram Stain NO WBC SEEN NO ORGANISMS SEEN   Final   Culture   Final    NO GROWTH < 24 HOURS Performed at [facility], [place] [address] [place],  [address]    Report Status PENDING  Incomplete     Labs: Basic Metabolic Panel: Recent Labs  Lab 07/09/20 1916 07/09/20 2129 07/10/20 0105 07/10/20 0335 07/10/20  0923 07/10/20 3007 07/11/20 0521 07/11/20 0839 07/11/20 1323 07/11/20 2008 07/12/20 0610 07/12/20 0620  NA 118*   < > 119*   < > 124*   < > 129* 129* 130* 130* 130* 132*  K 4.4  --  4.2  --  4.2  --  4.3  --   --   --   --  4.2  CL 85*  --  87*  --  93*  --  97*  --   --   --   --  99  CO2 25  --  24  --  24  --  25  --   --   --   --  23  GLUCOSE 139*  --  123*  --  111*  --  138*  --   --   --   --  124*  BUN 13  --  12  --  10  --  15  --   --   --   --  14  CREATININE 0.53*  --  0.41*  --  0.43*  --  0.63  --   --   --   --  0.63  CALCIUM 8.7*  --  8.6*  --  8.6*  --  8.9  --   --   --   --  8.9   < > = values in this interval not displayed.   Liver Function Tests: Recent Labs  Lab 07/09/20 0821  AST 48*  ALT 18  ALKPHOS 249*  BILITOT 0.8  PROT 7.0  ALBUMIN 3.4*   No results for  input(s): LIPASE, AMYLASE in the last 168 hours. No results for input(s): AMMONIA in the last 168 hours. CBC: Recent Labs  Lab 07/09/20 0821 07/10/20 0105  WBC 7.3 7.9  NEUTROABS 5.1  --   HGB 12.5* 11.3*  HCT 35.8* 32.1*  MCV 88.2 87.7  PLT 229 191   Cardiac Enzymes: No results for input(s): CKTOTAL, CKMB, CKMBINDEX, TROPONINI in the last 168 hours. BNP: BNP (last 3 results) Recent Labs    05/06/20 0916  BNP 27.8    ProBNP (last 3 results) No results for input(s): PROBNP in the last 8760 hours.  CBG: Recent Labs  Lab 07/09/20 1713  GLUCAP 188*       Signed:  Desma Maxim MD.  [facility] 07/12/2020, 11:48 AM

## 2020-07-13 ENCOUNTER — Telehealth: Payer: Self-pay | Admitting: Internal Medicine

## 2020-07-13 NOTE — Telephone Encounter (Signed)
C-please move the patient's may 6th appointment to 5/03 Tuesday-MD;labs- cbc/bmp [if possible coordinate with Radiation appt]. GB

## 2020-07-14 LAB — BODY FLUID CULTURE W GRAM STAIN
Culture: NO GROWTH
Gram Stain: NONE SEEN

## 2020-07-14 LAB — URINE CULTURE: Culture: NO GROWTH

## 2020-07-14 NOTE — Progress Notes (Signed)
   07/12/20 1834  Assess: MEWS Score  Temp 97.9 F (36.6 C)  BP (!) 95/55  Pulse Rate (!) 107  Resp 16  Level of Consciousness Alert  SpO2 95 %  O2 Device Room Air  Assess: MEWS Score  MEWS Temp 0  MEWS Systolic 1  MEWS Pulse 1  MEWS RR 0  MEWS LOC 0  MEWS Score 2  MEWS Score Color Yellow  Assess: if the MEWS score is Yellow or Red  Were vital signs taken at a resting state? Yes  Focused Assessment No change from prior assessment  Early Detection of Sepsis Score *See Row Information* Low  MEWS guidelines implemented *See Row Information* Yes  Treat  MEWS Interventions Escalated (See documentation below)  Pain Scale 0-10  Pain Score 0  Take Vital Signs  Increase Vital Sign Frequency  Yellow: Q 2hr X 2 then Q 4hr X 2, if remains yellow, continue Q 4hrs  Escalate  MEWS: Escalate Yellow: discuss with charge nurse/RN and consider discussing with provider and RRT  Notify: Charge Nurse/RN  Name of Charge Nurse/RN Notified Crugers, RN  Date Charge Nurse/RN Notified 07/12/20  Time Charge Nurse/RN Notified 0615  Document  Patient Outcome Other (Comment) (continue to monitor)  Inserted for [employer]

## 2020-07-15 ENCOUNTER — Telehealth: Payer: Self-pay | Admitting: Internal Medicine

## 2020-07-15 ENCOUNTER — Other Ambulatory Visit: Payer: Self-pay | Admitting: *Deleted

## 2020-07-15 ENCOUNTER — Telehealth: Payer: Self-pay | Admitting: *Deleted

## 2020-07-15 ENCOUNTER — Other Ambulatory Visit: Payer: Self-pay | Admitting: Internal Medicine

## 2020-07-15 DIAGNOSIS — C3411 Malignant neoplasm of upper lobe, right bronchus or lung: Secondary | ICD-10-CM

## 2020-07-15 LAB — CYTOLOGY - NON PAP

## 2020-07-15 MED ORDER — TRAZODONE HCL 50 MG PO TABS: 50.0000 mg | ORAL_TABLET | Freq: Every evening | ORAL | 1 refills | Status: DC | PRN

## 2020-07-15 NOTE — Telephone Encounter (Signed)
Patient called requesting to cancel his radiation appointment scheduled for 5/3 at 1 pm.  He states he has decided not to receive radiation treatments.  When asked if he was going to keep the appointment to see Dr. Rogue Bussing on 5/3 he states he was not aware that the appointment was changed from 5/6 but he will be here.  Routing to the team for follow up.

## 2020-07-15 NOTE — Telephone Encounter (Signed)
Spoke with pt and he stated will keep his appt with Dr. Jacinto Reap on 5/3 but still wants to cancel his appt with radiation.

## 2020-07-15 NOTE — Telephone Encounter (Signed)
Recommend trazadone qhs; will call script. Hayley- please inform pt/family. GB

## 2020-07-15 NOTE — Telephone Encounter (Signed)
Pt is aware of new prescription sent into pharmacy

## 2020-07-15 NOTE — Telephone Encounter (Signed)
Hayley/Dr. B please advise.

## 2020-07-15 NOTE — Telephone Encounter (Signed)
Lab orders added.

## 2020-07-15 NOTE — Telephone Encounter (Signed)
Pt having difficulty sleeping and requesting something to help him sleep at night. States only sleeps 2-3 hours at a time and unable to go back to sleep once awake.   Please advise.

## 2020-07-16 ENCOUNTER — Inpatient Hospital Stay: Payer: Medicare Other | Attending: Internal Medicine | Admitting: Internal Medicine

## 2020-07-16 ENCOUNTER — Other Ambulatory Visit: Payer: Self-pay

## 2020-07-16 ENCOUNTER — Inpatient Hospital Stay (HOSPITAL_BASED_OUTPATIENT_CLINIC_OR_DEPARTMENT_OTHER): Payer: Medicare Other | Admitting: Hospice and Palliative Medicine

## 2020-07-16 ENCOUNTER — Encounter: Payer: Self-pay | Admitting: Internal Medicine

## 2020-07-16 ENCOUNTER — Inpatient Hospital Stay: Payer: Medicare Other

## 2020-07-16 ENCOUNTER — Ambulatory Visit: Payer: Medicare Other

## 2020-07-16 VITALS — BP 97/61 | HR 123 | Temp 97.6°F | Resp 20 | Ht 71.0 in | Wt 226.6 lb

## 2020-07-16 DIAGNOSIS — M5136 Other intervertebral disc degeneration, lumbar region: Secondary | ICD-10-CM | POA: Diagnosis not present

## 2020-07-16 DIAGNOSIS — R531 Weakness: Secondary | ICD-10-CM | POA: Diagnosis not present

## 2020-07-16 DIAGNOSIS — C3411 Malignant neoplasm of upper lobe, right bronchus or lung: Secondary | ICD-10-CM

## 2020-07-16 DIAGNOSIS — Z66 Do not resuscitate: Secondary | ICD-10-CM | POA: Diagnosis not present

## 2020-07-16 DIAGNOSIS — N400 Enlarged prostate without lower urinary tract symptoms: Secondary | ICD-10-CM | POA: Insufficient documentation

## 2020-07-16 DIAGNOSIS — R0602 Shortness of breath: Secondary | ICD-10-CM | POA: Insufficient documentation

## 2020-07-16 DIAGNOSIS — Z800 Family history of malignant neoplasm of digestive organs: Secondary | ICD-10-CM | POA: Diagnosis not present

## 2020-07-16 DIAGNOSIS — E871 Hypo-osmolality and hyponatremia: Secondary | ICD-10-CM | POA: Insufficient documentation

## 2020-07-16 DIAGNOSIS — G629 Polyneuropathy, unspecified: Secondary | ICD-10-CM | POA: Insufficient documentation

## 2020-07-16 DIAGNOSIS — Z86010 Personal history of colonic polyps: Secondary | ICD-10-CM | POA: Diagnosis not present

## 2020-07-16 DIAGNOSIS — J910 Malignant pleural effusion: Secondary | ICD-10-CM | POA: Insufficient documentation

## 2020-07-16 DIAGNOSIS — M545 Low back pain, unspecified: Secondary | ICD-10-CM | POA: Insufficient documentation

## 2020-07-16 DIAGNOSIS — Z87891 Personal history of nicotine dependence: Secondary | ICD-10-CM | POA: Insufficient documentation

## 2020-07-16 DIAGNOSIS — Z515 Encounter for palliative care: Secondary | ICD-10-CM | POA: Diagnosis not present

## 2020-07-16 DIAGNOSIS — Z79899 Other long term (current) drug therapy: Secondary | ICD-10-CM | POA: Insufficient documentation

## 2020-07-16 DIAGNOSIS — Z7189 Other specified counseling: Secondary | ICD-10-CM | POA: Diagnosis not present

## 2020-07-16 DIAGNOSIS — E785 Hyperlipidemia, unspecified: Secondary | ICD-10-CM | POA: Insufficient documentation

## 2020-07-16 DIAGNOSIS — I700 Atherosclerosis of aorta: Secondary | ICD-10-CM | POA: Insufficient documentation

## 2020-07-16 DIAGNOSIS — Z85820 Personal history of malignant melanoma of skin: Secondary | ICD-10-CM | POA: Diagnosis not present

## 2020-07-16 DIAGNOSIS — C3490 Malignant neoplasm of unspecified part of unspecified bronchus or lung: Secondary | ICD-10-CM | POA: Diagnosis not present

## 2020-07-16 DIAGNOSIS — R630 Anorexia: Secondary | ICD-10-CM | POA: Insufficient documentation

## 2020-07-16 DIAGNOSIS — Z87442 Personal history of urinary calculi: Secondary | ICD-10-CM | POA: Insufficient documentation

## 2020-07-16 DIAGNOSIS — C7951 Secondary malignant neoplasm of bone: Secondary | ICD-10-CM | POA: Insufficient documentation

## 2020-07-16 LAB — BASIC METABOLIC PANEL
Anion gap: 10 (ref 5–15)
BUN: 17 mg/dL (ref 8–23)
CO2: 26 mmol/L (ref 22–32)
Calcium: 9.1 mg/dL (ref 8.9–10.3)
Chloride: 104 mmol/L (ref 98–111)
Creatinine, Ser: 0.73 mg/dL (ref 0.61–1.24)
GFR, Estimated: >60 mL/min (ref >60)
Glucose, Bld: 160 mg/dL — ABNORMAL HIGH (ref 70–99)
Potassium: 4.2 mmol/L (ref 3.5–5.1)
Sodium: 140 mmol/L (ref 135–145)

## 2020-07-16 LAB — CBC WITH DIFFERENTIAL/PLATELET
Abs Immature Granulocytes: 0.17 10*3/uL — ABNORMAL HIGH (ref 0.00–0.07)
Basophils Absolute: 0.1 10*3/uL (ref 0.0–0.1)
Basophils Relative: 1 %
Eosinophils Absolute: 0.1 10*3/uL (ref 0.0–0.5)
Eosinophils Relative: 1 %
HCT: 35.8 % — ABNORMAL LOW (ref 39.0–52.0)
Hemoglobin: 11.7 g/dL — ABNORMAL LOW (ref 13.0–17.0)
Immature Granulocytes: 2 %
Lymphocytes Relative: 23 %
Lymphs Abs: 2.3 10*3/uL (ref 0.7–4.0)
MCH: 30.9 pg (ref 26.0–34.0)
MCHC: 32.7 g/dL (ref 30.0–36.0)
MCV: 94.5 fL (ref 80.0–100.0)
Monocytes Absolute: 1.2 10*3/uL — ABNORMAL HIGH (ref 0.1–1.0)
Monocytes Relative: 12 %
Neutro Abs: 6 10*3/uL (ref 1.7–7.7)
Neutrophils Relative %: 61 %
Platelets: 141 10*3/uL — ABNORMAL LOW (ref 150–400)
RBC: 3.79 MIL/uL — ABNORMAL LOW (ref 4.22–5.81)
RDW: 15.2 % (ref 11.5–15.5)
WBC: 9.8 10*3/uL (ref 4.0–10.5)
nRBC: 0 % (ref 0.0–0.2)

## 2020-07-16 NOTE — Progress Notes (Signed)
Hands on demonstration for pleurx cath provided for patient and his wife. pleurx cath can not be placed at this time due to shortage in pleurx cath supplies per IR dept. However, pt will be contacted once able to schedule the procedure. Patient will be scheduled for u/s thoracentesis while waiting.

## 2020-07-16 NOTE — Assessment & Plan Note (Addendum)
#  Right lung adenocarcinoma/stage IV; s/p 2 cycles of carbo Alimta Keytruda; April 25th CT-stable to improved right upper lobe lung masses; mildly increased right-sided pleural effusion.   #Had a long discussion with patient/family regarding pros and cons of further therapy.  Patient had just had modest response noted on imaging; however similar hyponatremia/fatigue-understandably complications of his therapy.  Continue oxycodone for now  # Severe hyponatremia-[april2022] 119 [sodium]-SIADH; today sodium improved.  Monitor for now if worse would recommend tolvaptan.  Repeat labs in 1 week.  #Back pain/Right hip pain/radiating to right leg with twitching and numbness- sec to malignanyc- MRI T/L spine diffuse metastatic disease;  Long discussion with the patient regarding benefits of radiation outweighing the risk.  Patient declines.  #Had a long discussion with the patient/family regarding pros and cons of therapy; patient after lengthy discussion declines further therapy.  # DISPOSITION: # pleurex catheter placement- right side- ASAP.  # hospice referral # in 1 week- [facility]  # Follow up in 1 week- with Josh Borders-Dr.B  # 40 minutes face-to-face with the patient discussing the above plan of care; more than 50% of time spent on prognosis/ natural history; counseling and coordination.

## 2020-07-16 NOTE — Progress Notes (Signed)
[place] NOTE  Patient Care Team: Ezequiel Kayser, MD as PCP - General (Internal Medicine) Telford Nab, RN as Oncology Nurse Navigator  CHIEF COMPLAINTS/PURPOSE OF CONSULTATION: lung cancer  Oncology History Overview Note  May 07, 2020- CT chest [ER]..Centrally obstructing right upper lobe masses, right upper lobe nodular consolidation, large right pleural effusion and extensive pleural/extrapleural nodularity and mediastinal/right hilar adenopathy, findings most indicative of stage IV primary bronchogenic carcinoma.s/p RIGHT Thoracentesis- MALIGNANT CELLS PRESENT.  - METASTATIC NON SMALL CELL CARCINOMA, FAVOR ADENOCARCINOMA. FEB- 2022- MRI Brain NEG for parenchymal metastases; cervical bone met. PET-MARCH 2022-pleural-based metastases; pleural effusion question bone mets.    # MARCH 10th, 2022- [place]-Alimat [awaiting NGS]; # April 4th, 2022 cycles- carbo-alimta-keytruda  # Enlarged pulmonary arteries, indicative of pulmonary arterial Hypertension.  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; [place]]  # NGS/MOLECULAR TESTS:Guardant testing- ATM *; No other targetable mutation    # PALLIATIVE CARE EVALUATION:  # PAIN MANAGEMENT:    DIAGNOSIS: Lung cancer  STAGE:  IV       ;  GOALS: palliative  CURRENT/MOST RECENT THERAPY : carbo-alimta-keytruda    Cancer of upper lobe of right lung ([place])  05/13/2020 Initial Diagnosis   Cancer of upper lobe of right lung ([place])   05/13/2020 Cancer Staging   Staging form: Lung, AJCC 8th Edition - Clinical: Stage IVA (cT2, cN3, pM1b) - Signed by Cammie Sickle, MD on 05/13/2020 Histopathologic type: Adenocarcinoma, NOS   05/23/2020 -  Chemotherapy    Patient is on Treatment Plan: LUNG CARBOPLATIN / PEMETREXED / PEMBROLIZUMAB Q21D INDUCTION X 4 CYCLES / MAINTENANCE PEMETREXED + PEMBROLIZUMAB         HISTORY OF PRESENTING ILLNESS:  Fred Anderson 75 y.o.  male history lung cancer -adenocarcinoma stage IV  -currently on [place] Alimta-keytruda chemotherapy s/p cycle #2 is here for follow-up.  Patient was recently admitted to hospital/ICU-severe hyponatremia sodium 119.  Patient also had thoracentesis done.  Patient also underwent MRI of his back for his ongoing pain-that showed multiple sclerotic lesion.  Patient was evaluated by radiation oncology-recommended radiation  Patient continues to feel weak.  Complains of generalized weakness.  Shortness of breath on exertion.  Poor appetite.  Continues to complain of back pain/radiating to his right leg.   Review of Systems  Constitutional: Positive for malaise/fatigue and weight loss. Negative for chills, diaphoresis and fever.  HENT: Negative for nosebleeds and sore throat.   Eyes: Negative for double vision.  Respiratory: Positive for cough and sputum production. Negative for hemoptysis, shortness of breath and wheezing.   Cardiovascular: Positive for chest pain. Negative for palpitations, orthopnea and leg swelling.  Gastrointestinal: Negative for abdominal pain, blood in stool, constipation, heartburn, melena, nausea and vomiting.  Genitourinary: Negative for dysuria, frequency and urgency.  Musculoskeletal: Positive for back pain and joint pain.  Skin: Negative.  Negative for itching and rash.  Neurological: Negative for dizziness, tingling, focal weakness and weakness.  Endo/Heme/Allergies: Does not bruise/bleed easily.  Psychiatric/Behavioral: Negative for depression. The patient is not nervous/anxious and does not have insomnia.      MEDICAL HISTORY:  Past Medical History:  Diagnosis Date  . Arthritis   . Blood in semen   . BPH (benign prostatic hypertrophy)   . Cancer ([place])    melanoma on back  . DDD (degenerative disc disease), lumbar   . Diabetes mellitus without complication ([place])    TYPE 2  . Erectile dysfunction   . Hematospermia   . Hemorrhoids   .  History of adenomatous polyp of colon   . History of kidney stones   .  Hyperlipidemia   . Hypertension   . Kidney stones   . Obesity   . 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[place])   . Prediabetes     SURGICAL HISTORY: Past Surgical History:  Procedure Laterality Date  . BACK SURGERY  age 80   Broken back  . COLONOSCOPY WITH PROPOFOL N/A 05/05/2017   Procedure: COLONOSCOPY WITH PROPOFOL;  Surgeon: Toledo, Benay Pike, MD;  Location: [facility] ENDOSCOPY;  Service: Gastroenterology;  Laterality: N/A;  . HEMORRHOID SURGERY    . IR IMAGING GUIDED PORT INSERTION  05/17/2020  . LAPAROSCOPIC APPENDECTOMY N/A 04/25/2018   Procedure: APPENDECTOMY LAPAROSCOPIC;  Surgeon: Herbert Pun, MD;  Location: [facility] ORS;  Service: General;  Laterality: N/A;  . SHOULDER ARTHROSCOPY WITH OPEN ROTATOR CUFF REPAIR Right 04/03/2019   Procedure: SHOULDER ARTHROSCOPY WITH SUBSACAPULARIS REPAIR, OPEN ROTATOR CUFF REPAIR, SUBARCOMIAL DECOMPRESSION, BICEPS TENODESIS;  Surgeon: Leim Fabry, MD;  Location: [facility] ORS;  Service: Orthopedics;  Laterality: Right;  . SMALL INTESTINE SURGERY      SOCIAL HISTORY: Social History   Socioeconomic History  . Marital status: Married    Spouse name: Not on file  . Number of children: Not on file  . Years of education: Not on file  . Highest education level: Not on file  Occupational History  . Not on file  Tobacco Use  . Smoking status: Former Smoker    Quit date: 08/15/1988    Years since quitting: 31.9  . Smokeless tobacco: Never Used  . Tobacco comment: quit september 11th 1991  Vaping Use  . Vaping Use: Never used  Substance and Sexual Activity  . Alcohol use: Yes    Alcohol/week: 12.0 standard drinks    Types: 12 Cans of beer per week  . Drug use: No  . Sexual activity: Not on file  Other Topics Concern  . Not on file  Social History Narrative   Quit smoking in 1991; used to smoke 3 ppd. Lives in [place]; with wife; and 2 daughters. 2-3 drink 4/ week. Worked in Tourist information centre manager; retired in 2013; raised cattle.     Social Determinants of Health   Financial Resource Strain: Not on file  Food Insecurity: Not on file  Transportation Needs: Not on file  Physical Activity: Not on file  Stress: Not on file  Social Connections: Not on file  Intimate Partner Violence: Not on file    FAMILY HISTORY: Family History  Problem Relation Age of Onset  . Colon cancer Father   . Cancer Mother   . Liver cancer Sister   . Kidney cancer Brother   . Bone cancer Brother   . Kidney disease Neg Hx   . Prostate cancer Neg Hx   . Bladder Cancer Neg Hx     ALLERGIES:  is allergic to atorvastatin.  MEDICATIONS:  Current Outpatient Medications  Medication Sig Dispense Refill  . acetaminophen (TYLENOL) 500 MG tablet Take 1,000 mg by mouth every 6 (six) hours as needed.    Marland Kitchen albuterol (VENTOLIN HFA) 108 (90 Base) MCG/ACT inhaler Inhale 2 puffs into the lungs every 6 (six) hours as needed for wheezing or shortness of breath. 1 each 2  . aspirin 325 MG tablet Take 325 mg by mouth daily.    . budesonide-formoterol (SYMBICORT) 160-4.5 MCG/ACT inhaler Inhale 2 puffs into the lungs in the morning and at bedtime. (Patient taking differently: Inhale 2 puffs into the lungs  2 (two) times daily as needed.) 1 each 3  . chlorpheniramine-HYDROcodone (TUSSIONEX) 10-8 MG/5ML SUER Take 5 mLs by mouth at bedtime as needed for cough. 741 mL 0  . folic acid (FOLVITE) 1 MG tablet Take 1 tablet (1 mg total) by mouth daily. 90 tablet 1  . lidocaine-prilocaine (EMLA) cream Apply 1 application topically as needed. 30 g 0  . lovastatin (MEVACOR) 40 MG tablet Take 40 mg by mouth at bedtime.   0  . metFORMIN (GLUCOPHAGE) 500 MG tablet Take 1,000 mg by mouth 2 (two) times daily.    . Multiple Vitamin (MULTIVITAMIN WITH MINERALS) TABS tablet Take 1 tablet by mouth daily.    . Omega-3 Fatty Acids (FISH OIL) 500 MG CAPS Take 500 mg by mouth 2 (two) times daily.     . ondansetron (ZOFRAN) 8 MG tablet One pill every 8 hours as needed for  nausea/vomitting. 40 tablet 1  . polyethylene glycol (MIRALAX / GLYCOLAX) 17 g packet Take 17 g by mouth daily. 90 each 0  . tamsulosin (FLOMAX) 0.4 MG CAPS capsule Take 1 capsule (0.4 mg total) by mouth daily. 90 capsule 3  . traZODone (DESYREL) 50 MG tablet Take 1 tablet (50 mg total) by mouth at bedtime as needed for sleep. 30 tablet 1  . dexamethasone (DECADRON) 4 MG tablet Take one pill AM & PM x 2 days; one day before and one day after chemo. None on the day of chemo. 60 tablet 0  . gabapentin (NEURONTIN) 300 MG capsule Take 1 capsule (300 mg total) by mouth 3 (three) times daily. 90 capsule 0  . oxyCODONE (OXY IR/ROXICODONE) 5 MG immediate release tablet Take 1 tablet (5 mg total) by mouth every 6 (six) hours as needed for severe pain. 45 tablet 0   No current facility-administered medications for this visit.      Marland Kitchen  PHYSICAL EXAMINATION: ECOG PERFORMANCE STATUS: 1 - Symptomatic but completely ambulatory  Vitals:   07/16/20 1335  BP: 97/61  Pulse: (!) 123  Resp: 20  Temp: 97.6 F (36.4 C)  SpO2: 95%   Filed Weights   07/16/20 1335  Weight: 226 lb 9.6 oz (102.8 kg)    Physical Exam Constitutional:      Comments: Ambulating independently.  Accompanied by his wife.  HENT:     Head: Normocephalic and atraumatic.     Mouth/Throat:     Pharynx: No oropharyngeal exudate.  Eyes:     Pupils: Pupils are equal, round, and reactive to light.  Cardiovascular:     Rate and Rhythm: Normal rate and regular rhythm.  Pulmonary:     Effort: No respiratory distress.     Breath sounds: No wheezing.     Comments: Decreased breath on the right side compared to the left.  No wheeze or crackles. Abdominal:     General: Bowel sounds are normal. There is no distension.     Palpations: Abdomen is soft. There is no mass.     Tenderness: There is no abdominal tenderness. There is no guarding or rebound.  Musculoskeletal:        General: No tenderness. Normal range of motion.     Cervical  back: Normal range of motion and neck supple.  Skin:    General: Skin is warm.  Neurological:     Mental Status: He is alert and oriented to person, place, and time.  Psychiatric:        Mood and Affect: Affect normal.  LABORATORY DATA:  I have reviewed the data as listed Lab Results  Component Value Date   WBC 7.4 07/23/2020   HGB 11.6 (L) 07/23/2020   HCT 36.5 (L) 07/23/2020   MCV 94.8 07/23/2020   PLT 217 07/23/2020   Recent Labs    06/25/20 1441 07/09/20 0821 07/09/20 1150 07/12/20 0620 07/12/20 1147 07/16/20 1312 07/23/20 1008  NA 136 119*   < > 132* 130* 140 138  K 4.5 4.6   < > 4.2  --  4.2 4.3  CL 102 84*   < > 99  --  104 104  CO2 23 23   < > 23  --  26 23  GLUCOSE 117* 174*   < > 124*  --  160* 122*  BUN 23 18   < > 14  --  17 17  CREATININE 0.77 0.62   < > 0.63  --  0.73 0.86  CALCIUM 8.8* 9.0   < > 8.9  --  9.1 9.1  GFRNONAA >60 >60   < > >60  --  >60 >60  PROT 7.0 7.0  --   --   --   --  6.9  ALBUMIN 3.3* 3.4*  --   --   --   --  2.8*  AST 45* 48*  --   --   --   --  35  ALT 44 18  --   --   --   --  34  ALKPHOS 160* 249*  --   --   --   --  211*  BILITOT 0.5 0.8  --   --   --   --  0.4   < > = values in this interval not displayed.    RADIOGRAPHIC STUDIES: I have personally reviewed the radiological images as listed and agreed with the findings in the report. DG Chest 1 View  Result Date: 07/18/2020 CLINICAL DATA:  Status post right thoracentesis. EXAM: CHEST  1 VIEW COMPARISON:  07/11/2020 FINDINGS: Stable right jugular porta catheter with its tip in the right atrium. Clear lungs. Stable small amount of right lateral pleural thickening or fluid. No pneumothorax. Thoracic spine degenerative changes. IMPRESSION: No pneumothorax following thoracentesis. Electronically Signed   By: Claudie Revering M.D.   On: 07/18/2020 15:06   CT HEAD WO CONTRAST  Result Date: 07/09/2020 CLINICAL DATA:  Initial evaluation for acute mental status change, unknown  cause. EXAM: CT HEAD WITHOUT CONTRAST TECHNIQUE: Contiguous axial images were obtained from the base of the skull through the vertex without intravenous contrast. COMPARISON:  Previous MRI from 05/09/2020. FINDINGS: Brain: Moderately advanced cerebral atrophy. Associated mild chronic microvascular ischemic disease. No acute intracranial hemorrhage. No acute large vessel territory infarct. No intraparenchymal mass lesion, mass effect, or midline shift. No hydrocephalus or extra-axial fluid collection. Vascular: No hyperdense vessel. Scattered vascular calcifications noted within the carotid siphons. Skull: Scalp soft tissues within normal limits. Calvarium intact. Small probable metastatic lesion involving the clivus is grossly similar to previous MRI. No other visible discrete osseous lesions. Sinuses/Orbits: Globes and orbital soft tissues demonstrate no acute finding. Paranasal sinuses are largely clear. No mastoid effusion. Other: None. IMPRESSION: 1. No acute intracranial abnormality. 2. Moderately advanced cerebral atrophy with mild chronic small vessel ischemic disease. 3. Small probable metastatic lesion involving the clivus, grossly similar to previous MRI. Electronically Signed   By: Jeannine Boga M.D.   On: 07/09/2020 22:33   CT Chest W Contrast  Result Date: 07/06/2020  CLINICAL DATA:  Follow-up metastatic right lung non-small cell carcinoma. Currently undergoing chemotherapy. EXAM: CT CHEST WITH CONTRAST TECHNIQUE: Multidetector CT imaging of the chest was performed during intravenous contrast administration. CONTRAST:  84m OMNIPAQUE IOHEXOL 300 MG/ML  SOLN COMPARISON:  PET-CT on 05/15/2019 FINDINGS: Cardiovascular: No acute findings. Aortic and coronary atherosclerotic calcification noted. Mediastinum/Nodes: Mediastinal lymphadenopathy in right paratracheal region remains stable measuring 2.2 cm short axis. Right hilar lymphadenopathy also shows no significant change measuring 2.5 cm. No new  or increased sites of lymphadenopathy identified. Lungs/Pleura: Irregular masslike opacity in the central right upper lobe shows no significant change, measuring 4.4 x 2.7 cm on image 59/2. An irregular pulmonary nodule in the anterior right upper lobe shows mild decrease in size, currently measuring 2.0 x 1.8 cm on image 43/3 compared to 2.3 x 2.2 cm previously. Several sub-cm adjacent sub pleural nodules in the anterior right middle lobe remains stable. Focal opacity in the anterior right upper lobe is unchanged and consistent with postobstructive atelectasis or pneumonitis. Moderate to large right pleural effusion shows mild increase in size since previous study. Diffuse pleural based soft tissue nodularity shows no significant change and indicates a malignant pleural effusion. Upper Abdomen:  Unremarkable. Musculoskeletal:  No suspicious bone lesions. IMPRESSION: Slight decrease in size of anterior right upper lobe pulmonary nodule. Stable masslike opacity in central right upper lobe, and stable right hilar and right paratracheal lymphadenopathy. Stable opacity in medial right upper lobe, likely due to postobstructive atelectasis or pneumonitis. Mild increase in size of malignant right pleural effusion. Aortic Atherosclerosis (ICD10-I70.0). Electronically Signed   By: JMarlaine HindM.D.   On: 07/06/2020 16:51   MR THORACIC SPINE W WO CONTRAST  Result Date: 07/10/2020 CLINICAL DATA:  Metastatic non-small cell lung cancer. Back pain. History of back surgery as a teenager. EXAM: MRI THORACIC WITHOUT AND WITH CONTRAST TECHNIQUE: Multiplanar and multiecho pulse sequences of the thoracic spine were obtained without and with intravenous contrast. CONTRAST:  168mGADAVIST GADOBUTROL 1 MMOL/ML IV SOLN COMPARISON:  CT chest 07/05/2020 FINDINGS: Alignment:  Normal Vertebrae: Chronic compression fracture T12 with posterior wire fixation. Widespread metastatic disease throughout the bone marrow of the thoracic spine with  all thoracic levels involved. Diffuse rib involvement. No acute fracture. No epidural tumor. Cord:  Negative for cord compression.  Cord signal normal. Paraspinal and other soft tissues: Large right pleural effusion Disc levels: Negative for thoracic spinal stenosis. Small central disc protrusion at T7-8. 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 No pathologic fracture. No cord compression. Large right pleural effusion Chronic fracture T12 with posterior wire fixation. Electronically Signed   By: ChFranchot Gallo.D.   On: 07/10/2020 14:41   MR Lumbar Spine W Wo Contrast  Result Date: 07/10/2020 CLINICAL DATA:  Non-small-cell lung cancer. Back pain. History of back surgery as a teenager. EXAM: MRI LUMBAR SPINE WITHOUT AND WITH CONTRAST TECHNIQUE: Multiplanar and multiecho pulse sequences of the lumbar spine were obtained without and with intravenous contrast. CONTRAST:  1029mADAVIST GADOBUTROL 1 MMOL/ML IV SOLN COMPARISON:  Lumbar MRI 11/01/2009 FINDINGS: Segmentation:  Normal Alignment:  Mild retrolisthesis L2-3 Vertebrae: Widespread skeletal metastatic disease. Numerous small lesions are seen throughout the bone marrow of the lumbar spine, sacrum, and iliac bones. No pathologic fracture. Chronic fracture T12 with posterior wire fixation as noted on thoracic MRI. Conus medullaris and cauda equina: Conus extends to the L1-2 level. Conus and cauda equina appear normal. Paraspinal and other soft tissues:  Negative for paraspinous mass or adenopathy. Right pleural effusion Disc levels: L1-2: Negative L2-3: Mild retrolisthesis. Diffuse disc bulging and spurring without stenosis. Interval resolution of right foraminal disc protrusion seen on the prior MRI L3-4: Disc degeneration with diffuse disc bulging and spurring. Bilateral facet degeneration. Mild subarticular stenosis bilaterally L4-5: Disc degeneration with diffuse disc bulging and  bilateral facet hypertrophy. Mild to moderate spinal stenosis. Moderate subarticular stenosis and foraminal stenosis on the right. L5-S1: Disc bulging and spurring. Left foraminal disc and osteophyte and left foraminal encroachment unchanged from the prior MRI. 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 No pathologic fracture. Lumbar degenerative changes. Electronically Signed   By: Franchot Gallo M.D.   On: 07/10/2020 14:46   DG Chest Port 1 View  Result Date: 07/11/2020 CLINICAL DATA:  Pleural effusion, status post right thoracentesis. EXAM: PORTABLE CHEST 1 VIEW COMPARISON:  07/09/2020 FINDINGS: Power injectable right Port-A-Cath tip: Right atrium. Reduced right pleural effusion with resolution of the blunting of the right lateral costophrenic angle. No visible pneumothorax. Atherosclerotic calcification of the aortic arch. Fatty bandlike opacity at the right lung base favoring mild residual atelectasis. IMPRESSION: 1. Substantially reduced right pleural effusion, status post right thoracentesis. No visible pneumothorax. Mild atelectasis at the right lung base. 2.  Aortic Atherosclerosis (ICD10-I70.0). Electronically Signed   By: Van Clines M.D.   On: 07/11/2020 11:05   DG Chest Portable 1 View  Result Date: 07/09/2020 CLINICAL DATA:  Chemotherapy for lung cancer. EXAM: PORTABLE CHEST 1 VIEW COMPARISON:  05/06/2020 FINDINGS: Power port on the right has its tip at the SVC RA junction. The left chest is clear. There is a sub pulmonic effusion on the right and a small amount of fluid in the fissure. Volume loss in the right lower lung. 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 IMPRESSION: 1. Subpulmonic effusion on the right with some atelectasis of the right lower lung. 2. 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 Electronically Signed   By: Nelson Chimes M.D.   On: 07/09/2020 11:31   DG HIP UNILAT WITH PELVIS 2-3 VIEWS  RIGHT  Result Date: 07/03/2020 CLINICAL DATA:  Right hip and upper leg pain since 06/28/2020. History of lung cancer. No known injury. EXAM: DG HIP (WITH OR WITHOUT PELVIS) 2-3V RIGHT COMPARISON:  None. FINDINGS: There is no evidence of hip fracture or dislocation. Mild degenerative change about the hips appear symmetric from right to left. Lower lumbar spondylosis is incompletely evaluated. Soft tissues are negative. IMPRESSION: No acute abnormality. Mild appearing bilateral hip osteoarthritis. Lower lumbar degenerative change also noted. Electronically Signed   By: Inge Rise M.D.   On: 07/03/2020 16:41   DG FEMUR, MIN 2 VIEWS RIGHT  Result Date: 07/03/2020 CLINICAL DATA:  Right hip and upper leg pain since 06/28/2020. History of lung cancer. No known injury. EXAM: RIGHT FEMUR 2 VIEWS COMPARISON:  None. FINDINGS: There is no evidence of fracture or other focal bone lesions. Soft tissues are unremarkable. IMPRESSION: Negative exam. Electronically Signed   By: Inge Rise M.D.   On: 07/03/2020 16:43   US THORACENTESIS ASP PLEURAL SPACE W/IMG GUIDE  Result Date: 07/18/2020 INDICATION: Recurrent malignant right-sided pleural effusion. Request therapeutic thoracentesis. EXAM: ULTRASOUND GUIDED RIGHT THORACENTESIS MEDICATIONS: 1% plain lidocaine, 5 mL COMPLICATIONS: None immediate. PROCEDURE: An ultrasound guided thoracentesis was thoroughly discussed with the patient and questions answered. The benefits, risks, alternatives and complications were also discussed. The patient understands and wishes to proceed with the procedure. Written consent  was obtained. Ultrasound was performed to localize and mark an adequate pocket of fluid in the right chest. The area was then prepped and draped in the normal sterile fashion. 1% Lidocaine was used for local anesthesia. Under ultrasound guidance a 6 Fr Safe-T-Centesis catheter was introduced. Thoracentesis was performed. The catheter was removed and a dressing  applied. FINDINGS: A total of approximately 1.5 L of bloody pleural fluid was removed. IMPRESSION: Successful ultrasound guided right thoracentesis yielding 1.5 L of pleural fluid. Read by: Ascencion Dike PA-C Electronically Signed   By: Aletta Edouard M.D.   On: 07/18/2020 15:07   US THORACENTESIS ASP PLEURAL SPACE W/IMG GUIDE  Result Date: 07/11/2020 INDICATION: Patient with a history of non-small cell lung cancer presents today with a right pleural effusion. Interventional radiology asked to perform a therapeutic and diagnostic thoracentesis. EXAM: ULTRASOUND GUIDED THORACENTESIS MEDICATIONS: 1% lidocaine 10 mL COMPLICATIONS: None immediate. PROCEDURE: An ultrasound guided thoracentesis was thoroughly discussed with the patient and questions answered. The benefits, risks, alternatives and complications were also discussed. The patient understands and wishes to proceed with the procedure. Written consent was obtained. Ultrasound was performed to localize and mark an adequate pocket of fluid in the right chest. The area was then prepped and draped in the normal sterile fashion. 1% Lidocaine was used for local anesthesia. Under ultrasound guidance a 6 Fr Safe-T-Centesis catheter was introduced. Thoracentesis was performed. The catheter was removed and a dressing applied. FINDINGS: A total of approximately 2.5 L of amber-colored/blood-tinged fluid fluid was removed. Samples were sent to the laboratory as requested by the clinical team. IMPRESSION: Successful ultrasound guided right thoracentesis yielding 2.5 L of pleural fluid. Read by: Soyla Dryer, NP Electronically Signed   By: Sandi Mariscal M.D.   On: 07/11/2020 11:08    ASSESSMENT & PLAN:   Cancer of upper lobe of right lung ([place]) #Right lung adenocarcinoma/stage IV; s/p 2 cycles of carbo Alimta Keytruda; April 25th CT-stable to improved right upper lobe lung masses; mildly increased right-sided pleural effusion.   #Had a long discussion with  patient/family regarding pros and cons of further therapy.  Patient had just had modest response noted on imaging; however similar hyponatremia/fatigue-understandably complications of his therapy.  Continue oxycodone for now  # Severe hyponatremia-[april2022] 119 [sodium]-SIADH; today sodium improved.  Monitor for now if worse would recommend tolvaptan.  Repeat labs in 1 week.  #Back pain/Right hip pain/radiating to right leg with twitching and numbness- sec to malignanyc- MRI T/L spine diffuse metastatic disease;  Long discussion with the patient regarding benefits of radiation outweighing the risk.  Patient declines.  #Had a long discussion with the patient/family regarding pros and cons of therapy; patient after lengthy discussion declines further therapy.  # DISPOSITION: # pleurex catheter placement- right side- ASAP.  # hospice referral # in 1 week- [facility]  # Follow up in 1 week- with Josh [facility]  # 40 minutes face-to-face with the patient discussing the above plan of care; more than 50% of time spent on prognosis/ natural history; counseling and coordination.     All questions were answered. The patient knows to call the clinic with any problems, questions or concerns.    Cammie Sickle, MD 07/23/2020 6:53 PM

## 2020-07-16 NOTE — Progress Notes (Signed)
Fred Anderson  Telephone:([phone] [phone]   Name: Fred Anderson Date: 07/16/2020 MRN: [id number]  DOB: June 06, 1945  Patient Care Team: Ezequiel Kayser, MD as PCP - General (Internal Medicine) Telford Nab, RN as Oncology Nurse Navigator    REASON FOR CONSULTATION: Fred Anderson is a 75 y.o. male with multiple medical problems stage IV non-small cell lung cancer diagnosed in February 2022 on systemic treatment with carbo/pemetrexed/pembrolizumab.  Patient was hospitalized 07/09/2020-07/13/2018 with progressive weakness and altered mental status.  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  Palliative care was consulted help address goals.  SOCIAL HISTORY:     reports that he quit smoking about 31 years ago. He has never used smokeless tobacco. He reports current alcohol use of about 12.0 standard drinks of alcohol per week. He reports that he does not use drugs.  Patient is married and lives at home with his wife and daughter.  He has 4 adult children.  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  ADVANCE DIRECTIVES:  None on file  CODE STATUS: DNR/DNI (DNR order signed on 07/16/20)  PAST MEDICAL HISTORY: Past Medical History:  Diagnosis Date  . Arthritis   . Blood in semen   . BPH (benign prostatic hypertrophy)   . Cancer ([place])    melanoma on back  . DDD (degenerative disc disease), lumbar   . Diabetes mellitus without complication ([place])    TYPE 2  . Erectile dysfunction   . Hematospermia   . Hemorrhoids   . History of adenomatous polyp of colon   . History of kidney stones   . Hyperlipidemia   . Hypertension   . Kidney stones   . Obesity   . 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[place])   . Prediabetes     PAST SURGICAL HISTORY:  Past Surgical History:  Procedure Laterality Date  . BACK SURGERY  age 53   Broken back  . COLONOSCOPY WITH PROPOFOL N/A 05/05/2017    Procedure: COLONOSCOPY WITH PROPOFOL;  Surgeon: Toledo, Benay Pike, MD;  Location: [facility] ENDOSCOPY;  Service: Gastroenterology;  Laterality: N/A;  . HEMORRHOID SURGERY    . IR IMAGING GUIDED PORT INSERTION  05/17/2020  . LAPAROSCOPIC APPENDECTOMY N/A 04/25/2018   Procedure: APPENDECTOMY LAPAROSCOPIC;  Surgeon: Herbert Pun, MD;  Location: [facility] ORS;  Service: General;  Laterality: N/A;  . SHOULDER ARTHROSCOPY WITH OPEN ROTATOR CUFF REPAIR Right 04/03/2019   Procedure: SHOULDER ARTHROSCOPY WITH SUBSACAPULARIS REPAIR, OPEN ROTATOR CUFF REPAIR, SUBARCOMIAL DECOMPRESSION, BICEPS TENODESIS;  Surgeon: Leim Fabry, MD;  Location: [facility] ORS;  Service: Orthopedics;  Laterality: Right;  . SMALL INTESTINE SURGERY      HEMATOLOGY/ONCOLOGY HISTORY:  Oncology History Overview Note  May 07, 2020- CT chest [ER]..Centrally obstructing right upper lobe masses, right upper lobe nodular consolidation, large right pleural effusion and extensive pleural/extrapleural nodularity and mediastinal/right hilar adenopathy, findings most indicative of stage IV primary bronchogenic carcinoma.s/p RIGHT Thoracentesis- MALIGNANT CELLS PRESENT.  - METASTATIC NON SMALL CELL CARCINOMA, FAVOR ADENOCARCINOMA. FEB- 2022- MRI Brain NEG for parenchymal metastases; cervical bone met. PET-MARCH 2022-pleural-based metastases; pleural effusion question bone mets.    # MARCH 10th, 2022- [place]-Alimat [awaiting NGS]; # April 4th, 2022 cycles- carbo-alimta-keytruda  # Enlarged pulmonary arteries, indicative of pulmonary arterial Hypertension.  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; [place]]  # NGS/MOLECULAR TESTS:Guardant testing- ATM *; No other targetable mutation    # PALLIATIVE CARE EVALUATION:  # PAIN MANAGEMENT:  DIAGNOSIS: Lung cancer  STAGE:  IV       ;  GOALS: palliative  CURRENT/MOST RECENT THERAPY : carbo-alimta-keytruda    Cancer of upper lobe of right lung ([place])  05/13/2020 Initial Diagnosis   Cancer of upper  lobe of right lung ([place])   05/13/2020 Cancer Staging   Staging form: Lung, AJCC 8th Edition - Clinical: Stage IVA (cT2, cN3, pM1b) - Signed by Cammie Sickle, MD on 05/13/2020 Histopathologic type: Adenocarcinoma, NOS   05/23/2020 -  Chemotherapy    Patient is on Treatment Plan: LUNG CARBOPLATIN / PEMETREXED / PEMBROLIZUMAB Q21D INDUCTION X 4 CYCLES / MAINTENANCE PEMETREXED + PEMBROLIZUMAB        ALLERGIES:  is allergic to atorvastatin.  MEDICATIONS:  Current Outpatient Medications  Medication Sig Dispense Refill  . acetaminophen (TYLENOL) 500 MG tablet Take 1,000 mg by mouth every 6 (six) hours as needed.    Marland Kitchen albuterol (VENTOLIN HFA) 108 (90 Base) MCG/ACT inhaler Inhale 2 puffs into the lungs every 6 (six) hours as needed for wheezing or shortness of breath. 1 each 2  . aspirin 325 MG tablet Take 325 mg by mouth daily.    . budesonide-formoterol (SYMBICORT) 160-4.5 MCG/ACT inhaler Inhale 2 puffs into the lungs in the morning and at bedtime. (Patient taking differently: Inhale 2 puffs into the lungs 2 (two) times daily as needed.) 1 each 3  . chlorpheniramine-HYDROcodone (TUSSIONEX) 10-8 MG/5ML SUER Take 5 mLs by mouth at bedtime as needed for cough. 140 mL 0  . dexamethasone (DECADRON) 4 MG tablet Take one pill AM & PM x 2 days; one Anderson before and one Anderson after chemo. None on the Anderson of chemo. (Patient not taking: Reported on 07/16/2020) 60 tablet 0  . folic acid (FOLVITE) 1 MG tablet Take 1 tablet (1 mg total) by mouth daily. 90 tablet 1  . gabapentin (NEURONTIN) 300 MG capsule Start take 1 pill at night x3 days; then one pill twice a Anderson. (Patient taking differently: Take 300 mg by mouth 3 (three) times daily. Start take 1 pill at night x3 days; then one pill twice a Anderson.) 60 capsule 0  . lidocaine-prilocaine (EMLA) cream Apply 1 application topically as needed. 30 g 0  . lovastatin (MEVACOR) 40 MG tablet Take 40 mg by mouth at bedtime.   0  . metFORMIN (GLUCOPHAGE) 500 MG tablet  Take 1,000 mg by mouth 2 (two) times daily.    . Multiple Vitamin (MULTIVITAMIN WITH MINERALS) TABS tablet Take 1 tablet by mouth daily.    . Omega-3 Fatty Acids (FISH OIL) 500 MG CAPS Take 500 mg by mouth 2 (two) times daily.     . ondansetron (ZOFRAN) 8 MG tablet One pill every 8 hours as needed for nausea/vomitting. 40 tablet 1  . oxyCODONE (OXY IR/ROXICODONE) 5 MG immediate release tablet Take 1 tablet (5 mg total) by mouth every 8 (eight) hours as needed for severe pain. 30 tablet 0  . polyethylene glycol (MIRALAX / GLYCOLAX) 17 g packet Take 17 g by mouth daily. 90 each 0  . tamsulosin (FLOMAX) 0.4 MG CAPS capsule Take 1 capsule (0.4 mg total) by mouth daily. 90 capsule 3  . traZODone (DESYREL) 50 MG tablet Take 1 tablet (50 mg total) by mouth at bedtime as needed for sleep. 30 tablet 1   No current facility-administered medications for this visit.    VITAL SIGNS: There were no vitals taken for this visit. There were no vitals filed for this visit.  Estimated body mass index is 31.6 kg/m as calculated from the following:   Height as of an earlier encounter on 07/16/20: 5' 11"  (1.803 m).   Weight as of an earlier encounter on 07/16/20: 226 lb 9.6 oz (102.8 kg).  LABS: CBC:    Component Value Date/Time   WBC 9.8 07/16/2020 1312   HGB 11.7 (L) 07/16/2020 1312   HCT 35.8 (L) 07/16/2020 1312   PLT 141 (L) 07/16/2020 1312   MCV 94.5 07/16/2020 1312   NEUTROABS 6.0 07/16/2020 1312   LYMPHSABS 2.3 07/16/2020 1312   MONOABS 1.2 (H) 07/16/2020 1312   EOSABS 0.1 07/16/2020 1312   BASOSABS 0.1 07/16/2020 1312   Comprehensive Metabolic Panel:    Component Value Date/Time   NA 140 07/16/2020 1312   K 4.2 07/16/2020 1312   CL 104 07/16/2020 1312   CO2 26 07/16/2020 1312   BUN 17 07/16/2020 1312   CREATININE 0.73 07/16/2020 1312   GLUCOSE 160 (H) 07/16/2020 1312   CALCIUM 9.1 07/16/2020 1312   AST 48 (H) 07/09/2020 0821   ALT 18 07/09/2020 0821   ALKPHOS 249 (H) 07/09/2020 0821    BILITOT 0.8 07/09/2020 0821   PROT 7.0 07/09/2020 0821   ALBUMIN 3.4 (L) 07/09/2020 0821    RADIOGRAPHIC STUDIES: CT HEAD WO CONTRAST  Result Date: 07/09/2020 CLINICAL DATA:  Initial evaluation for acute mental status change, unknown cause. EXAM: CT HEAD WITHOUT CONTRAST TECHNIQUE: Contiguous axial images were obtained from the base of the skull through the vertex without intravenous contrast. COMPARISON:  Previous MRI from 05/09/2020. FINDINGS: Brain: Moderately advanced cerebral atrophy. Associated mild chronic microvascular ischemic disease. No acute intracranial hemorrhage. No acute large vessel territory infarct. No intraparenchymal mass lesion, mass effect, or midline shift. No hydrocephalus or extra-axial fluid collection. Vascular: No hyperdense vessel. Scattered vascular calcifications noted within the carotid siphons. Skull: Scalp soft tissues within normal limits. Calvarium intact. Small probable metastatic lesion involving the clivus is grossly similar to previous MRI. No other visible discrete osseous lesions. Sinuses/Orbits: Globes and orbital soft tissues demonstrate no acute finding. Paranasal sinuses are largely clear. No mastoid effusion. Other: None. IMPRESSION: 1. No acute intracranial abnormality. 2. Moderately advanced cerebral atrophy with mild chronic small vessel ischemic disease. 3. Small probable metastatic lesion involving the clivus, grossly similar to previous MRI. Electronically Signed   By: Jeannine Boga M.D.   On: 07/09/2020 22:33   CT Chest W Contrast  Result Date: 07/06/2020 CLINICAL DATA:  Follow-up metastatic right lung non-small cell carcinoma. Currently undergoing chemotherapy. EXAM: CT CHEST WITH CONTRAST TECHNIQUE: Multidetector CT imaging of the chest was performed during intravenous contrast administration. CONTRAST:  45m OMNIPAQUE IOHEXOL 300 MG/ML  SOLN COMPARISON:  PET-CT on 05/15/2019 FINDINGS: Cardiovascular: No acute findings. Aortic and coronary  atherosclerotic calcification noted. Mediastinum/Nodes: Mediastinal lymphadenopathy in right paratracheal region remains stable measuring 2.2 cm short axis. Right hilar lymphadenopathy also shows no significant change measuring 2.5 cm. No new or increased sites of lymphadenopathy identified. Lungs/Pleura: Irregular masslike opacity in the central right upper lobe shows no significant change, measuring 4.4 x 2.7 cm on image 59/2. An irregular pulmonary nodule in the anterior right upper lobe shows mild decrease in size, currently measuring 2.0 x 1.8 cm on image 43/3 compared to 2.3 x 2.2 cm previously. Several sub-cm adjacent sub pleural nodules in the anterior right middle lobe remains stable. Focal opacity in the anterior right upper lobe is unchanged and consistent with postobstructive atelectasis or pneumonitis. Moderate to large right pleural  effusion shows mild increase in size since previous study. Diffuse pleural based soft tissue nodularity shows no significant change and indicates a malignant pleural effusion. Upper Abdomen:  Unremarkable. Musculoskeletal:  No suspicious bone lesions. IMPRESSION: Slight decrease in size of anterior right upper lobe pulmonary nodule. Stable masslike opacity in central right upper lobe, and stable right hilar and right paratracheal lymphadenopathy. Stable opacity in medial right upper lobe, likely due to postobstructive atelectasis or pneumonitis. Mild increase in size of malignant right pleural effusion. Aortic Atherosclerosis (ICD10-I70.0). Electronically Signed   By: Marlaine Hind M.D.   On: 07/06/2020 16:51   MR THORACIC SPINE W WO CONTRAST  Result Date: 07/10/2020 CLINICAL DATA:  Metastatic non-small cell lung cancer. Back pain. History of back surgery as a teenager. EXAM: MRI THORACIC WITHOUT AND WITH CONTRAST TECHNIQUE: Multiplanar and multiecho pulse sequences of the thoracic spine were obtained without and with intravenous contrast. CONTRAST:  39m GADAVIST  GADOBUTROL 1 MMOL/ML IV SOLN COMPARISON:  CT chest 07/05/2020 FINDINGS: Alignment:  Normal Vertebrae: Chronic compression fracture T12 with posterior wire fixation. Widespread metastatic disease throughout the bone marrow of the thoracic spine with all thoracic levels involved. Diffuse rib involvement. No acute fracture. No epidural tumor. Cord:  Negative for cord compression.  Cord signal normal. Paraspinal and other soft tissues: Large right pleural effusion Disc levels: Negative for thoracic spinal stenosis. Small central disc protrusion at T7-8. 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 No pathologic fracture. No cord compression. Large right pleural effusion Chronic fracture T12 with posterior wire fixation. Electronically Signed   By: CFranchot GalloM.D.   On: 07/10/2020 14:41   MR Lumbar Spine W Wo Contrast  Result Date: 07/10/2020 CLINICAL DATA:  Non-small-cell lung cancer. Back pain. History of back surgery as a teenager. EXAM: MRI LUMBAR SPINE WITHOUT AND WITH CONTRAST TECHNIQUE: Multiplanar and multiecho pulse sequences of the lumbar spine were obtained without and with intravenous contrast. CONTRAST:  158mGADAVIST GADOBUTROL 1 MMOL/ML IV SOLN COMPARISON:  Lumbar MRI 11/01/2009 FINDINGS: Segmentation:  Normal Alignment:  Mild retrolisthesis L2-3 Vertebrae: Widespread skeletal metastatic disease. Numerous small lesions are seen throughout the bone marrow of the lumbar spine, sacrum, and iliac bones. No pathologic fracture. Chronic fracture T12 with posterior wire fixation as noted on thoracic MRI. Conus medullaris and cauda equina: Conus extends to the L1-2 level. Conus and cauda equina appear normal. Paraspinal and other soft tissues: Negative for paraspinous mass or adenopathy. Right pleural effusion Disc levels: L1-2: Negative L2-3: Mild retrolisthesis. Diffuse disc bulging and spurring without stenosis. Interval  resolution of right foraminal disc protrusion seen on the prior MRI L3-4: Disc degeneration with diffuse disc bulging and spurring. Bilateral facet degeneration. Mild subarticular stenosis bilaterally L4-5: Disc degeneration with diffuse disc bulging and bilateral facet hypertrophy. Mild to moderate spinal stenosis. Moderate subarticular stenosis and foraminal stenosis on the right. L5-S1: Disc bulging and spurring. Left foraminal disc and osteophyte and left foraminal encroachment unchanged from the prior MRI. 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 No pathologic fracture. Lumbar degenerative changes. Electronically Signed   By: ChFranchot Gallo.D.   On: 07/10/2020 14:46   DG Chest Port 1 View  Result Date: 07/11/2020 CLINICAL DATA:  Pleural effusion, status post right thoracentesis. EXAM: PORTABLE CHEST 1 VIEW COMPARISON:  07/09/2020 FINDINGS: Power injectable right Port-A-Cath tip: Right atrium. Reduced right pleural effusion with resolution of the blunting of the right lateral costophrenic angle.  No visible pneumothorax. Atherosclerotic calcification of the aortic arch. Fatty bandlike opacity at the right lung base favoring mild residual atelectasis. IMPRESSION: 1. Substantially reduced right pleural effusion, status post right thoracentesis. No visible pneumothorax. Mild atelectasis at the right lung base. 2.  Aortic Atherosclerosis (ICD10-I70.0). Electronically Signed   By: Van Clines M.D.   On: 07/11/2020 11:05   DG Chest Portable 1 View  Result Date: 07/09/2020 CLINICAL DATA:  Chemotherapy for lung cancer. EXAM: PORTABLE CHEST 1 VIEW COMPARISON:  05/06/2020 FINDINGS: Power port on the right has its tip at the SVC RA junction. The left chest is clear. There is a sub pulmonic effusion on the right and a small amount of fluid in the fissure. Volume loss in the right lower lung. 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 IMPRESSION:  1. Subpulmonic effusion on the right with some atelectasis of the right lower lung. 2. 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 Electronically Signed   By: Nelson Chimes M.D.   On: 07/09/2020 11:31   DG HIP UNILAT WITH PELVIS 2-3 VIEWS RIGHT  Result Date: 07/03/2020 CLINICAL DATA:  Right hip and upper leg pain since 06/28/2020. History of lung cancer. No known injury. EXAM: DG HIP (WITH OR WITHOUT PELVIS) 2-3V RIGHT COMPARISON:  None. FINDINGS: There is no evidence of hip fracture or dislocation. Mild degenerative change about the hips appear symmetric from right to left. Lower lumbar spondylosis is incompletely evaluated. Soft tissues are negative. IMPRESSION: No acute abnormality. Mild appearing bilateral hip osteoarthritis. Lower lumbar degenerative change also noted. Electronically Signed   By: Inge Rise M.D.   On: 07/03/2020 16:41   DG FEMUR, MIN 2 VIEWS RIGHT  Result Date: 07/03/2020 CLINICAL DATA:  Right hip and upper leg pain since 06/28/2020. History of lung cancer. No known injury. EXAM: RIGHT FEMUR 2 VIEWS COMPARISON:  None. FINDINGS: There is no evidence of fracture or other focal bone lesions. Soft tissues are unremarkable. IMPRESSION: Negative exam. Electronically Signed   By: Inge Rise M.D.   On: 07/03/2020 16:43   US THORACENTESIS ASP PLEURAL SPACE W/IMG GUIDE  Result Date: 07/11/2020 INDICATION: Patient with a history of non-small cell lung cancer presents today with a right pleural effusion. Interventional radiology asked to perform a therapeutic and diagnostic thoracentesis. EXAM: ULTRASOUND GUIDED THORACENTESIS MEDICATIONS: 1% lidocaine 10 mL COMPLICATIONS: None immediate. PROCEDURE: An ultrasound guided thoracentesis was thoroughly discussed with the patient and questions answered. The benefits, risks, alternatives and complications were also discussed. The patient understands and wishes to proceed with the procedure. Written consent was obtained. Ultrasound  was performed to localize and mark an adequate pocket of fluid in the right chest. The area was then prepped and draped in the normal sterile fashion. 1% Lidocaine was used for local anesthesia. Under ultrasound guidance a 6 Fr Safe-T-Centesis catheter was introduced. Thoracentesis was performed. The catheter was removed and a dressing applied. FINDINGS: A total of approximately 2.5 L of amber-colored/blood-tinged fluid fluid was removed. Samples were sent to the laboratory as requested by the clinical team. IMPRESSION: Successful ultrasound guided right thoracentesis yielding 2.5 L of pleural fluid. Read by: Soyla Dryer, NP Electronically Signed   By: Sandi Mariscal M.D.   On: 07/11/2020 11:08    PERFORMANCE STATUS (ECOG) : 1 - Symptomatic but completely ambulatory  Review of Systems Unless otherwise noted, a complete review of systems is negative.  Physical Exam General: NAD Pulmonary: Unlabored Extremities: no edema, no joint deformities Skin: no rashes Neurological: Weakness but otherwise nonfocal  IMPRESSION: Patient was an add-on to my clinic schedule today at his request to discuss ACP.  Patient requested a DNR order signed.  He states clearly and repeatedly that he would not want to be resuscitated or have his life prolonged artificially on machines.  His wife verbalized agreement with this decision.  I signed a DNR order for him to take home.  Patient says that he is excepting that he is nearing end-of-life and is ready when it is his time.  He is unsure if he wants further treatment for his cancer.  He is awaiting a discussion with Dr. Rogue Bussing.  We did briefly discussed the option of hospice in the future should he decide to forego treatment.  Symptomatically, he reports doing reasonably well.  He says pain is actually improved since he discharged home.  PLAN: -Continue current scope of treatment -DNR/DNI -Follow-up MyChart in 1 month   Patient expressed understanding and  was in agreement with this plan. He also understands that He can call the clinic at any time with any questions, concerns, or complaints.     Time Total: 20 minutes  Visit consisted of counseling and education dealing with the complex and emotionally intense issues of symptom management and palliative care in the setting of serious and potentially life-threatening illness.Greater than 50%  of this time was spent counseling and coordinating care related to the above assessment and plan.  Signed by: Altha Harm, PhD, NP-C

## 2020-07-17 ENCOUNTER — Other Ambulatory Visit: Payer: Self-pay

## 2020-07-17 ENCOUNTER — Telehealth: Payer: Self-pay

## 2020-07-17 ENCOUNTER — Other Ambulatory Visit
Admission: RE | Admit: 2020-07-17 | Discharge: 2020-07-17 | Disposition: A | Discharge status: Home / Self Care | Payer: Medicare Other | Source: Ambulatory Visit | Attending: Internal Medicine | Admitting: Internal Medicine

## 2020-07-17 DIAGNOSIS — Z01812 Encounter for preprocedural laboratory examination: Secondary | ICD-10-CM | POA: Diagnosis present

## 2020-07-17 DIAGNOSIS — Z20822 Contact with and (suspected) exposure to covid-19: Secondary | ICD-10-CM | POA: Diagnosis not present

## 2020-07-17 LAB — SARS CORONAVIRUS 2 (TAT 6-24 HRS): SARS Coronavirus 2: NEGATIVE

## 2020-07-17 NOTE — Telephone Encounter (Signed)
Spoke with pt and his wife to let them know that thoracentesis procedure is scheduled tomorrow at 2:00 arrive at 1:30. Patient [facility] before lunch time. No additional questions or concerns.

## 2020-07-18 ENCOUNTER — Ambulatory Visit
Admission: RE | Admit: 2020-07-18 | Discharge: 2020-07-18 | Disposition: A | Discharge status: Home / Self Care | Payer: Medicare Other | Source: Ambulatory Visit | Attending: Radiology | Admitting: Radiology

## 2020-07-18 ENCOUNTER — Inpatient Hospital Stay: Payer: Medicare Other | Admitting: Hospice and Palliative Medicine

## 2020-07-18 ENCOUNTER — Ambulatory Visit
Admission: RE | Admit: 2020-07-18 | Discharge: 2020-07-18 | Disposition: A | Discharge status: Home / Self Care | Payer: Medicare Other | Source: Ambulatory Visit | Attending: Internal Medicine | Admitting: Internal Medicine

## 2020-07-18 ENCOUNTER — Other Ambulatory Visit: Payer: Self-pay | Admitting: Radiology

## 2020-07-18 ENCOUNTER — Other Ambulatory Visit: Payer: Self-pay

## 2020-07-18 DIAGNOSIS — J90 Pleural effusion, not elsewhere classified: Secondary | ICD-10-CM | POA: Insufficient documentation

## 2020-07-18 DIAGNOSIS — C3490 Malignant neoplasm of unspecified part of unspecified bronchus or lung: Secondary | ICD-10-CM

## 2020-07-18 DIAGNOSIS — C3411 Malignant neoplasm of upper lobe, right bronchus or lung: Secondary | ICD-10-CM

## 2020-07-18 NOTE — Procedures (Signed)
PROCEDURE SUMMARY:  Successful US guided right thoracentesis. Yielded 1.5 L of bloody pleural fluid. Pt tolerated procedure well. No immediate complications.  Specimen was not sent for labs. CXR ordered.  EBL < 5 mL  Ascencion Dike PA-C 07/18/2020 2:30 PM

## 2020-07-19 ENCOUNTER — Telehealth: Payer: Self-pay | Admitting: *Deleted

## 2020-07-19 ENCOUNTER — Inpatient Hospital Stay: Payer: Medicare Other | Admitting: Internal Medicine

## 2020-07-19 NOTE — Telephone Encounter (Signed)
Spoke with patient's wife. Information provided on pleurx cath placement. Patient does not need any covid testing prior to procedure per Olegario Shearer, RN in radiology. Patient wife would like to come next Wednesday afternoon to provide hands on patient education for reinforcement teaching prior to pleurx cath placement. Will send order for supplies to [employer] prior to placement.

## 2020-07-22 NOTE — Telephone Encounter (Signed)
Faxed requisition for pleurx cath supplies to [facility].

## 2020-07-22 NOTE — Progress Notes (Signed)
07/23/2020 12:31 PM   Fred Anderson 06/30/1945 [id number]  Referring provider: Ezequiel Kayser, MD [place] [facility] [place],   [address]  Chief Complaint  Patient presents with  . Benign Prostatic Hypertrophy   Urological history: 1. BPH with LU TS -managed with tamsulosin 0.4 mg daily   2. ED -contributing factors of age, BPH, DM, HTN, HLD, sleep apnea, alcohol abuse, pain medication and blood pressure medications  3. Urinary retention -occurred during recent hospitalization -Foley place for > 999cc on bladder scan   HPI: Fred Anderson is a 75 y.o. male who presents today for catheter removal with his wife, Fred Anderson.   He has recently been diagnosed with metastatic lung cancer and has discontinued his chemotherapy and has decided against palliative radiation.  He is now under the care of hospice.   He does not want to pass away with a catheter in place.   PMH: Past Medical History:  Diagnosis Date  . Arthritis   . Blood in semen   . BPH (benign prostatic hypertrophy)   . Cancer ([place])    melanoma on back  . DDD (degenerative disc disease), lumbar   . Diabetes mellitus without complication ([place])    TYPE 2  . Erectile dysfunction   . Hematospermia   . Hemorrhoids   . History of adenomatous polyp of colon   . History of kidney stones   . Hyperlipidemia   . Hypertension   . Kidney stones   . Obesity   . 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[place])   . Prediabetes     Surgical History: Past Surgical History:  Procedure Laterality Date  . BACK SURGERY  age 82   Broken back  . COLONOSCOPY WITH PROPOFOL N/A 05/05/2017   Procedure: COLONOSCOPY WITH PROPOFOL;  Surgeon: Toledo, Benay Pike, MD;  Location: [facility] ENDOSCOPY;  Service: Gastroenterology;  Laterality: N/A;  . HEMORRHOID SURGERY    . IR IMAGING GUIDED PORT INSERTION  05/17/2020  . LAPAROSCOPIC APPENDECTOMY N/A 04/25/2018   Procedure: APPENDECTOMY LAPAROSCOPIC;  Surgeon:  Herbert Pun, MD;  Location: [facility] ORS;  Service: General;  Laterality: N/A;  . SHOULDER ARTHROSCOPY WITH OPEN ROTATOR CUFF REPAIR Right 04/03/2019   Procedure: SHOULDER ARTHROSCOPY WITH SUBSACAPULARIS REPAIR, OPEN ROTATOR CUFF REPAIR, SUBARCOMIAL DECOMPRESSION, BICEPS TENODESIS;  Surgeon: Leim Fabry, MD;  Location: [facility] ORS;  Service: Orthopedics;  Laterality: Right;  . SMALL INTESTINE SURGERY      Home Medications:  Allergies as of 07/23/2020      Reactions   Atorvastatin Other (See Comments)   Muscle pain.      Medication List       Accurate as of Jul 23, 2020 12:31 PM. If you have any questions, ask your nurse or doctor.        acetaminophen 500 MG tablet Commonly known as: TYLENOL Take 1,000 mg by mouth every 6 (six) hours as needed.   albuterol 108 (90 Base) MCG/ACT inhaler Commonly known as: VENTOLIN HFA Inhale 2 puffs into the lungs every 6 (six) hours as needed for wheezing or shortness of breath.   aspirin 325 MG tablet Take 325 mg by mouth daily.   budesonide-formoterol 160-4.5 MCG/ACT inhaler Commonly known as: Symbicort Inhale 2 puffs into the lungs in the morning and at bedtime. What changed:   when to take this  reasons to take this   chlorpheniramine-HYDROcodone 10-8 MG/5ML Suer Commonly known as: TUSSIONEX Take 5 mLs by mouth at bedtime as needed for cough.  dexamethasone 4 MG tablet Commonly known as: DECADRON Take one pill AM & PM x 2 days; one day before and one day after chemo. None on the day of chemo.   Fish Oil 500 MG Caps Take 500 mg by mouth 2 (two) times daily.   folic acid 1 MG tablet Commonly known as: FOLVITE Take 1 tablet (1 mg total) by mouth daily.   gabapentin 300 MG capsule Commonly known as: NEURONTIN Take 1 capsule (300 mg total) by mouth 2 (two) times daily.   lidocaine-prilocaine cream Commonly known as: EMLA Apply 1 application topically as needed.   lovastatin 40 MG tablet Commonly known as:  MEVACOR Take 40 mg by mouth at bedtime.   metFORMIN 500 MG tablet Commonly known as: GLUCOPHAGE Take 1,000 mg by mouth 2 (two) times daily.   multivitamin with minerals Tabs tablet Take 1 tablet by mouth daily.   ondansetron 8 MG tablet Commonly known as: ZOFRAN One pill every 8 hours as needed for nausea/vomitting.   oxyCODONE 5 MG immediate release tablet Commonly known as: Oxy IR/ROXICODONE Take 1 tablet (5 mg total) by mouth every 8 (eight) hours as needed for severe pain.   polyethylene glycol 17 g packet Commonly known as: MIRALAX / GLYCOLAX Take 17 g by mouth daily.   tamsulosin 0.4 MG Caps capsule Commonly known as: FLOMAX Take 1 capsule (0.4 mg total) by mouth daily.   traZODone 50 MG tablet Commonly known as: DESYREL Take 1 tablet (50 mg total) by mouth at bedtime as needed for sleep.       Allergies:  Allergies  Allergen Reactions  . Atorvastatin Other (See Comments)    Muscle pain.    Family History: Family History  Problem Relation Age of Onset  . Colon cancer Father   . Cancer Mother   . Liver cancer Sister   . Kidney cancer Brother   . Bone cancer Brother   . Kidney disease Neg Hx   . Prostate cancer Neg Hx   . Bladder Cancer Neg Hx     Social History:  reports that he quit smoking about 31 years ago. He has never used smokeless tobacco. He reports current alcohol use of about 12.0 standard drinks of alcohol per week. He reports that he does not use drugs.  ROS: For pertinent review of systems please refer to history of present illness  Physical Exam: BP 116/71   Pulse 61   Ht 5\' 11"  (1.803 m)   Wt 226 lb (102.5 kg)   BMI 31.52 kg/m   Constitutional:  Well nourished. Alert and oriented, No acute distress. HEENT: [place] AT, mask in place.  Trachea midline Cardiovascular: No clubbing, cyanosis, or edema. Respiratory: Normal respiratory effort, no increased work of breathing. Neurologic: Grossly intact, no focal deficits, moving all 4  extremities. Psychiatric: Normal mood and affect.  Laboratory Data: Recent Results (from the past 2160 hour(s))  Basic metabolic panel     Status: Abnormal   Collection Time: 05/06/20  9:16 AM  Result Value Ref Range   Sodium 135 135 - 145 mmol/L   Potassium 4.1 3.5 - 5.1 mmol/L   Chloride 101 98 - 111 mmol/L   CO2 22 22 - 32 mmol/L   Glucose, Bld 186 (H) 70 - 99 mg/dL    Comment: Glucose reference range applies only to samples taken after fasting for at least 8 hours.   BUN 11 8 - 23 mg/dL   Creatinine, Ser 0.69 0.61 - 1.24 mg/dL  Calcium 9.4 8.9 - 10.3 mg/dL   GFR, Estimated >60 >60 mL/min    Comment: (NOTE) Calculated using the CKD-EPI Creatinine Equation (2021)    Anion gap 12 5 - 15    Comment: Performed at [facility], [address] [place], [place] [address]  CBC     Status: None   Collection Time: 05/06/20  9:16 AM  Result Value Ref Range   WBC 7.3 4.0 - 10.5 K/uL   RBC 4.74 4.22 - 5.81 MIL/uL   Hemoglobin 15.0 13.0 - 17.0 g/dL   HCT 44.9 39.0 - 52.0 %   MCV 94.7 80.0 - 100.0 fL   MCH 31.6 26.0 - 34.0 pg   MCHC 33.4 30.0 - 36.0 g/dL   RDW 12.5 11.5 - 15.5 %   Platelets 211 150 - 400 K/uL   nRBC 0.0 0.0 - 0.2 %    Comment: Performed at [facility], [place], [place] [address]  Troponin I (High Sensitivity)     Status: None   Collection Time: 05/06/20  9:16 AM  Result Value Ref Range   Troponin I (High Sensitivity) 8 <18 ng/L    Comment: (NOTE) Elevated high sensitivity troponin I (hsTnI) values and significant  changes across serial measurements may suggest ACS but many other  chronic and acute conditions are known to elevate hsTnI results.  Refer to the "Links" section for chest pain algorithms and additional  guidance. Performed at [facility], [address] [place], [place] [address]   Brain natriuretic peptide     Status: None   Collection Time: 05/06/20  9:16 AM  Result Value Ref Range   B  Natriuretic Peptide 27.8 0.0 - 100.0 pg/mL    Comment: Performed at [facility], [place], [place] [address]  Troponin I (High Sensitivity)     Status: None   Collection Time: 05/06/20 11:41 AM  Result Value Ref Range   Troponin I (High Sensitivity) 6 <18 ng/L    Comment: (NOTE) Elevated high sensitivity troponin I (hsTnI) values and significant  changes across serial measurements may suggest ACS but many other  chronic and acute conditions are known to elevate hsTnI results.  Refer to the "Links" section for chest pain algorithms and additional  guidance. Performed at [facility], [address] [place], [place] [address]   Resp Panel by RT-PCR (Flu A&B, Covid) Nasopharyngeal Swab     Status: None   Collection Time: 05/06/20 12:51 PM   Specimen: Nasopharyngeal Swab; Nasopharyngeal(NP) swabs in vial transport medium  Result Value Ref Range   SARS Coronavirus 2 by RT PCR NEGATIVE NEGATIVE    Comment: (NOTE) SARS-CoV-2 target nucleic acids are NOT DETECTED.  The SARS-CoV-2 RNA is generally detectable in upper respiratory specimens during the acute phase of infection. The lowest concentration of SARS-CoV-2 viral copies this assay can detect is 138 copies/mL. A negative result does not preclude SARS-Cov-2 infection and should not be used as the sole basis for treatment or other patient management decisions. A negative result may occur with  improper specimen collection/handling, submission of specimen other than nasopharyngeal swab, presence of viral mutation(s) within the areas targeted by this assay, and inadequate number of viral copies(<138 copies/mL). A negative result must be combined with clinical observations, patient history, and epidemiological information. The expected result is Negative.  Fact Sheet for Patients:  EntrepreneurPulse.com.au  Fact Sheet for Healthcare Providers:   IncredibleEmployment.be  This test is no t  yet approved or cleared by the [place] and  has been authorized for detection and/or diagnosis of SARS-CoV-2 by FDA under an Emergency Use Authorization (EUA). This EUA will remain  in effect (meaning this test can be used) for the duration of the COVID-19 declaration under Section 564(b)(1) of the Act, 21 U.S.C.section 360bbb-3(b)(1), unless the authorization is terminated  or revoked sooner.       Influenza A by PCR NEGATIVE NEGATIVE   Influenza B by PCR NEGATIVE NEGATIVE    Comment: (NOTE) The Xpert Xpress SARS-CoV-2/FLU/RSV plus assay is intended as an aid in the diagnosis of influenza from Nasopharyngeal swab specimens and should not be used as a sole basis for treatment. Nasal washings and aspirates are unacceptable for Xpert Xpress SARS-CoV-2/FLU/RSV testing.  Fact Sheet for Patients: EntrepreneurPulse.com.au  Fact Sheet for Healthcare Providers: IncredibleEmployment.be  This test is not yet approved or cleared by the [place] FDA and has been authorized for detection and/or diagnosis of SARS-CoV-2 by FDA under an Emergency Use Authorization (EUA). This EUA will remain in effect (meaning this test can be used) for the duration of the COVID-19 declaration under Section 564(b)(1) of the Act, 21 U.S.C. section 360bbb-3(b)(1), unless the authorization is terminated or revoked.  Performed at [facility], [address] [place], [place] [address]   Lactate dehydrogenase (pleural or peritoneal fluid)     Status: Abnormal   Collection Time: 05/06/20  3:37 PM  Result Value Ref Range   LD, Fluid 234 (H) 3 - 23 U/L    Comment: (NOTE) Results should be evaluated in conjunction with serum values    Fluid Type-FLDH CYTO PLEU     Comment: Performed at [facility], [address] [place], [place] [address]  Body fluid cell count with  differential     Status: Abnormal   Collection Time: 05/06/20  3:37 PM  Result Value Ref Range   Fluid Type-FCT CYTO PLEU    Color, Fluid RED (A) YELLOW   Appearance, Fluid TURBID (A) CLEAR   Total Nucleated Cell Count, Fluid 3,497 cu mm   Neutrophil Count, Fluid 4 %   Lymphs, Fluid 81 %   Monocyte-Macrophage-Serous Fluid 15 %   Eos, Fluid 0 %    Comment: Performed at [facility], [address] [place], [place] [address]  Albumin, pleural or peritoneal fluid     Status: None   Collection Time: 05/06/20  3:37 PM  Result Value Ref Range   Albumin, Fluid 3.0 g/dL    Comment: (NOTE) No normal range established for this test Results should be evaluated in conjunction with serum values    Fluid Type-FALB CYTO PLEU     Comment: Performed at [facility], [address] [place], [place] [address]  Protein, pleural or peritoneal fluid     Status: None   Collection Time: 05/06/20  3:37 PM  Result Value Ref Range   Total protein, fluid 4.7 g/dL    Comment: (NOTE) No normal range established for this test Results should be evaluated in conjunction with serum values    Fluid Type-FTP CYTO PLEU     Comment: Performed at [facility], [address] [place],  [address]  Glucose, pleural or peritoneal fluid     Status: None   Collection Time: 05/06/20  3:37 PM  Result Value Ref Range   Glucose, Fluid 124 mg/dL    Comment: (NOTE) No normal range established for this test Results should be  evaluated in conjunction with serum values    Fluid Type-FGLU CYTO PLEU     Comment: Performed at [facility], [address] [place], [place] [address]  Body fluid culture w Gram Stain     Status: None   Collection Time: 05/06/20  3:37 PM   Specimen: PATH Cytology Pleural fluid  Result Value Ref Range   Specimen Description      PLEURAL Performed at [facility], [address], [place], [place] [address]    Special Requests       NONE Performed at [facility], [place], [place] [address]    Gram Stain      FEW WBC PRESENT, PREDOMINANTLY MONONUCLEAR NO ORGANISMS SEEN    Culture      NO GROWTH Performed at [facility], [address], [place], [place] [address]    Report Status 05/10/2020 FINAL   Pathologist smear review     Status: None   Collection Time: 05/06/20  3:37 PM  Result Value Ref Range   Path Review Cytospin preparation of pleural fluid is reviewed.     Comment: Malignant cells present.  Intradepartmental second review was performed, and findings were discussed with Dr. Rogue Bussing. Recommend formal cytopathology review with cell block for further workup. Reviewed by Kathi Simpers, M.D. Performed at [facility], [address] [place], [place] [address]   Cytology - Non PAP;     Status: None   Collection Time: 05/07/20 12:00 AM  Result Value Ref Range   CYTOLOGY - NON GYN      Cytology - Non PAP CASE: [id number] PATIENT: Rathana Delker Non-Gynecological Cytology Report     Specimen Submitted: A. Pleural fluid  Clinical History: None provided    DIAGNOSIS: A. PLEURAL FLUID; ULTRASOUND-GUIDED THORACENTESIS: - MALIGNANT CELLS PRESENT. - METASTATIC NON SMALL CELL CARCINOMA, FAVOR ADENOCARCINOMA.  Comment: Immunohistochemical studies show tumor cells to be positive for TTF-1 and MOC-31, and negative for p40. These findings support the above diagnosis.  There is insufficient tissue present for ancillary molecular testing.  Slides reviewed: 1 ThinPrep, 1 cell block, 1 cytospin  IHC slides were prepared by [facility] for [place] and Pathology, RTP, [place]. All controls stained appropriately.  This test was developed and its performance characteristics determined by [facility]. It has not been cleared or approved by the [place] Food and Drug Administration. The FDA does not require this test to go through premarket FDA review.   This test is used for clinical purposes. It should not be regarded as investigational or for research. This laboratory is certified under the Clinical Laboratory Improvement Amendments (CLIA) as qualified to perform high complexity clinical laboratory testing.  GROSS DESCRIPTION: A. Labeled: Received labeled with the patient's name and date of birth (per requisition pleural fluid) Received: Fresh Collection time: Not provided Placed into formalin time: Not applicable Volume: Less than 5 mL Description of fluid and container in which it is received: Received in a clear plastic tube with a purple lid is red cloudy fluid. Cytospin slide(s) received: Yes, 1  Specimen material submitted for: Cell block and ThinPrep  The cell block material is fixed in formalin for 6 hours prior to processing.   Final Diagnosis performed by Allena Napoleon, MD.   Electronically signed 05/09/2020 3:38:22PM The electronic signature indicates that the named Attending Pathologist has evaluate d the specimen Technical component performed at [facility], [address], [place], [place] [address] Lab: [phone] Dir: Rush Farmer, MD,  MMM  Professional component performed at [facility], [address], [place], [place], [place] [address] Lab: [phone] Dir: Dellia Nims. Rubinas, MD   CEA     Status: Abnormal   Collection Time: 05/13/20  1:08 PM  Result Value Ref Range   CEA 9.3 (H) 0.0 - 4.7 ng/mL    Comment: (NOTE)                             Nonsmokers          <3.9                             Smokers             <5.6 Roche [employer] Electrochemiluminescence Immunoassay (ECLIA) Values obtained with different assay methods or kits cannot be used interchangeably.  Results cannot be interpreted as absolute evidence of the presence or absence of malignant disease. Performed At: [facility] [place], [place] [id number] Rush Farmer MD PJ:[id number]    Comprehensive metabolic panel     Status: Abnormal   Collection Time: 05/13/20  1:08 PM  Result Value Ref Range   Sodium 136 135 - 145 mmol/L   Potassium 4.1 3.5 - 5.1 mmol/L   Chloride 100 98 - 111 mmol/L   CO2 26 22 - 32 mmol/L   Glucose, Bld 147 (H) 70 - 99 mg/dL    Comment: Glucose reference range applies only to samples taken after fasting for at least 8 hours.   BUN 12 8 - 23 mg/dL   Creatinine, Ser 0.69 0.61 - 1.24 mg/dL   Calcium 9.2 8.9 - 10.3 mg/dL   Total Protein 6.8 6.5 - 8.1 g/dL   Albumin 3.4 (L) 3.5 - 5.0 g/dL   AST 24 15 - 41 U/L   ALT 14 0 - 44 U/L   Alkaline Phosphatase 70 38 - 126 U/L   Total Bilirubin 0.7 0.3 - 1.2 mg/dL   GFR, Estimated >60 >60 mL/min    Comment: (NOTE) Calculated using the CKD-EPI Creatinine Equation (2021)    Anion gap 10 5 - 15    Comment: Performed at [facility], [address] [place],  [address]  CBC with Differential/Platelet     Status: None   Collection Time: 05/13/20  1:08 PM  Result Value Ref Range   WBC 7.9 4.0 - 10.5 K/uL   RBC 4.68 4.22 - 5.81 MIL/uL   Hemoglobin 15.0 13.0 - 17.0 g/dL   HCT 44.3 39.0 - 52.0 %   MCV 94.7 80.0 - 100.0 fL   MCH 32.1 26.0 - 34.0 pg   MCHC 33.9 30.0 - 36.0 g/dL   RDW 12.4 11.5 - 15.5 %   Platelets 225 150 - 400 K/uL   nRBC 0.0 0.0 - 0.2 %   Neutrophils Relative % 59 %   Neutro Abs 4.8 1.7 - 7.7 K/uL   Lymphocytes Relative 27 %   Lymphs Abs 2.1 0.7 - 4.0 K/uL   Monocytes Relative 11 %   Monocytes Absolute 0.8 0.1 - 1.0 K/uL   Eosinophils Relative 1 %   Eosinophils Absolute 0.1 0.0 - 0.5 K/uL   Basophils Relative 1 %   Basophils Absolute 0.1 0.0 - 0.1 K/uL   Immature Granulocytes 1 %   Abs Immature Granulocytes 0.06 0.00 - 0.07 K/uL    Comment: Performed at [facility], [address] [place], [place] [address]  Urinalysis, Complete w Microscopic     Status: Abnormal   Collection Time: 05/13/20  2:50 PM  Result Value Ref Range   Color, Urine YELLOW (A) YELLOW    APPearance CLEAR (A) CLEAR   Specific Gravity, Urine 1.027 1.005 - 1.030   pH 5.0 5.0 - 8.0   Glucose, UA NEGATIVE NEGATIVE mg/dL   Hgb urine dipstick NEGATIVE NEGATIVE   Bilirubin Urine NEGATIVE NEGATIVE   Ketones, ur NEGATIVE NEGATIVE mg/dL   Protein, ur NEGATIVE NEGATIVE mg/dL   Nitrite NEGATIVE NEGATIVE   Leukocytes,Ua NEGATIVE NEGATIVE   RBC / HPF 0-5 0 - 5 RBC/hpf   WBC, UA 0-5 0 - 5 WBC/hpf   Bacteria, UA NONE SEEN NONE SEEN   Squamous Epithelial / LPF NONE SEEN 0 - 5   Mucus PRESENT    Hyaline Casts, UA PRESENT     Comment: Performed at [facility], [address] [place], [place] [address]  Glucose, capillary     Status: Abnormal   Collection Time: 05/14/20  2:10 PM  Result Value Ref Range   Glucose-Capillary 106 (H) 70 - 99 mg/dL    Comment: Glucose reference range applies only to samples taken after fasting for at least 8 hours.  Comprehensive metabolic panel     Status: Abnormal   Collection Time: 05/23/20  9:25 AM  Result Value Ref Range   Sodium 134 (L) 135 - 145 mmol/L   Potassium 3.9 3.5 - 5.1 mmol/L   Chloride 97 (L) 98 - 111 mmol/L   CO2 28 22 - 32 mmol/L   Glucose, Bld 145 (H) 70 - 99 mg/dL    Comment: Glucose reference range applies only to samples taken after fasting for at least 8 hours.   BUN 8 8 - 23 mg/dL   Creatinine, Ser 0.66 0.61 - 1.24 mg/dL   Calcium 9.6 8.9 - 10.3 mg/dL   Total Protein 7.0 6.5 - 8.1 g/dL   Albumin 3.4 (L) 3.5 - 5.0 g/dL   AST 28 15 - 41 U/L   ALT 13 0 - 44 U/L   Alkaline Phosphatase 76 38 - 126 U/L   Total Bilirubin 0.7 0.3 - 1.2 mg/dL   GFR, Estimated >60 >60 mL/min    Comment: (NOTE) Calculated using the CKD-EPI Creatinine Equation (2021)    Anion gap 9 5 - 15    Comment: Performed at [facility], [address] [place],  [address]  CBC with Differential     Status: Abnormal   Collection Time: 05/23/20  9:25 AM  Result Value Ref Range   WBC 8.5 4.0 - 10.5 K/uL   RBC 4.69 4.22 - 5.81  MIL/uL   Hemoglobin 14.9 13.0 - 17.0 g/dL   HCT 43.6 39.0 - 52.0 %   MCV 93.0 80.0 - 100.0 fL   MCH 31.8 26.0 - 34.0 pg   MCHC 34.2 30.0 - 36.0 g/dL   RDW 12.4 11.5 - 15.5 %   Platelets 219 150 - 400 K/uL   nRBC 0.0 0.0 - 0.2 %   Neutrophils Relative % 61 %   Neutro Abs 5.2 1.7 - 7.7 K/uL   Lymphocytes Relative 23 %   Lymphs Abs 2.0 0.7 - 4.0 K/uL   Monocytes Relative 12 %   Monocytes Absolute 1.0 0.1 - 1.0 K/uL   Eosinophils Relative 2 %   Eosinophils Absolute 0.1 0.0 - 0.5 K/uL   Basophils Relative 1 %  Basophils Absolute 0.1 0.0 - 0.1 K/uL   Immature Granulocytes 1 %   Abs Immature Granulocytes 0.12 (H) 0.00 - 0.07 K/uL    Comment: Performed at [facility], [address] [place], [place] [address]  CBC with Differential     Status: None   Collection Time: 06/03/20 12:41 PM  Result Value Ref Range   WBC 4.0 4.0 - 10.5 K/uL   RBC 4.38 4.22 - 5.81 MIL/uL   Hemoglobin 13.6 13.0 - 17.0 g/dL   HCT 41.4 39.0 - 52.0 %   MCV 94.5 80.0 - 100.0 fL   MCH 31.1 26.0 - 34.0 pg   MCHC 32.9 30.0 - 36.0 g/dL   RDW 12.6 11.5 - 15.5 %   Platelets 180 150 - 400 K/uL   nRBC 0.0 0.0 - 0.2 %   Neutrophils Relative % 42 %   Neutro Abs 1.7 1.7 - 7.7 K/uL   Lymphocytes Relative 33 %   Lymphs Abs 1.3 0.7 - 4.0 K/uL   Monocytes Relative 21 %   Monocytes Absolute 0.9 0.1 - 1.0 K/uL   Eosinophils Relative 3 %   Eosinophils Absolute 0.1 0.0 - 0.5 K/uL   Basophils Relative 1 %   Basophils Absolute 0.0 0.0 - 0.1 K/uL   Immature Granulocytes 0 %   Abs Immature Granulocytes 0.01 0.00 - 0.07 K/uL    Comment: Performed at [facility], [address] [place], [place] [address]  Comprehensive metabolic panel     Status: Abnormal   Collection Time: 06/03/20 12:41 PM  Result Value Ref Range   Sodium 136 135 - 145 mmol/L   Potassium 4.1 3.5 - 5.1 mmol/L   Chloride 101 98 - 111 mmol/L   CO2 25 22 - 32 mmol/L   Glucose, Bld 180 (H) 70 - 99 mg/dL    Comment: Glucose reference range  applies only to samples taken after fasting for at least 8 hours.   BUN 14 8 - 23 mg/dL   Creatinine, Ser 0.75 0.61 - 1.24 mg/dL   Calcium 9.1 8.9 - 10.3 mg/dL   Total Protein 6.6 6.5 - 8.1 g/dL   Albumin 3.3 (L) 3.5 - 5.0 g/dL   AST 24 15 - 41 U/L   ALT 24 0 - 44 U/L   Alkaline Phosphatase 89 38 - 126 U/L   Total Bilirubin 0.6 0.3 - 1.2 mg/dL   GFR, Estimated >60 >60 mL/min    Comment: (NOTE) Calculated using the CKD-EPI Creatinine Equation (2021)    Anion gap 10 5 - 15    Comment: Performed at [facility], [address] [place], [place] [address]  Comprehensive metabolic panel     Status: Abnormal   Collection Time: 06/18/20  8:49 AM  Result Value Ref Range   Sodium 131 (L) 135 - 145 mmol/L   Potassium 4.3 3.5 - 5.1 mmol/L   Chloride 98 98 - 111 mmol/L   CO2 22 22 - 32 mmol/L   Glucose, Bld 188 (H) 70 - 99 mg/dL    Comment: Glucose reference range applies only to samples taken after fasting for at least 8 hours.   BUN 13 8 - 23 mg/dL   Creatinine, Ser 0.50 (L) 0.61 - 1.24 mg/dL   Calcium 9.2 8.9 - 10.3 mg/dL   Total Protein 7.3 6.5 - 8.1 g/dL   Albumin 3.5 3.5 - 5.0 g/dL   AST 28 15 - 41 U/L   ALT 16 0 - 44 U/L  Alkaline Phosphatase 174 (H) 38 - 126 U/L   Total Bilirubin 0.7 0.3 - 1.2 mg/dL   GFR, Estimated >60 >60 mL/min    Comment: (NOTE) Calculated using the CKD-EPI Creatinine Equation (2021)    Anion gap 11 5 - 15    Comment: Performed at [facility], [address] [place], [place] [address]  CBC with Differential     Status: Abnormal   Collection Time: 06/18/20  8:49 AM  Result Value Ref Range   WBC 11.8 (H) 4.0 - 10.5 K/uL   RBC 4.37 4.22 - 5.81 MIL/uL   Hemoglobin 13.5 13.0 - 17.0 g/dL   HCT 39.9 39.0 - 52.0 %   MCV 91.3 80.0 - 100.0 fL   MCH 30.9 26.0 - 34.0 pg   MCHC 33.8 30.0 - 36.0 g/dL   RDW 13.1 11.5 - 15.5 %   Platelets 213 150 - 400 K/uL   nRBC 0.0 0.0 - 0.2 %   Neutrophils Relative % 74 %   Neutro Abs 8.8 (H) 1.7 - 7.7 K/uL    Lymphocytes Relative 16 %   Lymphs Abs 1.8 0.7 - 4.0 K/uL   Monocytes Relative 8 %   Monocytes Absolute 0.9 0.1 - 1.0 K/uL   Eosinophils Relative 0 %   Eosinophils Absolute 0.0 0.0 - 0.5 K/uL   Basophils Relative 0 %   Basophils Absolute 0.0 0.0 - 0.1 K/uL   Immature Granulocytes 2 %   Abs Immature Granulocytes 0.18 (H) 0.00 - 0.07 K/uL    Comment: Performed at [facility], [address] [place], [place] [address]  TSH     Status: None   Collection Time: 06/18/20  8:49 AM  Result Value Ref Range   TSH 3.017 0.350 - 4.500 uIU/mL    Comment: Performed by a 3rd Generation assay with a functional sensitivity of <=0.01 uIU/mL. Performed at [facility], [address] [place], [place] [address]   CBC with Differential     Status: Abnormal   Collection Time: 06/25/20  2:41 PM  Result Value Ref Range   WBC 4.4 4.0 - 10.5 K/uL   RBC 4.11 (L) 4.22 - 5.81 MIL/uL   Hemoglobin 12.7 (L) 13.0 - 17.0 g/dL   HCT 38.4 (L) 39.0 - 52.0 %   MCV 93.4 80.0 - 100.0 fL   MCH 30.9 26.0 - 34.0 pg   MCHC 33.1 30.0 - 36.0 g/dL   RDW 13.4 11.5 - 15.5 %   Platelets 138 (L) 150 - 400 K/uL   nRBC 0.0 0.0 - 0.2 %   Neutrophils Relative % 41 %   Neutro Abs 1.8 1.7 - 7.7 K/uL   Lymphocytes Relative 40 %   Lymphs Abs 1.8 0.7 - 4.0 K/uL   Monocytes Relative 16 %   Monocytes Absolute 0.7 0.1 - 1.0 K/uL   Eosinophils Relative 1 %   Eosinophils Absolute 0.1 0.0 - 0.5 K/uL   Basophils Relative 1 %   Basophils Absolute 0.1 0.0 - 0.1 K/uL   Immature Granulocytes 1 %   Abs Immature Granulocytes 0.02 0.00 - 0.07 K/uL    Comment: Performed at [facility], [address] [place], [place] [address]  Comprehensive metabolic panel     Status: Abnormal   Collection Time: 06/25/20  2:41 PM  Result Value Ref Range   Sodium 136 135 - 145 mmol/L   Potassium 4.5 3.5 - 5.1 mmol/L   Chloride 102 98 - 111 mmol/L   CO2  23 22 - 32 mmol/L   Glucose, Bld 117 (H) 70 - 99 mg/dL    Comment:  Glucose reference range applies only to samples taken after fasting for at least 8 hours.   BUN 23 8 - 23 mg/dL   Creatinine, Ser 0.77 0.61 - 1.24 mg/dL   Calcium 8.8 (L) 8.9 - 10.3 mg/dL   Total Protein 7.0 6.5 - 8.1 g/dL   Albumin 3.3 (L) 3.5 - 5.0 g/dL   AST 45 (H) 15 - 41 U/L   ALT 44 0 - 44 U/L   Alkaline Phosphatase 160 (H) 38 - 126 U/L   Total Bilirubin 0.5 0.3 - 1.2 mg/dL   GFR, Estimated >60 >60 mL/min    Comment: (NOTE) Calculated using the CKD-EPI Creatinine Equation (2021)    Anion gap 11 5 - 15    Comment: Performed at [facility], [address] [place], [place] [address]  CBC with Differential     Status: Abnormal   Collection Time: 07/09/20  8:21 AM  Result Value Ref Range   WBC 7.3 4.0 - 10.5 K/uL   RBC 4.06 (L) 4.22 - 5.81 MIL/uL   Hemoglobin 12.5 (L) 13.0 - 17.0 g/dL   HCT 35.8 (L) 39.0 - 52.0 %   MCV 88.2 80.0 - 100.0 fL   MCH 30.8 26.0 - 34.0 pg   MCHC 34.9 30.0 - 36.0 g/dL   RDW 13.7 11.5 - 15.5 %   Platelets 229 150 - 400 K/uL   nRBC 0.0 0.0 - 0.2 %   Neutrophils Relative % 70 %   Neutro Abs 5.1 1.7 - 7.7 K/uL   Lymphocytes Relative 15 %   Lymphs Abs 1.1 0.7 - 4.0 K/uL   Monocytes Relative 13 %   Monocytes Absolute 0.9 0.1 - 1.0 K/uL   Eosinophils Relative 0 %   Eosinophils Absolute 0.0 0.0 - 0.5 K/uL   Basophils Relative 0 %   Basophils Absolute 0.0 0.0 - 0.1 K/uL   Immature Granulocytes 2 %   Abs Immature Granulocytes 0.11 (H) 0.00 - 0.07 K/uL    Comment: Performed at [facility], [address] [place],  [address]  Comprehensive metabolic panel     Status: Abnormal   Collection Time: 07/09/20  8:21 AM  Result Value Ref Range   Sodium 119 (LL) 135 - 145 mmol/L    Comment: CRITICAL RESULT CALLED TO, READ BACK BY AND VERIFIED WITH: DR.BRAHMANDAY AT 0900 ON 4.26.22 BY ISLEY,B    Potassium 4.6 3.5 - 5.1 mmol/L   Chloride 84 (L) 98 - 111 mmol/L   CO2 23 22 - 32 mmol/L   Glucose, Bld 174 (H) 70 - 99 mg/dL    Comment:  Glucose reference range applies only to samples taken after fasting for at least 8 hours.   BUN 18 8 - 23 mg/dL   Creatinine, Ser 0.62 0.61 - 1.24 mg/dL   Calcium 9.0 8.9 - 10.3 mg/dL   Total Protein 7.0 6.5 - 8.1 g/dL   Albumin 3.4 (L) 3.5 - 5.0 g/dL   AST 48 (H) 15 - 41 U/L   ALT 18 0 - 44 U/L   Alkaline Phosphatase 249 (H) 38 - 126 U/L   Total Bilirubin 0.8 0.3 - 1.2 mg/dL   GFR, Estimated >60 >60 mL/min    Comment: (NOTE) Calculated using the CKD-EPI Creatinine Equation (2021)    Anion gap 12 5 - 15    Comment: Performed at [facility], [address] [place], [place] [address]  Resp Panel by RT-PCR (Flu A&B, Covid) Nasopharyngeal Swab     Status: None   Collection Time: 07/09/20 11:03 AM   Specimen: Nasopharyngeal Swab; Nasopharyngeal(NP) swabs in vial transport medium  Result Value Ref Range   SARS Coronavirus 2 by RT PCR NEGATIVE NEGATIVE    Comment: (NOTE) SARS-CoV-2 target nucleic acids are NOT DETECTED.  The SARS-CoV-2 RNA is generally detectable in upper respiratory specimens during the acute phase of infection. The lowest concentration of SARS-CoV-2 viral copies this assay can detect is 138 copies/mL. A negative result does not preclude SARS-Cov-2 infection and should not be used as the sole basis for treatment or other patient management decisions. A negative result may occur with  improper specimen collection/handling, submission of specimen other than nasopharyngeal swab, presence of viral mutation(s) within the areas targeted by this assay, and inadequate number of viral copies(<138 copies/mL). A negative result must be combined with clinical observations, patient history, and epidemiological information. The expected result is Negative.  Fact Sheet for Patients:  EntrepreneurPulse.com.au  Fact Sheet for Healthcare Providers:  IncredibleEmployment.be  This test is no t yet approved or cleared by the [place]  FDA and  has been authorized for detection and/or diagnosis of SARS-CoV-2 by FDA under an Emergency Use Authorization (EUA). This EUA will remain  in effect (meaning this test can be used) for the duration of the COVID-19 declaration under Section 564(b)(1) of the Act, 21 U.S.C.section 360bbb-3(b)(1), unless the authorization is terminated  or revoked sooner.       Influenza A by PCR NEGATIVE NEGATIVE   Influenza B by PCR NEGATIVE NEGATIVE    Comment: (NOTE) The Xpert Xpress SARS-CoV-2/FLU/RSV plus assay is intended as an aid in the diagnosis of influenza from Nasopharyngeal swab specimens and should not be used as a sole basis for treatment. Nasal washings and aspirates are unacceptable for Xpert Xpress SARS-CoV-2/FLU/RSV testing.  Fact Sheet for Patients: EntrepreneurPulse.com.au  Fact Sheet for Healthcare Providers: IncredibleEmployment.be  This test is not yet approved or cleared by the [place] FDA and has been authorized for detection and/or diagnosis of SARS-CoV-2 by FDA under an Emergency Use Authorization (EUA). This EUA will remain in effect (meaning this test can be used) for the duration of the COVID-19 declaration under Section 564(b)(1) of the Act, 21 U.S.C. section 360bbb-3(b)(1), unless the authorization is terminated or revoked.  Performed at [facility], [address] [place], [place] [address]   Osmolality     Status: Abnormal   Collection Time: 07/09/20 11:50 AM  Result Value Ref Range   Osmolality 253 (L) 275 - 295 mOsm/kg    Comment: Performed at [facility], [address] [place], [place] [address]  Basic metabolic panel     Status: Abnormal   Collection Time: 07/09/20 11:50 AM  Result Value Ref Range   Sodium 115 (LL) 135 - 145 mmol/L    Comment: CRITICAL RESULT CALLED TO, READ BACK BY AND VERIFIED WITH JENNIFER GREGORY AT 1304 ON 07/09/2020 [place].    Potassium 4.9 3.5 - 5.1 mmol/L    Chloride 82 (L) 98 - 111 mmol/L   CO2 23 22 - 32 mmol/L   Glucose, Bld 154 (H) 70 - 99 mg/dL    Comment: Glucose reference range applies only to samples taken after fasting for at least 8 hours.   BUN 16 8 - 23 mg/dL   Creatinine, Ser 0.55 (L) 0.61 - 1.24 mg/dL   Calcium  8.9 8.9 - 10.3 mg/dL   GFR, Estimated >60 >60 mL/min    Comment: (NOTE) Calculated using the CKD-EPI Creatinine Equation (2021)    Anion gap 10 5 - 15    Comment: Performed at [facility], [address] [place], [place] [address]  Sodium     Status: Abnormal   Collection Time: 07/09/20  3:15 PM  Result Value Ref Range   Sodium 115 (LL) 135 - 145 mmol/L    Comment: CRITICAL RESULT CALLED TO, READ BACK BY AND VERIFIED WITH AMY COYNE RN @1606  07/09/20 SCS Performed at [facility], [address] [place], [place] [address]   Glucose, capillary     Status: Abnormal   Collection Time: 07/09/20  5:13 PM  Result Value Ref Range   Glucose-Capillary 188 (H) 70 - 99 mg/dL    Comment: Glucose reference range applies only to samples taken after fasting for at least 8 hours.  Sodium     Status: Abnormal   Collection Time: 07/09/20  5:20 PM  Result Value Ref Range   Sodium 117 (LL) 135 - 145 mmol/L    Comment: CRITICAL RESULT CALLED TO, READ BACK BY AND VERIFIED WITH KELLY ISLEY @1752  ON 07/09/20 SKL Performed at [facility], [address] [place], [place] [address]   MRSA PCR Screening     Status: None   Collection Time: 07/09/20  5:25 PM   Specimen: Nasal Mucosa; Nasopharyngeal  Result Value Ref Range   MRSA by PCR NEGATIVE NEGATIVE    Comment:        The GeneXpert MRSA Assay (FDA approved for NASAL specimens only), is one component of a comprehensive MRSA colonization surveillance program. It is not intended to diagnose MRSA infection nor to guide or monitor treatment for MRSA infections. Performed at [facility], [address] [place], [place] [address]    Osmolality, urine     Status: None   Collection Time: 07/09/20  7:00 PM  Result Value Ref Range   Osmolality, Ur 750 300 - 900 mOsm/kg    Comment: Performed at [facility], [address] [place], [place] [address]  Sodium, urine, random     Status: None   Collection Time: 07/09/20  7:00 PM  Result Value Ref Range   Sodium, Ur 71 mmol/L    Comment: Performed at [facility], [address] [place], [place] [address]  Basic metabolic panel     Status: Abnormal   Collection Time: 07/09/20  7:16 PM  Result Value Ref Range   Sodium 118 (LL) 135 - 145 mmol/L    Comment: CRITICAL RESULT CALLED TO, READ BACK BY AND VERIFIED WITH PIERCE DO @1956  ON 07/09/20 SKL    Potassium 4.4 3.5 - 5.1 mmol/L   Chloride 85 (L) 98 - 111 mmol/L   CO2 25 22 - 32 mmol/L   Glucose, Bld 139 (H) 70 - 99 mg/dL    Comment: Glucose reference range applies only to samples taken after fasting for at least 8 hours.   BUN 13 8 - 23 mg/dL   Creatinine, Ser 0.53 (L) 0.61 - 1.24 mg/dL   Calcium 8.7 (L) 8.9 - 10.3 mg/dL   GFR, Estimated >60 >60 mL/min    Comment: (NOTE) Calculated using the CKD-EPI Creatinine Equation (2021)    Anion gap 8 5 - 15    Comment: Performed at [facility], [address] [place], [place] [address]  Sodium     Status:  Abnormal   Collection Time: 07/09/20  9:29 PM  Result Value Ref Range   Sodium 118 (LL) 135 - 145 mmol/L    Comment: CRITICAL RESULT CALLED TO, READ BACK BY AND VERIFIED WITH PIERCE DO @2152  ON 07/09/20 SKL Performed at [facility], [address] [place], [place] [address]   Basic metabolic panel     Status: Abnormal   Collection Time: 07/10/20  1:05 AM  Result Value Ref Range   Sodium 119 (LL) 135 - 145 mmol/L    Comment: CRITICAL RESULT CALLED TO, READ BACK BY AND VERIFIED WITH PIERCE DO @0140  ON 07/10/20 SKL    Potassium 4.2 3.5 - 5.1 mmol/L   Chloride 87 (L) 98 - 111 mmol/L   CO2 24 22 - 32 mmol/L   Glucose, Bld 123  (H) 70 - 99 mg/dL    Comment: Glucose reference range applies only to samples taken after fasting for at least 8 hours.   BUN 12 8 - 23 mg/dL   Creatinine, Ser 0.41 (L) 0.61 - 1.24 mg/dL   Calcium 8.6 (L) 8.9 - 10.3 mg/dL   GFR, Estimated >60 >60 mL/min    Comment: (NOTE) Calculated using the CKD-EPI Creatinine Equation (2021)    Anion gap 8 5 - 15    Comment: Performed at [facility], [address] [place], [place] [address]  CBC     Status: Abnormal   Collection Time: 07/10/20  1:05 AM  Result Value Ref Range   WBC 7.9 4.0 - 10.5 K/uL   RBC 3.66 (L) 4.22 - 5.81 MIL/uL   Hemoglobin 11.3 (L) 13.0 - 17.0 g/dL   HCT 32.1 (L) 39.0 - 52.0 %   MCV 87.7 80.0 - 100.0 fL   MCH 30.9 26.0 - 34.0 pg   MCHC 35.2 30.0 - 36.0 g/dL   RDW 13.8 11.5 - 15.5 %   Platelets 191 150 - 400 K/uL   nRBC 0.0 0.0 - 0.2 %    Comment: Performed at [facility], [address] [place], [place] [address]  Sodium     Status: Abnormal   Collection Time: 07/10/20  3:35 AM  Result Value Ref Range   Sodium 122 (L) 135 - 145 mmol/L    Comment: Performed at [facility], [address], [place], [place] [address]  Basic metabolic panel     Status: Abnormal   Collection Time: 07/10/20  6:44 AM  Result Value Ref Range   Sodium 124 (L) 135 - 145 mmol/L    Comment: RESULTS VERIFIED BY REPEAT TESTING/hkp   Potassium 4.2 3.5 - 5.1 mmol/L   Chloride 93 (L) 98 - 111 mmol/L   CO2 24 22 - 32 mmol/L   Glucose, Bld 111 (H) 70 - 99 mg/dL    Comment: Glucose reference range applies only to samples taken after fasting for at least 8 hours.   BUN 10 8 - 23 mg/dL   Creatinine, Ser 0.43 (L) 0.61 - 1.24 mg/dL   Calcium 8.6 (L) 8.9 - 10.3 mg/dL   GFR, Estimated >60 >60 mL/min    Comment: (NOTE) Calculated using the CKD-EPI Creatinine Equation (2021)    Anion gap 7 5 - 15    Comment: Performed at [facility], [address] [place], [place] [address]  Cortisol, Random     Status:  None   Collection Time: 07/10/20  9:28 AM  Result Value Ref Range   Cortisol, Plasma 18.4 ug/dL    Comment: (  NOTE) AM    6.7 - 22.6 ug/dL PM   <10.0       ug/dL Performed at [facility] [address] [place], [place] [address]   Sodium     Status: Abnormal   Collection Time: 07/10/20  9:28 AM  Result Value Ref Range   Sodium 124 (L) 135 - 145 mmol/L    Comment: Performed at [facility], [address] [place], [place] [address]  Sodium     Status: Abnormal   Collection Time: 07/10/20  2:40 PM  Result Value Ref Range   Sodium 125 (L) 135 - 145 mmol/L    Comment: Performed at [facility], [address] [place], [place] [address]  Sodium     Status: Abnormal   Collection Time: 07/10/20  8:28 PM  Result Value Ref Range   Sodium 127 (L) 135 - 145 mmol/L    Comment: Performed at [facility], [address] [place], [place] [address]  Sodium     Status: Abnormal   Collection Time: 07/11/20 12:19 AM  Result Value Ref Range   Sodium 128 (L) 135 - 145 mmol/L    Comment: Performed at [facility], [address] [place], [place] [address]  Basic metabolic panel     Status: Abnormal   Collection Time: 07/11/20  5:21 AM  Result Value Ref Range   Sodium 129 (L) 135 - 145 mmol/L   Potassium 4.3 3.5 - 5.1 mmol/L   Chloride 97 (L) 98 - 111 mmol/L   CO2 25 22 - 32 mmol/L   Glucose, Bld 138 (H) 70 - 99 mg/dL    Comment: Glucose reference range applies only to samples taken after fasting for at least 8 hours.   BUN 15 8 - 23 mg/dL   Creatinine, Ser 0.63 0.61 - 1.24 mg/dL   Calcium 8.9 8.9 - 10.3 mg/dL   GFR, Estimated >60 >60 mL/min    Comment: (NOTE) Calculated using the CKD-EPI Creatinine Equation (2021)    Anion gap 7 5 - 15    Comment: Performed at [facility], [address] [place], [place] [address]  Sodium     Status: Abnormal   Collection Time: 07/11/20  8:39 AM  Result Value Ref Range   Sodium 129 (L) 135 -  145 mmol/L    Comment: Performed at [facility], [address] [place], [place] [address]  Lactate dehydrogenase (pleural or peritoneal fluid)     Status: Abnormal   Collection Time: 07/11/20 10:00 AM  Result Value Ref Range   LD, Fluid 170 (H) 3 - 23 U/L    Comment: (NOTE) Results should be evaluated in conjunction with serum values    Fluid Type-FLDH CYTO PLEU     Comment: Performed at [facility], [address] [place], [place] [address]  Body fluid cell count with differential     Status: Abnormal   Collection Time: 07/11/20 10:00 AM  Result Value Ref Range   Fluid Type-FCT CYTO PLEU    Color, Fluid RED (A) YELLOW   Appearance, Fluid CLOUDY (A) CLEAR   Total Nucleated Cell Count, Fluid 922 cu mm   Neutrophil Count, Fluid 4 %   Lymphs, Fluid 83 %   Monocyte-Macrophage-Serous Fluid 13 %   Eos, Fluid 0 %    Comment: Performed at [facility], [address] [place], [place] [address]  Protein, pleural or peritoneal fluid     Status:  None   Collection Time: 07/11/20 10:00 AM  Result Value Ref Range   Total protein, fluid 4.5 g/dL    Comment: (NOTE) No normal range established for this test Results should be evaluated in conjunction with serum values    Fluid Type-FTP CYTO PLEU     Comment: Performed at [facility], [address] [place], [place] [address]  Body fluid culture w Gram Stain     Status: None   Collection Time: 07/11/20 10:00 AM   Specimen: PATH Cytology Pleural fluid  Result Value Ref Range   Specimen Description      PLEURAL Performed at [facility], [address], [place], [place] [address]    Special Requests      PLEURAL Performed at [facility], [address] [place], [place] [address]    Gram Stain NO WBC SEEN NO ORGANISMS SEEN     Culture      NO GROWTH 3 DAYS Performed at [facility], [address], [place], [place] [address]    Report Status 07/14/2020 FINAL    Cytology - Non PAP;     Status: None   Collection Time: 07/11/20 10:01 AM  Result Value Ref Range   CYTOLOGY - NON GYN      CYTOLOGY - NON PAP CASE: [id number] PATIENT: Lathan Ramser Non-Gynecological Cytology Report     Specimen Submitted: A. Pleural fluid, right  Clinical History: None provided    DIAGNOSIS: A. PLEURAL FLUID, RIGHT; ULTRASOUND-GUIDED THORACENTESIS: - POSITIVE FOR MALIGNANCY. 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  Comment: This case is reviewed to together with the previous positive pleural fluid cytology ([id number]) and shows similar cytologic features. Immunohistochemical studies highlight tumor cells both singly and in small clusters, which are positive for TTF-1, MOC-31, and Ber-EP4. These findings support the above diagnosis.  Slides reviewed: 1 ThinPrep, 1 cell block, 1 cytospin  IHC slides were prepared by [facility] for [place] and Pathology, RTP, [place]. All controls stained appropriately.  This test was developed and its performance characteristics determined by [facility]. It has not been cleared or approved  by the [place] Food and Drug Administration. The FDA does not require this test to go through premarket FDA review. This test is used for clinical purposes. It should not be regarded as investigational or for research. This laboratory is certified under the Clinical Laboratory Improvement Amendments (CLIA) as qualified to perform high complexity clinical laboratory testing.  GROSS DESCRIPTION: A. Labeled: Right pleural Received: Without fixative Collection time: 10:00 AM on 07/11/2020 Placed into formalin time: Not applicable Volume: 2956 mL Description of fluid and container in which it is received: Fluid is dark red and opaque with free floating possible mucoid material.  The fluid is received in a see through glass 1000 mL evacuated container. Cytospin slide(s) received: Yes, 1  Specimen  material submitted for: Cell block and ThinPrep  The cell block material is fixed in formalin for 6 hours prior to processing.  Final Diagnosis performed by Allena Napoleon, MD.   Elec tronically signed 07/15/2020 1:28:50PM The electronic signature indicates that the named Attending Pathologist has evaluated the specimen Technical component performed at [facility], [address], [place], [place] [address] Lab: [phone] Dir: Rush Farmer, MD, MMM  Professional component performed at [facility], [facility], [place], [place], [place] [address] Lab: [phone] Dir: Dellia Nims. [place], MD   Sodium     Status: Abnormal   Collection Time: 07/11/20  1:23  PM  Result Value Ref Range   Sodium 130 (L) 135 - 145 mmol/L    Comment: Performed at [facility], [address] [place], [place] [address]  Sodium     Status: Abnormal   Collection Time: 07/11/20  8:08 PM  Result Value Ref Range   Sodium 130 (L) 135 - 145 mmol/L    Comment: Performed at [facility], [address] [place], [place] [address]  Sodium     Status: Abnormal   Collection Time: 07/12/20  6:10 AM  Result Value Ref Range   Sodium 130 (L) 135 - 145 mmol/L    Comment: Performed at [facility], [address] [place], [place] [address]  Basic metabolic panel     Status: Abnormal   Collection Time: 07/12/20  6:20 AM  Result Value Ref Range   Sodium 132 (L) 135 - 145 mmol/L   Potassium 4.2 3.5 - 5.1 mmol/L   Chloride 99 98 - 111 mmol/L   CO2 23 22 - 32 mmol/L   Glucose, Bld 124 (H) 70 - 99 mg/dL    Comment: Glucose reference range applies only to samples taken after fasting for at least 8 hours.   BUN 14 8 - 23 mg/dL   Creatinine, Ser 0.63 0.61 - 1.24 mg/dL   Calcium 8.9 8.9 - 10.3 mg/dL   GFR, Estimated >60 >60 mL/min    Comment: (NOTE) Calculated using the CKD-EPI Creatinine Equation (2021)    Anion gap 10 5 - 15    Comment: Performed at [facility], [address] [place], [place] [address]  Sodium     Status: Abnormal   Collection Time: 07/12/20 11:47 AM  Result Value Ref Range   Sodium 130 (L) 135 - 145 mmol/L    Comment: Performed at [facility], [address] [place], [place] [address]  Urinalysis, Complete w Microscopic Urine, Catheterized     Status: Abnormal   Collection Time: 07/12/20 11:47 AM  Result Value Ref Range   Color, Urine YELLOW (A) YELLOW   APPearance CLEAR (A) CLEAR   Specific Gravity, Urine 1.015 1.005 - 1.030   pH 5.0 5.0 - 8.0   Glucose, UA 50 (A) NEGATIVE mg/dL   Hgb urine dipstick NEGATIVE NEGATIVE   Bilirubin Urine NEGATIVE NEGATIVE   Ketones, ur 5 (A) NEGATIVE mg/dL   Protein, ur NEGATIVE NEGATIVE mg/dL   Nitrite NEGATIVE NEGATIVE   Leukocytes,Ua NEGATIVE NEGATIVE   RBC / HPF 11-20 0 - 5 RBC/hpf   WBC, UA 6-10 0 - 5 WBC/hpf   Bacteria, UA NONE SEEN NONE SEEN   Squamous Epithelial / LPF NONE SEEN 0 - 5   Mucus PRESENT     Comment: Performed at [facility], [address], [place], [place] [address]  Urine Culture     Status: None   Collection Time: 07/12/20 11:47 AM   Specimen: Urine, Random  Result Value Ref Range   Specimen Description      URINE, RANDOM Performed at [facility], [address], [place], [place] [address]    Special Requests      NONE Performed at [facility], [address] [place], [place] [address]    Culture      NO GROWTH Performed at [facility], [place] [address] [place], [place] [address]    Report Status 07/14/2020 FINAL   Basic metabolic panel     Status: Abnormal   Collection Time:  07/16/20  1:12 PM  Result Value Ref Range   Sodium 140 135 - 145 mmol/L   Potassium 4.2 3.5 - 5.1 mmol/L   Chloride 104 98 - 111 mmol/L   CO2 26 22 - 32 mmol/L   Glucose, Bld 160 (H) 70 - 99 mg/dL    Comment: Glucose reference range applies only to samples taken after fasting for at least 8 hours.   BUN 17 8 - 23 mg/dL    Creatinine, Ser 0.73 0.61 - 1.24 mg/dL   Calcium 9.1 8.9 - 10.3 mg/dL   GFR, Estimated >60 >60 mL/min    Comment: (NOTE) Calculated using the CKD-EPI Creatinine Equation (2021)    Anion gap 10 5 - 15    Comment: Performed at [facility], [address] [place], [place] [address]  CBC with Differential     Status: Abnormal   Collection Time: 07/16/20  1:12 PM  Result Value Ref Range   WBC 9.8 4.0 - 10.5 K/uL   RBC 3.79 (L) 4.22 - 5.81 MIL/uL   Hemoglobin 11.7 (L) 13.0 - 17.0 g/dL   HCT 35.8 (L) 39.0 - 52.0 %   MCV 94.5 80.0 - 100.0 fL   MCH 30.9 26.0 - 34.0 pg   MCHC 32.7 30.0 - 36.0 g/dL   RDW 15.2 11.5 - 15.5 %   Platelets 141 (L) 150 - 400 K/uL   nRBC 0.0 0.0 - 0.2 %   Neutrophils Relative % 61 %   Neutro Abs 6.0 1.7 - 7.7 K/uL   Lymphocytes Relative 23 %   Lymphs Abs 2.3 0.7 - 4.0 K/uL   Monocytes Relative 12 %   Monocytes Absolute 1.2 (H) 0.1 - 1.0 K/uL   Eosinophils Relative 1 %   Eosinophils Absolute 0.1 0.0 - 0.5 K/uL   Basophils Relative 1 %   Basophils Absolute 0.1 0.0 - 0.1 K/uL   Immature Granulocytes 2 %   Abs Immature Granulocytes 0.17 (H) 0.00 - 0.07 K/uL    Comment: Performed at [facility], [place], [place] [address]  SARS CORONAVIRUS 2 (TAT 6-24 HRS) Nasopharyngeal Nasopharyngeal Swab     Status: None   Collection Time: 07/17/20 11:31 AM   Specimen: Nasopharyngeal Swab  Result Value Ref Range   SARS Coronavirus 2 NEGATIVE NEGATIVE    Comment: (NOTE) SARS-CoV-2 target nucleic acids are NOT DETECTED.  The SARS-CoV-2 RNA is generally detectable in upper and lower respiratory specimens during the acute phase of infection. Negative results do not preclude SARS-CoV-2 infection, do not rule out co-infections with other pathogens, and should not be used as the sole basis for treatment or other patient management decisions. Negative results must be combined with clinical observations, patient history, and epidemiological  information. The expected result is Negative.  Fact Sheet for Patients: SugarRoll.be  Fact Sheet for Healthcare Providers: https://www.woods-mathews.com/  This test is not yet approved or cleared by the [place] FDA and  has been authorized for detection and/or diagnosis of SARS-CoV-2 by FDA under an Emergency Use Authorization (EUA). This EUA will remain  in effect (meaning this test can be used) for the duration of the COVID-19 declaration under Se ction 564(b)(1) of the Act, 21 U.S.C. section 360bbb-3(b)(1), unless the authorization is terminated or revoked sooner.  Performed at [facility], [place] [address] [place], [place] [address]   Comprehensive metabolic panel     Status: Abnormal   Collection Time: 07/23/20 10:08 AM  Result Value Ref Range  Sodium 138 135 - 145 mmol/L   Potassium 4.3 3.5 - 5.1 mmol/L   Chloride 104 98 - 111 mmol/L   CO2 23 22 - 32 mmol/L   Glucose, Bld 122 (H) 70 - 99 mg/dL    Comment: Glucose reference range applies only to samples taken after fasting for at least 8 hours.   BUN 17 8 - 23 mg/dL   Creatinine, Ser 0.86 0.61 - 1.24 mg/dL   Calcium 9.1 8.9 - 10.3 mg/dL   Total Protein 6.9 6.5 - 8.1 g/dL   Albumin 2.8 (L) 3.5 - 5.0 g/dL   AST 35 15 - 41 U/L   ALT 34 0 - 44 U/L   Alkaline Phosphatase 211 (H) 38 - 126 U/L   Total Bilirubin 0.4 0.3 - 1.2 mg/dL   GFR, Estimated >60 >60 mL/min    Comment: (NOTE) Calculated using the CKD-EPI Creatinine Equation (2021)    Anion gap 11 5 - 15    Comment: Performed at [facility], [address] [place], [place] [address]  CBC with Differential     Status: Abnormal   Collection Time: 07/23/20 10:08 AM  Result Value Ref Range   WBC 7.4 4.0 - 10.5 K/uL   RBC 3.85 (L) 4.22 - 5.81 MIL/uL   Hemoglobin 11.6 (L) 13.0 - 17.0 g/dL   HCT 36.5 (L) 39.0 - 52.0 %   MCV 94.8 80.0 - 100.0 fL   MCH 30.1 26.0 - 34.0 pg   MCHC 31.8 30.0 - 36.0 g/dL   RDW  15.6 (H) 11.5 - 15.5 %   Platelets 217 150 - 400 K/uL   nRBC 0.4 (H) 0.0 - 0.2 %   Neutrophils Relative % 65 %   Neutro Abs 4.9 1.7 - 7.7 K/uL   Lymphocytes Relative 21 %   Lymphs Abs 1.5 0.7 - 4.0 K/uL   Monocytes Relative 9 %   Monocytes Absolute 0.7 0.1 - 1.0 K/uL   Eosinophils Relative 2 %   Eosinophils Absolute 0.1 0.0 - 0.5 K/uL   Basophils Relative 1 %   Basophils Absolute 0.1 0.0 - 0.1 K/uL   Immature Granulocytes 2 %   Abs Immature Granulocytes 0.14 (H) 0.00 - 0.07 K/uL    Comment: Performed at [facility], [address] [place], [place] [address]  I have reviewed the labs  Catheter Removal Patient is present today for a catheter removal.  9 ml of water was drained from the balloon. A 16 FR foley cath was removed from the bladder no complications were noted . Patient tolerated well.   Assessment & Plan:    1. Urinary retention -Foley catheter is removed per patient's request -I instructed them to return by 4 PM if he is unable to urinate -I also gave him daily straight cath [place] as his daughter is a Marine scientist and she would be able to straight catheter him if he should go into retention this evening  2. BPH with LU TS -Continue tamsulosin 0.4 mg daily   No follow-ups on file.  Zara Council, PA-C  [facility] [address] [place], [place] [address] [phone]

## 2020-07-23 ENCOUNTER — Other Ambulatory Visit: Payer: Self-pay

## 2020-07-23 ENCOUNTER — Inpatient Hospital Stay: Payer: Medicare Other

## 2020-07-23 ENCOUNTER — Inpatient Hospital Stay (HOSPITAL_BASED_OUTPATIENT_CLINIC_OR_DEPARTMENT_OTHER): Payer: Medicare Other | Admitting: Hospice and Palliative Medicine

## 2020-07-23 ENCOUNTER — Encounter: Payer: Self-pay | Admitting: Urology

## 2020-07-23 ENCOUNTER — Telehealth: Payer: Self-pay | Admitting: Internal Medicine

## 2020-07-23 ENCOUNTER — Ambulatory Visit: Payer: Medicare Other | Admitting: Urology

## 2020-07-23 VITALS — BP 116/71 | HR 61 | Ht 71.0 in | Wt 226.0 lb

## 2020-07-23 DIAGNOSIS — Z515 Encounter for palliative care: Secondary | ICD-10-CM | POA: Diagnosis not present

## 2020-07-23 DIAGNOSIS — N401 Enlarged prostate with lower urinary tract symptoms: Secondary | ICD-10-CM | POA: Diagnosis not present

## 2020-07-23 DIAGNOSIS — R339 Retention of urine, unspecified: Secondary | ICD-10-CM | POA: Diagnosis not present

## 2020-07-23 DIAGNOSIS — C3411 Malignant neoplasm of upper lobe, right bronchus or lung: Secondary | ICD-10-CM

## 2020-07-23 DIAGNOSIS — N138 Other obstructive and reflux uropathy: Secondary | ICD-10-CM | POA: Diagnosis not present

## 2020-07-23 DIAGNOSIS — G893 Neoplasm related pain (acute) (chronic): Secondary | ICD-10-CM

## 2020-07-23 DIAGNOSIS — C3490 Malignant neoplasm of unspecified part of unspecified bronchus or lung: Secondary | ICD-10-CM

## 2020-07-23 LAB — CBC WITH DIFFERENTIAL/PLATELET
Abs Immature Granulocytes: 0.14 10*3/uL — ABNORMAL HIGH (ref 0.00–0.07)
Basophils Absolute: 0.1 10*3/uL (ref 0.0–0.1)
Basophils Relative: 1 %
Eosinophils Absolute: 0.1 10*3/uL (ref 0.0–0.5)
Eosinophils Relative: 2 %
HCT: 36.5 % — ABNORMAL LOW (ref 39.0–52.0)
Hemoglobin: 11.6 g/dL — ABNORMAL LOW (ref 13.0–17.0)
Immature Granulocytes: 2 %
Lymphocytes Relative: 21 %
Lymphs Abs: 1.5 10*3/uL (ref 0.7–4.0)
MCH: 30.1 pg (ref 26.0–34.0)
MCHC: 31.8 g/dL (ref 30.0–36.0)
MCV: 94.8 fL (ref 80.0–100.0)
Monocytes Absolute: 0.7 10*3/uL (ref 0.1–1.0)
Monocytes Relative: 9 %
Neutro Abs: 4.9 10*3/uL (ref 1.7–7.7)
Neutrophils Relative %: 65 %
Platelets: 217 10*3/uL (ref 150–400)
RBC: 3.85 MIL/uL — ABNORMAL LOW (ref 4.22–5.81)
RDW: 15.6 % — ABNORMAL HIGH (ref 11.5–15.5)
WBC: 7.4 10*3/uL (ref 4.0–10.5)
nRBC: 0.4 % — ABNORMAL HIGH (ref 0.0–0.2)

## 2020-07-23 LAB — COMPREHENSIVE METABOLIC PANEL
ALT: 34 U/L (ref 0–44)
AST: 35 U/L (ref 15–41)
Albumin: 2.8 g/dL — ABNORMAL LOW (ref 3.5–5.0)
Alkaline Phosphatase: 211 U/L — ABNORMAL HIGH (ref 38–126)
Anion gap: 11 (ref 5–15)
BUN: 17 mg/dL (ref 8–23)
CO2: 23 mmol/L (ref 22–32)
Calcium: 9.1 mg/dL (ref 8.9–10.3)
Chloride: 104 mmol/L (ref 98–111)
Creatinine, Ser: 0.86 mg/dL (ref 0.61–1.24)
GFR, Estimated: >60 mL/min (ref >60)
Glucose, Bld: 122 mg/dL — ABNORMAL HIGH (ref 70–99)
Potassium: 4.3 mmol/L (ref 3.5–5.1)
Sodium: 138 mmol/L (ref 135–145)
Total Bilirubin: 0.4 mg/dL (ref 0.3–1.2)
Total Protein: 6.9 g/dL (ref 6.5–8.1)

## 2020-07-23 MED ORDER — GABAPENTIN 300 MG PO CAPS: 300.0000 mg | ORAL_CAPSULE | Freq: Three times a day (TID) | ORAL | 0 refills | Status: AC

## 2020-07-23 MED ORDER — OXYCODONE HCL 5 MG PO TABS: 5.0000 mg | ORAL_TABLET | Freq: Four times a day (QID) | ORAL | 0 refills | Status: DC | PRN

## 2020-07-23 NOTE — Telephone Encounter (Signed)
Patient sodium is normal.  Heather-Please inform when patient gets to see you for Pleurx catheter education.  Josh-please check with the patient/family regarding follow-up with me especially as patient plans on hospice.  I will be happy to follow in the next 3 weeks or so-patient/family is interested.  GB

## 2020-07-23 NOTE — Progress Notes (Signed)
Virtual Visit via Telephone Note  I connected with Fred Anderson on 07/23/20 at  3:00 PM EDT by telephone and verified that I am speaking with the correct person using two identifiers.  Location: Patient: Home Provider: Home   I discussed the limitations, risks, security and privacy concerns of performing an evaluation and management service by telephone and the availability of in person appointments. I also discussed with the patient that there may be a patient responsible charge related to this service. The patient expressed understanding and agreed to proceed.   History of Present Illness: Fred Anderson is a 75 y.o. male with multiple medical problems stage IV non-small cell lung cancer diagnosed in February 2022 on systemic treatment with carbo/pemetrexed/pembrolizumab.Patient was hospitalized 07/09/2020-07/13/2018 with progressive weakness and altered mental status. 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 Palliative care was consulted help address goals.   Observations/Objective: Patient saw urology this AM and had his catheter removed. No voiding yet but patient is optimistic and feels like he can go. He was sent home with I&O cath if needed.   Patient continues to endorse R. Leg pain shooting from his hip to feet. No swelling. 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 We discussed option of increasing his gabapentin to 300mg  TID. Also will refill his oxycodone.   Otherwise, patient reports that he is doing better. His appetite has improved.   Plan is for hospice to start on Thursday and then pleurx on Friday.   Assessment and Plan: Lung cancer - best supportive care. Pt is pending hospice referral with scheduled visit later this week  Recurrent R. sided pleural effusion - patient pending pleurx  Neoplasm related pain - refill oxycodone #45. May increase gabapentin to TID  Follow Up Instructions: As needed   I discussed the assessment and  treatment plan with the patient. The patient was provided an opportunity to ask questions and all were answered. The patient agreed with the plan and demonstrated an understanding of the instructions.   The patient was advised to call back or seek an in-person evaluation if the symptoms worsen or if the condition fails to improve as anticipated.  I provided 10 minutes of non-face-to-face time during this encounter.   Irean Hong, NP

## 2020-07-24 ENCOUNTER — Telehealth: Payer: Self-pay | Admitting: Family Medicine

## 2020-07-24 ENCOUNTER — Inpatient Hospital Stay: Payer: Medicare Other | Admitting: *Deleted

## 2020-07-24 ENCOUNTER — Telehealth: Payer: Self-pay | Admitting: Urology

## 2020-07-24 NOTE — Telephone Encounter (Signed)
I gave him samples of the 21 [place] coud male straight catheter from [place], so we can give him a prescription so that he can cath 3 times daily with these catheters.

## 2020-07-24 NOTE — Telephone Encounter (Signed)
Hospice will be coming out to the patient's home tomorrow. Pleurx cath will be placed on Friday.

## 2020-07-24 NOTE — Telephone Encounter (Signed)
I spoke with his two daughters regarding his in and out cathing.  She states that she is cathing him 3 times daily and the largest amounts she gets out is 300 cc.  I advised them as long as he is comfortable they can reduce the times to catheterize him.  I stated I would not let him go more than 12 hours without urinating as long as he was comfortable.  He also takes tamsulosin and they can have him take 2 capsules, but to watch for any orthostatic hypotension.  We have also sent a prescription to [facility] so they can receive the catheters at home.

## 2020-07-24 NOTE — Progress Notes (Signed)
PLEURX CATH TEACHING PERFORMED   With patient's wife and 2 daughters. Teach back process performed by Hands on demonstration and verbal teaching. Family also watched demonstration video. Family aware that supplies have been ordered but they need to call [facility] [place] services to verify the shipment of supplies.  Hospice to round on patient tomorrow and admit pt to services.Marland Kitchen

## 2020-07-24 NOTE — Patient Instructions (Addendum)
CALL [facility] TO HAVE YOUR SUPPLIES DELIVERED 1 [phone] ON THE DATE OF THE PLEURX CATH PLACEMENT  Indwelling Pleural Catheter Home Guide  An indwelling pleural catheter is a thin, flexible tube that is inserted under your skin and into your chest. The catheter drains excess fluid that collects in the area between the chest wall and the lungs (pleural space).After the catheter is inserted, it can be attached to a bottle that collects fluid. The pleural catheter will allow you to drain fluid from your chest at home on a regular basis (sometimes daily). This will eliminate the need for frequent visits to the hospital or clinic to drain the fluid. The catheter may be removed after the excess fluid problem is resolved, usually after 2-3 months. It is important to follow instructions from your health care provider about how to drain and care for your catheter. What are the risks? Generally, this is a safe procedure. However, problems may occur, including:  Infection.  Skin damage around the catheter.  Lung damage.  Failure of the chest tube to work properly.  Spreading of cancer cells along the catheter, if you have cancer. Supplies needed:  Vacuum-sealed drainage bottle with attached drainage line.  Sterile dressing.  Sterile alcohol pads.  Sterile gloves.  Valve cap.  Sterile gauze pads, 4  4 inch (10 cm  10 cm).  Tape.  Adhesive dressing.  Sterile foam catheter pad. How to care for your catheter and insertion site  Wash your hands with soap and warm water before and after touching the catheter or insertion site. If soap and water are not available, use hand sanitizer.  Check your bandage (dressing) daily to make sure it is clean and dry.  Keep the skin around the catheter clean and dry.  Check the catheter regularly for any cracks or kinks in the tubing.  Check your catheter insertion site every day for signs of infection. Check for: ? Skin  breakdown. ? Redness, swelling, or pain. ? Fluid or blood. ? Warmth. ? Pus or a bad smell. How to drain your catheter You may need to drain your catheter every day, or more or less often as told by your health care provider. Follow instructions from your health care provider about how to drain your catheter. You may also refer to instructions that come with the drainage system. To drain the catheter: 1. Wash your hands with soap and warm water. If soap and water are not available, use hand sanitizer. 2. Carefully remove the dressing from around the catheter. 3. Wash your hands again. 4. Put on the gloves provided. 5. Prepare the vacuum-sealed drainage bottle and drainage line. Close the drainage line of the vacuum-sealed drainage bottle by squeezing the pinch clamp or rolling the wheel of the roller clamp toward the bottle. The vacuum in the bottle will be lost if the line is not closed completely. 6. Remove the access tip cover from the drainage line. Do not touch the end. Set it on a sterile surface. 7. Remove the catheter valve cap and throw it away. 8. Use an alcohol pad to clean the end of the catheter. 9. Insert the access tip into the catheter valve. Make sure the valve and access tip are securely connected. Listen for a click to confirm that they are connected. 10. Insert the T plunger to break the vacuum seal on the drainage bottle. 11. Open the clamp on the drainage line. 12. Allow the catheter to drain. Keep the catheter  and the drainage bottle below the level of your chest. There may be a one-way valve on the end of the tubing that will allow liquid and air to flow out of the catheter without letting air inside. 13. Drain the amount of fluid as told by your health care provider. It usually takes 5-15 minutes. Do not drain more than 1000 mL of fluid. You may feel a little discomfort while you are draining. If the pain is severe, stop draining and contact your health care  provider. 14. After you finish draining the catheter, remove the drainage bottle tubing from the catheter. 15. Use a clean alcohol pad to wipe the catheter tip. 16. Place a clean cap on the end of the catheter. 17. Use an alcohol pad to clean the skin around the catheter. 18. Allow the skin to air-dry. 19. Put the catheter pad on your skin. Curl the catheter into loops and place it on the pad. Do not place the catheter on your skin. 20. Replace the dressing over the catheter. 21. Discard the drainage bottle as instructed by your health care provider. Do not reuse the drainage bottle. How to change your dressing Change your dressing at least once a week, or more often if needed to keep the dressing dry. Be sure to change the dressing whenever it becomes moist. Your health care provider will tell you how often to change your dressing. 1. Wash your hands with soap and warm water. If soap and water are not available, use hand sanitizer. 2. Gently remove the old dressing. Avoid using scissors to remove the dressing. Sharp objects may damage the catheter. 3. Wash the skin around the insertion site with mild, fragrance-free soap and warm water. Rinse well, then pat the area dry with a clean cloth. 4. Check the skin around the catheter for signs of infection. Check for: ? Skin breakdown. ? Redness, swelling, or pain. ? Fluid or blood. ? Warmth. ? Pus or a bad smell. 5. If your catheter was stitched (sutured) to your skin, look at the suture to make sure it is still anchored in your skin. 6. Do not apply creams, ointments, or alcohol to the area. Let your skin air-dry completely before you apply a new dressing. 7. Curl the catheter into loops and place it on the sterile catheter pad. Do not place the catheter on your skin. 8. If you do not have a pad, use a clean dressing. Slide the dressing under the disk that holds the drainage catheter in place. 9. Use gauze to cover the catheter and the catheter  pad. The catheter should rest on the pad or dressing, not on your skin. 10. Tape the dressing to your skin. You may be instructed to use an adhesive dressing covering instead of gauze and tape. 11. Wash your hands with soap and warm water. If soap and water are not available, use hand sanitizer. General recommendations  Always wash your hands with soap and warm water before and after caring for your catheter and drainage bottle. Use a mild, fragrance-free soap. If soap and water are not available, use hand sanitizer.  Always make sure there are no leaks in the catheter or drainage bottle.  Each time you drain the catheter, note the color and amount of fluid.  Do not touch the tip of the catheter or the drainage bottle tubing.  Do not reuse drainage bottles.  Do not take baths, swim, or use a hot tub until your health care provider approves.  Ask your health care provider if you may take showers. You may only be allowed to take sponge baths.  Take deep breaths regularly, followed by a cough. Doing this can help to prevent lung infection. Contact a health care provider if:  You have any questions about caring for your catheter or drainage bottle.  You still have pain at the catheter insertion site more than 2 days after your procedure.  You have pain while draining your catheter.  Your catheter becomes bent, twisted, or cracked.  The connection between the catheter and the collection bottle becomes loose.  You have any of these around your catheter insertion site or coming from it: ? Skin breakdown. ? Redness, swelling, or pain. ? Fluid or blood. ? Warmth. ? Pus or a bad smell. Get help right away if:  You have a fever or chills.  You have chest pain.  You have dizziness or shortness of breath.  You have severe redness, swelling, or pain at your catheter insertion site.  The catheter comes out.  The catheter is blocked or clogged. Summary  An indwelling pleural  catheter is a thin, flexible tube that is inserted under your skin and into your chest. The catheter drains excess fluid that collects in the area between the chest wall and the lungs (pleural space).  It is important to follow instructions from your health care provider about how to drain and care for your catheter.  Do not touch the tip of the catheter or the drainage bottle tubing.  Always wash your hands with soap and water before and after caring for your catheter and drainage bottle. If soap and water are not available, use hand sanitizer. This information is not intended to replace advice given to you by your health care provider. Make sure you discuss any questions you have with your health care provider. Document Revised: 01/01/2020 Document Reviewed: 11/02/2019 Elsevier Patient Education  2021 [employer].

## 2020-07-24 NOTE — Telephone Encounter (Signed)
Patient education performed with wife and 2 daughters.

## 2020-07-24 NOTE — Telephone Encounter (Signed)
[facility] form has been filled out and faxed. Patient's daughters came in office and I gave the message. They voiced understanding.

## 2020-07-24 NOTE — Telephone Encounter (Signed)
Patient called and states he had a catheter removed yesterday and he has not been able to urinate on his own. He states he has been cathing himself and would like to have catheters sent to his home. Please advise

## 2020-07-25 ENCOUNTER — Other Ambulatory Visit: Payer: Self-pay | Admitting: Radiology

## 2020-07-25 ENCOUNTER — Other Ambulatory Visit: Payer: Self-pay | Admitting: Student

## 2020-07-25 NOTE — H&P (Signed)
Chief Complaint: Patient was seen in consultation today for tunneled pleural catheter placement  Referring Physician(s): Cammie Sickle  Supervising Physician: Ruthann Cancer  Patient Status: Fred Anderson - Out-pt  History of Present Illness: Fred Anderson is a 75 y.o. male with a medical history significant for DM, HTN, BPH, urinary retention requiring indwelling foley catheter and stage IV non-small cell lung cancer, diagnosed March 2022. Chemotherapy has produced only a modest response and he has been referred for hospice/palliative care. The patient has recurrent right pleural effusions requiring frequent thoracenteses. His oncology team has requested for Interventional Radiology to evaluate him for the placement of a tunneled pleural catheter for symptom management.   Past Medical History:  Diagnosis Date  . Arthritis   . Blood in semen   . BPH (benign prostatic hypertrophy)   . Cancer ([place])    melanoma on back  . DDD (degenerative disc disease), lumbar   . Diabetes mellitus without complication ([place])    TYPE 2  . Erectile dysfunction   . Hematospermia   . Hemorrhoids   . History of adenomatous polyp of colon   . History of kidney stones   . Hyperlipidemia   . Hypertension   . Kidney stones   . Obesity   . 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[place])   . Prediabetes     Past Surgical History:  Procedure Laterality Date  . BACK SURGERY  age 17   Broken back  . COLONOSCOPY WITH PROPOFOL N/A 05/05/2017   Procedure: COLONOSCOPY WITH PROPOFOL;  Surgeon: Toledo, Benay Pike, MD;  Location: [facility] ENDOSCOPY;  Service: Gastroenterology;  Laterality: N/A;  . HEMORRHOID SURGERY    . IR IMAGING GUIDED PORT INSERTION  05/17/2020  . LAPAROSCOPIC APPENDECTOMY N/A 04/25/2018   Procedure: APPENDECTOMY LAPAROSCOPIC;  Surgeon: Herbert Pun, MD;  Location: [facility] ORS;  Service: General;  Laterality: N/A;  . SHOULDER ARTHROSCOPY WITH OPEN ROTATOR CUFF REPAIR Right  04/03/2019   Procedure: SHOULDER ARTHROSCOPY WITH SUBSACAPULARIS REPAIR, OPEN ROTATOR CUFF REPAIR, SUBARCOMIAL DECOMPRESSION, BICEPS TENODESIS;  Surgeon: Leim Fabry, MD;  Location: [facility] ORS;  Service: Orthopedics;  Laterality: Right;  . SMALL INTESTINE SURGERY      Allergies: Atorvastatin  Medications: Prior to Admission medications   Medication Sig Start Date End Date Taking? Authorizing Provider  acetaminophen (TYLENOL) 500 MG tablet Take 1,000 mg by mouth every 6 (six) hours as needed.    [provider]  albuterol (VENTOLIN HFA) 108 (90 Base) MCG/ACT inhaler Inhale 2 puffs into the lungs every 6 (six) hours as needed for wheezing or shortness of breath. 05/23/20   Cammie Sickle, MD  aspirin 325 MG tablet Take 325 mg by mouth daily.    [provider]  budesonide-formoterol (SYMBICORT) 160-4.5 MCG/ACT inhaler Inhale 2 puffs into the lungs in the morning and at bedtime. Patient taking differently: Inhale 2 puffs into the lungs 2 (two) times daily as needed. 06/03/20   Cammie Sickle, MD  chlorpheniramine-HYDROcodone (TUSSIONEX) 10-8 MG/5ML SUER Take 5 mLs by mouth at bedtime as needed for cough. 05/23/20   Cammie Sickle, MD  dexamethasone (DECADRON) 4 MG tablet Take one pill AM & PM x 2 days; one day before and one day after chemo. None on the day of chemo. 05/13/20   Cammie Sickle, MD  folic acid (FOLVITE) 1 MG tablet Take 1 tablet (1 mg total) by mouth daily. 05/13/20   Cammie Sickle, MD  gabapentin (NEURONTIN) 300 MG capsule Take 1 capsule (300  mg total) by mouth 3 (three) times daily. 07/23/20   Borders, Kirt Boys, NP  lidocaine-prilocaine (EMLA) cream Apply 1 application topically as needed. 05/13/20   Cammie Sickle, MD  lovastatin (MEVACOR) 40 MG tablet Take 40 mg by mouth at bedtime.  10/03/17   [provider]  metFORMIN (GLUCOPHAGE) 500 MG tablet Take 1,000 mg by mouth 2 (two) times daily. 03/02/19   [provider]  Multiple Vitamin (MULTIVITAMIN WITH MINERALS) TABS tablet Take 1 tablet by mouth daily.    [provider]  Omega-3 Fatty Acids (FISH OIL) 500 MG CAPS Take 500 mg by mouth 2 (two) times daily.     [provider]  ondansetron (ZOFRAN) 8 MG tablet One pill every 8 hours as needed for nausea/vomitting. 07/12/20   Wouk, Ailene Rud, MD  oxyCODONE (OXY IR/ROXICODONE) 5 MG immediate release tablet Take 1 tablet (5 mg total) by mouth every 6 (six) hours as needed for severe pain. 07/23/20   Borders, Kirt Boys, NP  polyethylene glycol (MIRALAX / GLYCOLAX) 17 g packet Take 17 g by mouth daily. 07/13/20   Wouk, Ailene Rud, MD  tamsulosin (FLOMAX) 0.4 MG CAPS capsule Take 1 capsule (0.4 mg total) by mouth daily. 12/08/19   Zara Council A, PA-C  traZODone (DESYREL) 50 MG tablet Take 1 tablet (50 mg total) by mouth at bedtime as needed for sleep. 07/15/20   Cammie Sickle, MD     Family History  Problem Relation Age of Onset  . Colon cancer Father   . Cancer Mother   . Liver cancer Sister   . Kidney cancer Brother   . Bone cancer Brother   . Kidney disease Neg Hx   . Prostate cancer Neg Hx   . Bladder Cancer Neg Hx     Social History   Socioeconomic History  . Marital status: Married    Spouse name: Not on file  . Number of children: Not on file  . Years of education: Not on file  . Highest education level: Not on file  Occupational History  . Not on file  Tobacco Use  . Smoking status: Former Smoker    Quit date: 08/15/1988    Years since quitting: 31.9  . Smokeless tobacco: Never Used  . Tobacco comment: quit september 11th 1991  Vaping Use  . Vaping Use: Never used  Substance and Sexual Activity  . Alcohol use: Yes    Alcohol/week: 12.0 standard drinks    Types: 12 Cans of beer per week  . Drug use: No  . Sexual activity: Not on file  Other Topics Concern  . Not on file  Social History Narrative   Quit smoking in 1991; used to smoke 3  ppd. Lives in [place]; with wife; and 2 daughters. 2-3 drink 4/ week. Worked in Tourist information centre manager; retired in 2013; raised cattle.    Social Determinants of Health   Financial Resource Strain: Not on file  Food Insecurity: Not on file  Transportation Needs: Not on file  Physical Activity: Not on file  Stress: Not on file  Social Connections: Not on file    Review of Systems: A 12 point ROS discussed and pertinent positives are indicated in the HPI above.  All other systems are negative.  Review of Systems  Constitutional: Positive for fatigue. Negative for appetite change.  Respiratory: Positive for shortness of breath. Negative for cough.   Cardiovascular: Negative for chest pain and leg swelling.  Gastrointestinal: Positive for nausea.  Negative for abdominal pain, diarrhea and vomiting.  Neurological: Negative for dizziness and headaches.    Vital Signs: BP 115/83   Pulse (!) 109   Temp 98.4 F (36.9 C) (Oral)   Resp (!) 22   Ht 5\' 8"  (1.727 m)   Wt 226 lb (102.5 kg)   SpO2 90%   BMI 34.36 kg/m   Physical Exam Constitutional:      General: He is not in acute distress.    Appearance: He is ill-appearing.  HENT:     Mouth/Throat:     Mouth: Mucous membranes are dry.     Pharynx: Oropharynx is clear.     Comments: Tongue laceration/ecchymosis - patient bit tongue on accident yesterday  Cardiovascular:     Rate and Rhythm: Regular rhythm. Tachycardia present.     Pulses: Normal pulses.     Heart sounds: Normal heart sounds.     Comments: Right upper chest port - not currently accessed.  Pulmonary:     Breath sounds: Decreased air movement present.     Comments: Right side with fine crackles and decreased breath sounds. Left side with decreased breath sounds.  Abdominal:     General: Bowel sounds are normal.     Palpations: Abdomen is soft.     Tenderness: There is no abdominal tenderness.  Skin:    General: Skin is warm and dry.  Neurological:     Mental Status:  He is alert and oriented to person, place, and time.     Imaging: DG Chest 1 View  Result Date: 07/18/2020 CLINICAL DATA:  Status post right thoracentesis. EXAM: CHEST  1 VIEW COMPARISON:  07/11/2020 FINDINGS: Stable right jugular porta catheter with its tip in the right atrium. Clear lungs. Stable small amount of right lateral pleural thickening or fluid. No pneumothorax. Thoracic spine degenerative changes. IMPRESSION: No pneumothorax following thoracentesis. Electronically Signed   By: Claudie Revering M.D.   On: 07/18/2020 15:06   CT HEAD WO CONTRAST  Result Date: 07/09/2020 CLINICAL DATA:  Initial evaluation for acute mental status change, unknown cause. EXAM: CT HEAD WITHOUT CONTRAST TECHNIQUE: Contiguous axial images were obtained from the base of the skull through the vertex without intravenous contrast. COMPARISON:  Previous MRI from 05/09/2020. FINDINGS: Brain: Moderately advanced cerebral atrophy. Associated mild chronic microvascular ischemic disease. No acute intracranial hemorrhage. No acute large vessel territory infarct. No intraparenchymal mass lesion, mass effect, or midline shift. No hydrocephalus or extra-axial fluid collection. Vascular: No hyperdense vessel. Scattered vascular calcifications noted within the carotid siphons. Skull: Scalp soft tissues within normal limits. Calvarium intact. Small probable metastatic lesion involving the clivus is grossly similar to previous MRI. No other visible discrete osseous lesions. Sinuses/Orbits: Globes and orbital soft tissues demonstrate no acute finding. Paranasal sinuses are largely clear. No mastoid effusion. Other: None. IMPRESSION: 1. No acute intracranial abnormality. 2. Moderately advanced cerebral atrophy with mild chronic small vessel ischemic disease. 3. Small probable metastatic lesion involving the clivus, grossly similar to previous MRI. Electronically Signed   By: Jeannine Boga M.D.   On: 07/09/2020 22:33   CT Chest W  Contrast  Result Date: 07/06/2020 CLINICAL DATA:  Follow-up metastatic right lung non-small cell carcinoma. Currently undergoing chemotherapy. EXAM: CT CHEST WITH CONTRAST TECHNIQUE: Multidetector CT imaging of the chest was performed during intravenous contrast administration. CONTRAST:  25mL OMNIPAQUE IOHEXOL 300 MG/ML  SOLN COMPARISON:  PET-CT on 05/15/2019 FINDINGS: Cardiovascular: No acute findings. Aortic and coronary atherosclerotic calcification noted. Mediastinum/Nodes: Mediastinal lymphadenopathy in right  paratracheal region remains stable measuring 2.2 cm short axis. Right hilar lymphadenopathy also shows no significant change measuring 2.5 cm. No new or increased sites of lymphadenopathy identified. Lungs/Pleura: Irregular masslike opacity in the central right upper lobe shows no significant change, measuring 4.4 x 2.7 cm on image 59/2. An irregular pulmonary nodule in the anterior right upper lobe shows mild decrease in size, currently measuring 2.0 x 1.8 cm on image 43/3 compared to 2.3 x 2.2 cm previously. Several sub-cm adjacent sub pleural nodules in the anterior right middle lobe remains stable. Focal opacity in the anterior right upper lobe is unchanged and consistent with postobstructive atelectasis or pneumonitis. Moderate to large right pleural effusion shows mild increase in size since previous study. Diffuse pleural based soft tissue nodularity shows no significant change and indicates a malignant pleural effusion. Upper Abdomen:  Unremarkable. Musculoskeletal:  No suspicious bone lesions. IMPRESSION: Slight decrease in size of anterior right upper lobe pulmonary nodule. Stable masslike opacity in central right upper lobe, and stable right hilar and right paratracheal lymphadenopathy. Stable opacity in medial right upper lobe, likely due to postobstructive atelectasis or pneumonitis. Mild increase in size of malignant right pleural effusion. Aortic Atherosclerosis (ICD10-I70.0).  Electronically Signed   By: Marlaine Hind M.D.   On: 07/06/2020 16:51   MR THORACIC SPINE W WO CONTRAST  Result Date: 07/10/2020 CLINICAL DATA:  Metastatic non-small cell lung cancer. Back pain. History of back surgery as a teenager. EXAM: MRI THORACIC WITHOUT AND WITH CONTRAST TECHNIQUE: Multiplanar and multiecho pulse sequences of the thoracic spine were obtained without and with intravenous contrast. CONTRAST:  81mL GADAVIST GADOBUTROL 1 MMOL/ML IV SOLN COMPARISON:  CT chest 07/05/2020 FINDINGS: Alignment:  Normal Vertebrae: Chronic compression fracture T12 with posterior wire fixation. Widespread metastatic disease throughout the bone marrow of the thoracic spine with all thoracic levels involved. Diffuse rib involvement. No acute fracture. No epidural tumor. Cord:  Negative for cord compression.  Cord signal normal. Paraspinal and other soft tissues: Large right pleural effusion Disc levels: Negative for thoracic spinal stenosis. Small central disc protrusion at T7-8. 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 No pathologic fracture. No cord compression. Large right pleural effusion Chronic fracture T12 with posterior wire fixation. Electronically Signed   By: Franchot Gallo M.D.   On: 07/10/2020 14:41   MR Lumbar Spine W Wo Contrast  Result Date: 07/10/2020 CLINICAL DATA:  Non-small-cell lung cancer. Back pain. History of back surgery as a teenager. EXAM: MRI LUMBAR SPINE WITHOUT AND WITH CONTRAST TECHNIQUE: Multiplanar and multiecho pulse sequences of the lumbar spine were obtained without and with intravenous contrast. CONTRAST:  53mL GADAVIST GADOBUTROL 1 MMOL/ML IV SOLN COMPARISON:  Lumbar MRI 11/01/2009 FINDINGS: Segmentation:  Normal Alignment:  Mild retrolisthesis L2-3 Vertebrae: Widespread skeletal metastatic disease. Numerous small lesions are seen throughout the bone marrow of the lumbar spine, sacrum, and iliac  bones. No pathologic fracture. Chronic fracture T12 with posterior wire fixation as noted on thoracic MRI. Conus medullaris and cauda equina: Conus extends to the L1-2 level. Conus and cauda equina appear normal. Paraspinal and other soft tissues: Negative for paraspinous mass or adenopathy. Right pleural effusion Disc levels: L1-2: Negative L2-3: Mild retrolisthesis. Diffuse disc bulging and spurring without stenosis. Interval resolution of right foraminal disc protrusion seen on the prior MRI L3-4: Disc degeneration with diffuse disc bulging and spurring. Bilateral facet degeneration. Mild subarticular stenosis bilaterally L4-5: Disc degeneration with diffuse disc bulging and bilateral facet  hypertrophy. Mild to moderate spinal stenosis. Moderate subarticular stenosis and foraminal stenosis on the right. L5-S1: Disc bulging and spurring. Left foraminal disc and osteophyte and left foraminal encroachment unchanged from the prior MRI. 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 No pathologic fracture. Lumbar degenerative changes. Electronically Signed   By: Franchot Gallo M.D.   On: 07/10/2020 14:46   DG Chest Port 1 View  Result Date: 07/11/2020 CLINICAL DATA:  Pleural effusion, status post right thoracentesis. EXAM: PORTABLE CHEST 1 VIEW COMPARISON:  07/09/2020 FINDINGS: Power injectable right Port-A-Cath tip: Right atrium. Reduced right pleural effusion with resolution of the blunting of the right lateral costophrenic angle. No visible pneumothorax. Atherosclerotic calcification of the aortic arch. Fatty bandlike opacity at the right lung base favoring mild residual atelectasis. IMPRESSION: 1. Substantially reduced right pleural effusion, status post right thoracentesis. No visible pneumothorax. Mild atelectasis at the right lung base. 2.  Aortic Atherosclerosis (ICD10-I70.0). Electronically Signed   By: Van Clines M.D.   On: 07/11/2020 11:05   DG  Chest Portable 1 View  Result Date: 07/09/2020 CLINICAL DATA:  Chemotherapy for lung cancer. EXAM: PORTABLE CHEST 1 VIEW COMPARISON:  05/06/2020 FINDINGS: Power port on the right has its tip at the SVC RA junction. The left chest is clear. There is a sub pulmonic effusion on the right and a small amount of fluid in the fissure. Volume loss in the right lower lung. 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 IMPRESSION: 1. Subpulmonic effusion on the right with some atelectasis of the right lower lung. 2. 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 Electronically Signed   By: Nelson Chimes M.D.   On: 07/09/2020 11:31   DG HIP UNILAT WITH PELVIS 2-3 VIEWS RIGHT  Result Date: 07/03/2020 CLINICAL DATA:  Right hip and upper leg pain since 06/28/2020. History of lung cancer. No known injury. EXAM: DG HIP (WITH OR WITHOUT PELVIS) 2-3V RIGHT COMPARISON:  None. FINDINGS: There is no evidence of hip fracture or dislocation. Mild degenerative change about the hips appear symmetric from right to left. Lower lumbar spondylosis is incompletely evaluated. Soft tissues are negative. IMPRESSION: No acute abnormality. Mild appearing bilateral hip osteoarthritis. Lower lumbar degenerative change also noted. Electronically Signed   By: Inge Rise M.D.   On: 07/03/2020 16:41   DG FEMUR, MIN 2 VIEWS RIGHT  Result Date: 07/03/2020 CLINICAL DATA:  Right hip and upper leg pain since 06/28/2020. History of lung cancer. No known injury. EXAM: RIGHT FEMUR 2 VIEWS COMPARISON:  None. FINDINGS: There is no evidence of fracture or other focal bone lesions. Soft tissues are unremarkable. IMPRESSION: Negative exam. Electronically Signed   By: Inge Rise M.D.   On: 07/03/2020 16:43   US THORACENTESIS ASP PLEURAL SPACE W/IMG GUIDE  Result Date: 07/18/2020 INDICATION: Recurrent malignant right-sided pleural effusion. Request therapeutic thoracentesis. EXAM: ULTRASOUND GUIDED RIGHT THORACENTESIS MEDICATIONS: 1%  plain lidocaine, 5 mL COMPLICATIONS: None immediate. PROCEDURE: An ultrasound guided thoracentesis was thoroughly discussed with the patient and questions answered. The benefits, risks, alternatives and complications were also discussed. The patient understands and wishes to proceed with the procedure. Written consent was obtained. Ultrasound was performed to localize and mark an adequate pocket of fluid in the right chest. The area was then prepped and draped in the normal sterile fashion. 1% Lidocaine was used for local anesthesia. Under ultrasound guidance a 6 Fr Safe-T-Centesis catheter was introduced. Thoracentesis was performed. The catheter was removed and a dressing applied. FINDINGS: A total  of approximately 1.5 L of bloody pleural fluid was removed. IMPRESSION: Successful ultrasound guided right thoracentesis yielding 1.5 L of pleural fluid. Read by: Ascencion Dike PA-C Electronically Signed   By: Aletta Edouard M.D.   On: 07/18/2020 15:07   US THORACENTESIS ASP PLEURAL SPACE W/IMG GUIDE  Result Date: 07/11/2020 INDICATION: Patient with a history of non-small cell lung cancer presents today with a right pleural effusion. Interventional radiology asked to perform a therapeutic and diagnostic thoracentesis. EXAM: ULTRASOUND GUIDED THORACENTESIS MEDICATIONS: 1% lidocaine 10 mL COMPLICATIONS: None immediate. PROCEDURE: An ultrasound guided thoracentesis was thoroughly discussed with the patient and questions answered. The benefits, risks, alternatives and complications were also discussed. The patient understands and wishes to proceed with the procedure. Written consent was obtained. Ultrasound was performed to localize and mark an adequate pocket of fluid in the right chest. The area was then prepped and draped in the normal sterile fashion. 1% Lidocaine was used for local anesthesia. Under ultrasound guidance a 6 Fr Safe-T-Centesis catheter was introduced. Thoracentesis was performed. The catheter was  removed and a dressing applied. FINDINGS: A total of approximately 2.5 L of amber-colored/blood-tinged fluid fluid was removed. Samples were sent to the laboratory as requested by the clinical team. IMPRESSION: Successful ultrasound guided right thoracentesis yielding 2.5 L of pleural fluid. Read by: Soyla Dryer, NP Electronically Signed   By: Sandi Mariscal M.D.   On: 07/11/2020 11:08    Labs:  CBC: Recent Labs    07/09/20 0821 07/10/20 0105 07/16/20 1312 07/23/20 1008  WBC 7.3 7.9 9.8 7.4  HGB 12.5* 11.3* 11.7* 11.6*  HCT 35.8* 32.1* 35.8* 36.5*  PLT 229 191 141* 217    COAGS: No results for input(s): INR, APTT in the last 8760 hours.  BMP: Recent Labs    07/11/20 0521 07/11/20 0839 07/12/20 0620 07/12/20 1147 07/16/20 1312 07/23/20 1008  NA 129*   < > 132* 130* 140 138  K 4.3  --  4.2  --  4.2 4.3  CL 97*  --  99  --  104 104  CO2 25  --  23  --  26 23  GLUCOSE 138*  --  124*  --  160* 122*  BUN 15  --  14  --  17 17  CALCIUM 8.9  --  8.9  --  9.1 9.1  CREATININE 0.63  --  0.63  --  0.73 0.86  GFRNONAA >60  --  >60  --  >60 >60   < > = values in this interval not displayed.    LIVER FUNCTION TESTS: Recent Labs    06/18/20 0849 06/25/20 1441 07/09/20 0821 07/23/20 1008  BILITOT 0.7 0.5 0.8 0.4  AST 28 45* 48* 35  ALT 16 44 18 34  ALKPHOS 174* 160* 249* 211*  PROT 7.3 7.0 7.0 6.9  ALBUMIN 3.5 3.3* 3.4* 2.8*    TUMOR MARKERS: No results for input(s): AFPTM, CEA, CA199, CHROMGRNA in the last 8760 hours.  Assessment and Plan:  Stage IV non-small cell lung cancer with recurrent right-sided pleural effusion: Fred Anderson, 75 year old male, presents today to the [facility] Interventional Radiology department for an image-guided tunneled pleural catheter.   Risks and benefits were discussed with the patient and his family (wife and daughter) including bleeding, infection, damage to adjacent structures and sepsis.  All of the  patient's questions were answered, patient is agreeable to proceed. The patient has been NPO. Vitals signs and recent lab values have  been reviewed.   Consent signed and in chart.  Thank you for this interesting consult.  I greatly enjoyed meeting Fred Anderson and look forward to participating in their care.  A copy of this report was sent to the requesting provider on this date.  Electronically Signed: Soyla Dryer, AGACNP-BC [phone] 07/26/2020, 9:12 AM   I spent a total of  30 Minutes   in face to face in clinical consultation, greater than 50% of which was counseling/coordinating care for tunneled pleural catheter placement

## 2020-07-26 ENCOUNTER — Ambulatory Visit
Admission: RE | Admit: 2020-07-26 | Discharge: 2020-07-26 | Disposition: A | Discharge status: Home / Self Care | Payer: Medicare Other | Source: Ambulatory Visit | Attending: Internal Medicine | Admitting: Internal Medicine

## 2020-07-26 ENCOUNTER — Other Ambulatory Visit: Payer: Self-pay

## 2020-07-26 DIAGNOSIS — J90 Pleural effusion, not elsewhere classified: Secondary | ICD-10-CM | POA: Diagnosis not present

## 2020-07-26 DIAGNOSIS — Z7984 Long term (current) use of oral hypoglycemic drugs: Secondary | ICD-10-CM | POA: Diagnosis not present

## 2020-07-26 DIAGNOSIS — C3490 Malignant neoplasm of unspecified part of unspecified bronchus or lung: Secondary | ICD-10-CM | POA: Diagnosis not present

## 2020-07-26 DIAGNOSIS — C3411 Malignant neoplasm of upper lobe, right bronchus or lung: Secondary | ICD-10-CM

## 2020-07-26 DIAGNOSIS — Z79899 Other long term (current) drug therapy: Secondary | ICD-10-CM | POA: Insufficient documentation

## 2020-07-26 DIAGNOSIS — E119 Type 2 diabetes mellitus without complications: Secondary | ICD-10-CM | POA: Diagnosis not present

## 2020-07-26 DIAGNOSIS — I10 Essential (primary) hypertension: Secondary | ICD-10-CM | POA: Diagnosis not present

## 2020-07-26 DIAGNOSIS — Z7982 Long term (current) use of aspirin: Secondary | ICD-10-CM | POA: Insufficient documentation

## 2020-07-26 DIAGNOSIS — Z87891 Personal history of nicotine dependence: Secondary | ICD-10-CM | POA: Insufficient documentation

## 2020-07-26 HISTORY — PX: IR GUIDED DRAIN W CATHETER PLACEMENT: IMG719

## 2020-07-26 MED ORDER — FENTANYL CITRATE (PF) 100 MCG/2ML IJ SOLN
INTRAMUSCULAR | Status: AC
  Filled 2020-07-26: qty 2

## 2020-07-26 MED ORDER — MIDAZOLAM HCL 2 MG/2ML IJ SOLN
INTRAMUSCULAR | Status: AC
  Filled 2020-07-26: qty 2

## 2020-07-26 MED ORDER — SODIUM CHLORIDE 0.9 % IV SOLN: INTRAVENOUS | Status: DC

## 2020-07-26 MED ORDER — CEFAZOLIN SODIUM-DEXTROSE 2-4 GM/100ML-% IV SOLN: 2.0000 g | INTRAVENOUS | Status: DC

## 2020-07-26 MED ORDER — FENTANYL CITRATE (PF) 100 MCG/2ML IJ SOLN
INTRAMUSCULAR | Status: AC | PRN
  Administered 2020-07-26 (×2): 50 ug via INTRAVENOUS

## 2020-07-26 MED ORDER — ONDANSETRON HCL 4 MG/2ML IJ SOLN
INTRAMUSCULAR | Status: AC
  Filled 2020-07-26: qty 2

## 2020-07-26 MED ORDER — MIDAZOLAM HCL 2 MG/2ML IJ SOLN
INTRAMUSCULAR | Status: AC | PRN
  Administered 2020-07-26 (×2): 1 mg via INTRAVENOUS

## 2020-07-26 MED ORDER — ONDANSETRON HCL 4 MG/2ML IJ SOLN
INTRAMUSCULAR | Status: AC | PRN
  Administered 2020-07-26: 4 mg via INTRAVENOUS

## 2020-07-26 NOTE — Procedures (Signed)
Interventional Radiology Procedure Note  Procedure: Right tunneled pleural catheter placement  Findings: Please refer to procedural dictation for full description. 1.5 L translucent, blood tinged fluid removed.  Complications: None immediate  Estimated Blood Loss: < 5 mL  Recommendations: Local drain/wound care. Follow up with IR as needed.   Ruthann Cancer, MD Pager: [phone]

## 2020-07-26 NOTE — Progress Notes (Signed)
Patient clinically stable post Pleural Pleurx catheter placement per DR Suttle,tolerated well. Right sided drain placed with 1.5 liters removed pink/blood tinged, after which dressing applied, received Versed 2 mg along with Fentanyl 100 mcg IV for procedure. Report given to Lowanda Foster RN post procedure/recovery.

## 2020-07-31 ENCOUNTER — Other Ambulatory Visit: Payer: Self-pay | Admitting: Hospice and Palliative Medicine

## 2020-07-31 MED ORDER — OXYCODONE HCL 5 MG PO TABS: 5.0000 mg | ORAL_TABLET | ORAL | 0 refills | Status: DC | PRN

## 2020-07-31 MED ORDER — MORPHINE SULFATE ER 15 MG PO TBCR: 15.0000 mg | EXTENDED_RELEASE_TABLET | Freq: Two times a day (BID) | ORAL | 0 refills | Status: DC

## 2020-07-31 NOTE — Progress Notes (Signed)
I spoke with patient's hospice nurse. She reports that patient is having persistent and generalized pain. Oxycodone is helpful but extremely short-lived (1-2 hours). Will increase dose/frequency of oxycodone and plan to start patient on MS Contin for long acting control.

## 2020-08-02 ENCOUNTER — Other Ambulatory Visit: Payer: Self-pay

## 2020-08-02 ENCOUNTER — Ambulatory Visit (INDEPENDENT_AMBULATORY_CARE_PROVIDER_SITE_OTHER): Payer: Medicare Other | Admitting: Physician Assistant

## 2020-08-02 ENCOUNTER — Encounter: Payer: Self-pay | Admitting: Physician Assistant

## 2020-08-02 VITALS — BP 100/60 | HR 127 | Temp 99.3°F | Ht 71.0 in | Wt 210.0 lb

## 2020-08-02 DIAGNOSIS — R339 Retention of urine, unspecified: Secondary | ICD-10-CM

## 2020-08-02 DIAGNOSIS — R310 Gross hematuria: Secondary | ICD-10-CM | POA: Diagnosis not present

## 2020-08-02 LAB — URINALYSIS, COMPLETE
Bilirubin, UA: NEGATIVE
Nitrite, UA: POSITIVE — AB
Specific Gravity, UA: 1.02 (ref 1.005–1.030)
Urobilinogen, Ur: 2 mg/dL — ABNORMAL HIGH (ref 0.2–1.0)
pH, UA: 5 (ref 5.0–7.5)

## 2020-08-02 LAB — MICROSCOPIC EXAMINATION: RBC, Urine: >30 /hpf — AB (ref 0–2)

## 2020-08-02 LAB — BLADDER SCAN AMB NON-IMAGING: Scan Result: 184

## 2020-08-02 MED ORDER — SULFAMETHOXAZOLE-TRIMETHOPRIM 800-160 MG PO TABS: 1.0000 | ORAL_TABLET | Freq: Two times a day (BID) | ORAL | 0 refills | Status: AC

## 2020-08-02 MED ORDER — CEFTRIAXONE SODIUM 1 G IJ SOLR
1.0000 g | Freq: Once | INTRAMUSCULAR | Status: AC
  Administered 2020-08-02: 1 g via INTRAMUSCULAR

## 2020-08-02 MED ORDER — CEFTRIAXONE SODIUM 1 G IJ SOLR: 1.0000 g | Freq: Once | INTRAMUSCULAR | Status: DC

## 2020-08-02 NOTE — Progress Notes (Signed)
08/02/2020 3:02 PM   Fred Anderson 02-26-1946 [id number]  CC: Chief Complaint  Patient presents with  . Hematuria    HPI: Fred Anderson is a 75 y.o. 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  He is accompanied to clinic today by his wife and daughter, who contribute to HPI.  Today reports a 4-day history of intermittent gross hematuria with passage of clots.  They have had a hard time with intermittent catheterization due to clot occlusion of the catheters.  He denies lower abdominal pain today.  He reports some right flank pain today since undergoing thoracentesis last week.  He states this pain is typical for him following this procedure and does not represent an acute change.  He had an incidental right renal stone on PET scan dated 05/15/2020, however on further review this appears to be more consistent with a parenchymal calcification.  In-office UA today positive for trace glucose, 1+ ketones, 3+ blood, 2+ protein, 2.0 EU/DL urobilinogen, nitrites, and trace leukocyte esterase; urine microscopy with 6-10 WBCs/HPF, >30 RBCs/HPF, and many bacteria.  Bladder scan in triage with 184 mL.  PMH: Past Medical History:  Diagnosis Date  . Arthritis   . Blood in semen   . BPH (benign prostatic hypertrophy)   . Cancer ([place])    melanoma on back  . DDD (degenerative disc disease), lumbar   . Diabetes mellitus without complication ([place])    TYPE 2  . Erectile dysfunction   . Hematospermia   . Hemorrhoids   . History of adenomatous polyp of colon   . History of kidney stones   . Hyperlipidemia   . Hypertension   . Kidney stones   . Obesity   . 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[place])   . Prediabetes     Surgical History: Past Surgical History:  Procedure Laterality Date  . BACK SURGERY  age 36   Broken back   . COLONOSCOPY WITH PROPOFOL N/A 05/05/2017   Procedure: COLONOSCOPY WITH PROPOFOL;  Surgeon: Toledo, Benay Pike, MD;  Location: [facility] ENDOSCOPY;  Service: Gastroenterology;  Laterality: N/A;  . HEMORRHOID SURGERY    . IR GUIDED DRAIN W CATHETER PLACEMENT  07/26/2020  . IR IMAGING GUIDED PORT INSERTION  05/17/2020  . LAPAROSCOPIC APPENDECTOMY N/A 04/25/2018   Procedure: APPENDECTOMY LAPAROSCOPIC;  Surgeon: Herbert Pun, MD;  Location: [facility] ORS;  Service: General;  Laterality: N/A;  . SHOULDER ARTHROSCOPY WITH OPEN ROTATOR CUFF REPAIR Right 04/03/2019   Procedure: SHOULDER ARTHROSCOPY WITH SUBSACAPULARIS REPAIR, OPEN ROTATOR CUFF REPAIR, SUBARCOMIAL DECOMPRESSION, BICEPS TENODESIS;  Surgeon: Leim Fabry, MD;  Location: [facility] ORS;  Service: Orthopedics;  Laterality: Right;  . SMALL INTESTINE SURGERY      Home Medications:  Allergies as of 08/02/2020      Reactions   Atorvastatin Other (See Comments)   Muscle pain.      Medication List       Accurate as of Aug 02, 2020  3:02 PM. If you have any questions, ask your nurse or doctor.        acetaminophen 500 MG tablet Commonly known as: TYLENOL Take 1,000 mg by mouth every 6 (six) hours as needed.   albuterol 108 (90 Base) MCG/ACT inhaler Commonly known as: VENTOLIN HFA Inhale 2 puffs into the lungs every 6 (six) hours as needed for wheezing or shortness of breath.  aspirin 325 MG tablet Take 325 mg by mouth daily.   budesonide-formoterol 160-4.5 MCG/ACT inhaler Commonly known as: Symbicort Inhale 2 puffs into the lungs in the morning and at bedtime. What changed:   when to take this  reasons to take this   chlorpheniramine-HYDROcodone 10-8 MG/5ML Suer Commonly known as: TUSSIONEX Take 5 mLs by mouth at bedtime as needed for cough.   dexamethasone 4 MG tablet Commonly known as: DECADRON Take one pill AM & PM x 2 days; one day before and one day after chemo. None on the day of chemo.   Fish Oil 500 MG Caps Take 500  mg by mouth 2 (two) times daily.   folic acid 1 MG tablet Commonly known as: FOLVITE Take 1 tablet (1 mg total) by mouth daily.   gabapentin 300 MG capsule Commonly known as: NEURONTIN Take 1 capsule (300 mg total) by mouth 3 (three) times daily.   lidocaine-prilocaine cream Commonly known as: EMLA Apply 1 application topically as needed.   lovastatin 40 MG tablet Commonly known as: MEVACOR Take 40 mg by mouth at bedtime.   metFORMIN 500 MG tablet Commonly known as: GLUCOPHAGE Take 1,000 mg by mouth 2 (two) times daily.   morphine 15 MG 12 hr tablet Commonly known as: MS CONTIN Take 1 tablet (15 mg total) by mouth every 12 (twelve) hours.   multivitamin with minerals Tabs tablet Take 1 tablet by mouth daily.   ondansetron 8 MG tablet Commonly known as: ZOFRAN One pill every 8 hours as needed for nausea/vomitting.   oxyCODONE 5 MG immediate release tablet Commonly known as: Oxy IR/ROXICODONE Take 1-2 tablets (5-10 mg total) by mouth every 4 (four) hours as needed for severe pain.   polyethylene glycol 17 g packet Commonly known as: MIRALAX / GLYCOLAX Take 17 g by mouth daily.   tamsulosin 0.4 MG Caps capsule Commonly known as: FLOMAX Take 1 capsule (0.4 mg total) by mouth daily.   traZODone 50 MG tablet Commonly known as: DESYREL Take 1 tablet (50 mg total) by mouth at bedtime as needed for sleep.       Allergies:  Allergies  Allergen Reactions  . Atorvastatin Other (See Comments)    Muscle pain.    Family History: Family History  Problem Relation Age of Onset  . Colon cancer Father   . Cancer Mother   . Liver cancer Sister   . Kidney cancer Brother   . Bone cancer Brother   . Kidney disease Neg Hx   . Prostate cancer Neg Hx   . Bladder Cancer Neg Hx     Social History:   reports that he quit smoking about 31 years ago. He has never used smokeless tobacco. He reports current alcohol use of about 12.0 standard drinks of alcohol per week. He  reports that he does not use drugs.  Physical Exam: BP 100/60   Pulse (!) 127   Temp 99.3 F (37.4 C) (Oral)   Ht 5\' 11"  (1.803 m)   Wt 210 lb (95.3 kg)   BMI 29.29 kg/m   Constitutional:  Alert and oriented, no acute distress, nontoxic appearing HEENT: [place], AT Cardiovascular: No clubbing, cyanosis, or edema Respiratory: Normal respiratory effort, no increased work of breathing Skin: No rashes, bruises or suspicious lesions Neurologic: Grossly intact, no focal deficits, moving all 4 extremities Psychiatric: Normal mood and affect  Laboratory Data: Results for orders placed or performed in visit on 08/02/20  Microscopic Examination   Urine  Result Value  Ref Range   WBC, UA 6-10 (A) 0 - 5 /hpf   RBC >30 (A) 0 - 2 /hpf   Epithelial Cells (non renal) 0-10 0 - 10 /hpf   Bacteria, UA Many (A) None seen/Few  Urinalysis, Complete  Result Value Ref Range   Specific Gravity, UA 1.020 1.005 - 1.030   pH, UA 5.0 5.0 - 7.5   Color, UA Brown (A) Yellow   Appearance Ur Cloudy (A) Clear   Leukocytes,UA Trace (A) Negative   Protein,UA 2+ (A) Negative/Trace   Glucose, UA Trace (A) Negative   Ketones, UA 1+ (A) Negative   RBC, UA 3+ (A) Negative   Bilirubin, UA Negative Negative   Urobilinogen, Ur 2.0 (H) 0.2 - 1.0 mg/dL   Nitrite, UA Positive (A) Negative   Microscopic Examination See below:   Bladder Scan (Post Void Residual) in office  Result Value Ref Range   Scan Result 184    Assessment & Plan:   1. Gross hematuria Differential includes BPH, infection, and/or stone, though my suspicion for stone is low given no acute changes in his flank pain and stone more consistent with parenchymal calcification on imaging review.  I placed a Foley catheter upon arrival today and irrigated his bladder, see separate procedure note for details.  There was no evidence of active bleeding and in shared decision-making with the patient, we elected to remove the Foley upon completion of the  procedure.  UA grossly infected today, with some tachycardia but afebrile.  We administered 1 dose of IM ceftriaxone in clinic and will treat with empiric Bactrim DS twice daily x7 days.  We discussed return precautions including fever, chills, nausea, vomiting, or pain.  Patient is in agreement with this plan. - Bladder Scan (Post Void Residual) in office - Urinalysis, Complete - CULTURE, URINE COMPREHENSIVE - sulfamethoxazole-trimethoprim (BACTRIM DS) 800-160 MG tablet; Take 1 tablet by mouth 2 (two) times daily for 7 days.  Dispense: 14 tablet; Refill: 0 - cefTRIAXone (ROCEPHIN) injection 1 g  Return in 1 week (on 08/09/2020) for 1 week follow-up with UA and symptom recheck.  Debroah Loop, PA-C  [facility] [address] [place], [place] [address] [phone]

## 2020-08-02 NOTE — Progress Notes (Signed)
Simple Catheter Placement, Bladder Irrigation, and Catheter Removal  Due to gross hematuria with clots patient is present today for a foley cath placement.  The foreskin was retracted, the patient was cleaned and prepped in a sterile fashion with betadine, and 2% lidocaine jelly was instilled into the urethra. A 20 FR foley catheter was inserted, urine return was noted  243ml, urine was amber in color.  The balloon was filled with 10cc of sterile water.    Next, 240 ml of sterile water was instilled and irrigated into the bladder with a 5ml Toomey syringe through the catheter in place.  Efflux cleared from amber to clear and <5ccs of clot material were evacuated from the bladder. At this point, catheter irrigated easily with no evidence of retained clot or active bleeding. Patient tolerated well.   Given no evidence of active bleeding, patient requested Foley be removed.  22ml of water was drained from the balloon. A 20FR foley cath was removed from the bladder no complications were noted . Patient tolerated well.  Performed by: Debroah Loop, PA-C and Gaspar Cola, CMA

## 2020-08-06 ENCOUNTER — Other Ambulatory Visit: Payer: Self-pay

## 2020-08-06 ENCOUNTER — Other Ambulatory Visit: Payer: Self-pay | Admitting: Internal Medicine

## 2020-08-06 ENCOUNTER — Ambulatory Visit: Payer: Medicare Other | Admitting: Physician Assistant

## 2020-08-06 VITALS — BP 101/57 | HR 118 | Temp 97.5°F

## 2020-08-06 DIAGNOSIS — R310 Gross hematuria: Secondary | ICD-10-CM

## 2020-08-06 LAB — CULTURE, URINE COMPREHENSIVE

## 2020-08-06 LAB — BLADDER SCAN AMB NON-IMAGING: Scan Result: 505

## 2020-08-06 NOTE — Progress Notes (Signed)
08/06/2020 1:32 PM   Fred Anderson 1945/08/01 [id number]  CC: Chief Complaint  Patient presents with  . Hematuria   HPI: Fred Anderson is a 75 y.o. 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  He is accompanied to clinic by his wife and daughter, who contribute HPI.  Today, daughter reports she has been unable to pass the catheter successfully into his bladder despite multiple attempts.  Last successful in and out catheterization was last night.  They have noted blood and clots emanating from the penis and apparently clogging the catheters that they have been inserting, noted upon removal.  Patient continues to take empiric Bactrim, which I prescribed him 4 days ago for management of acute cystitis.  Urine culture has resulted preliminarily with gram-negative rods.  Overall, patient reports lower abdominal discomfort and the urge to void, but otherwise states he feels better than he did on Friday.  PVR 505 mL.  PMH: Past Medical History:  Diagnosis Date  . Arthritis   . Blood in semen   . BPH (benign prostatic hypertrophy)   . Cancer ([place])    melanoma on back  . DDD (degenerative disc disease), lumbar   . Diabetes mellitus without complication ([place])    TYPE 2  . Erectile dysfunction   . Hematospermia   . Hemorrhoids   . History of adenomatous polyp of colon   . History of kidney stones   . Hyperlipidemia   . Hypertension   . Kidney stones   . Obesity   . 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[place])   . Prediabetes     Surgical History: Past Surgical History:  Procedure Laterality Date  . BACK SURGERY  age 81   Broken back  . COLONOSCOPY WITH PROPOFOL N/A 05/05/2017   Procedure: COLONOSCOPY WITH PROPOFOL;  Surgeon: Toledo, Benay Pike, MD;  Location: [facility]  ENDOSCOPY;  Service: Gastroenterology;  Laterality: N/A;  . HEMORRHOID SURGERY    . IR GUIDED DRAIN W CATHETER PLACEMENT  07/26/2020  . IR IMAGING GUIDED PORT INSERTION  05/17/2020  . LAPAROSCOPIC APPENDECTOMY N/A 04/25/2018   Procedure: APPENDECTOMY LAPAROSCOPIC;  Surgeon: Herbert Pun, MD;  Location: [facility] ORS;  Service: General;  Laterality: N/A;  . SHOULDER ARTHROSCOPY WITH OPEN ROTATOR CUFF REPAIR Right 04/03/2019   Procedure: SHOULDER ARTHROSCOPY WITH SUBSACAPULARIS REPAIR, OPEN ROTATOR CUFF REPAIR, SUBARCOMIAL DECOMPRESSION, BICEPS TENODESIS;  Surgeon: Leim Fabry, MD;  Location: [facility] ORS;  Service: Orthopedics;  Laterality: Right;  . SMALL INTESTINE SURGERY      Home Medications:  Allergies as of 08/06/2020      Reactions   Atorvastatin Other (See Comments)   Muscle pain.      Medication List       Accurate as of Aug 06, 2020  1:32 PM. If you have any questions, ask your nurse or doctor.        acetaminophen 500 MG tablet Commonly known as: TYLENOL Take 1,000 mg by mouth every 6 (six) hours as needed.   albuterol 108 (90 Base) MCG/ACT inhaler Commonly known as: VENTOLIN HFA Inhale 2 puffs into the lungs every 6 (six) hours as needed for wheezing or shortness of breath.   aspirin 325 MG tablet Take 325 mg by mouth daily.   budesonide-formoterol 160-4.5 MCG/ACT inhaler Commonly known as: Symbicort Inhale 2 puffs  into the lungs in the morning and at bedtime. What changed:   when to take this  reasons to take this   chlorpheniramine-HYDROcodone 10-8 MG/5ML Suer Commonly known as: TUSSIONEX Take 5 mLs by mouth at bedtime as needed for cough.   dexamethasone 4 MG tablet Commonly known as: DECADRON Take one pill AM & PM x 2 days; one day before and one day after chemo. None on the day of chemo.   Fish Oil 500 MG Caps Take 500 mg by mouth 2 (two) times daily.   folic acid 1 MG tablet Commonly known as: FOLVITE Take 1 tablet (1 mg total) by mouth daily.    gabapentin 300 MG capsule Commonly known as: NEURONTIN Take 1 capsule (300 mg total) by mouth 3 (three) times daily.   lidocaine-prilocaine cream Commonly known as: EMLA Apply 1 application topically as needed.   lovastatin 40 MG tablet Commonly known as: MEVACOR Take 40 mg by mouth at bedtime.   metFORMIN 500 MG tablet Commonly known as: GLUCOPHAGE Take 1,000 mg by mouth 2 (two) times daily.   morphine 15 MG 12 hr tablet Commonly known as: MS CONTIN Take 1 tablet (15 mg total) by mouth every 12 (twelve) hours.   multivitamin with minerals Tabs tablet Take 1 tablet by mouth daily.   ondansetron 8 MG tablet Commonly known as: ZOFRAN One pill every 8 hours as needed for nausea/vomitting.   oxyCODONE 5 MG immediate release tablet Commonly known as: Oxy IR/ROXICODONE Take 1-2 tablets (5-10 mg total) by mouth every 4 (four) hours as needed for severe pain.   polyethylene glycol 17 g packet Commonly known as: MIRALAX / GLYCOLAX Take 17 g by mouth daily.   sulfamethoxazole-trimethoprim 800-160 MG tablet Commonly known as: BACTRIM DS Take 1 tablet by mouth 2 (two) times daily for 7 days.   tamsulosin 0.4 MG Caps capsule Commonly known as: FLOMAX Take 1 capsule (0.4 mg total) by mouth daily.   traZODone 50 MG tablet Commonly known as: DESYREL Take 1 tablet (50 mg total) by mouth at bedtime as needed for sleep.       Allergies:  Allergies  Allergen Reactions  . Atorvastatin Other (See Comments)    Muscle pain.    Family History: Family History  Problem Relation Age of Onset  . Colon cancer Father   . Cancer Mother   . Liver cancer Sister   . Kidney cancer Brother   . Bone cancer Brother   . Kidney disease Neg Hx   . Prostate cancer Neg Hx   . Bladder Cancer Neg Hx     Social History:   reports that he quit smoking about 31 years ago. He has never used smokeless tobacco. He reports current alcohol use of about 12.0 standard drinks of alcohol per week. He  reports that he does not use drugs.  Physical Exam: BP (!) 101/57   Pulse (!) 118   Temp (!) 97.5 F (36.4 C) (Oral)   Constitutional:  Alert and oriented, no acute distress, nontoxic appearing HEENT: , AT Cardiovascular: No clubbing, cyanosis, or edema Respiratory: Normal respiratory effort, no increased work of breathing Skin: No rashes, bruises or suspicious lesions Neurologic: Grossly intact, no focal deficits, moving all 4 extremities Psychiatric: Normal mood and affect  Laboratory Data: Results for orders placed or performed in visit on 08/06/20  Bladder Scan (Post Void Residual) in office  Result Value Ref Range   Scan Result 505    Assessment & Plan:  1. Gross hematuria Associated with the inability to perform CIC at home causing symptomatic urinary retention.  See separate procedure note for details.  In short, I placed an 29 French coud Foley catheter in clinic today with significant resistance met at the level of the prostate.  Urine was noted to be yellow without clots and I proceeded with bladder irrigation, which remained yellow without clots.  Based on clinical findings, I suspect the source of the bleeding and difficulty with CIC is a false passage within the prostatic urethra.  I recommended leaving the Foley catheter indwelling for at least 1 week to allow for healing with plans for symptom recheck and catheter planning in 7 days.  Patient and family are in agreement with this plan.  - Bladder Scan (Post Void Residual) in office  Return in about 1 week (around 08/13/2020) for Symptom recheck with catheter planning with [facility].  Debroah Loop, PA-C  [facility] [address] [place], [place] [address] [phone]

## 2020-08-06 NOTE — Progress Notes (Signed)
Simple Catheter Placement and Bladder Irrigation  Due to urinary retention patient is present today for a foley cath placement.  Patient was cleaned and prepped in a sterile fashion with betadine and 2 tubes of 2% lidocaine jelly was instilled into the urethra. A 18 FR coude foley catheter was inserted, urine return was noted  432ml, urine was yellow in color.  The balloon was filled with 10cc of sterile water.   Next, I performed a bladder irrigation due to concerns for possible clot retention. 240 ml of sterile water was instilled and irrigated into the bladder with a 59ml Toomey syringe through the catheter in place.  Urine remained yellow in color, no clot material was evacuated, and the bladder was connected to a night bag and noted to drain completely. Bag was attached to the right leg using a strap and an extra night bag was provided to the patient. Patient tolerated well.   Performed by: Debroah Loop, PA-C and Bradly Bienenstock, CMA

## 2020-08-08 ENCOUNTER — Ambulatory Visit: Payer: Self-pay | Admitting: Urology

## 2020-08-12 NOTE — Progress Notes (Signed)
08/13/2020 11:02 AM   Fred Anderson 1945-11-15 [id number]  Referring provider: Ezequiel Kayser, MD [facility] [place],  [place] [address]  Chief Complaint  Patient presents with  . Urinary Retention   Urological history: 1. BPH with LU TS -managed with tamsulosin 0.4 mg daily   2. ED -contributing factors of age, BPH, DM, HTN, HLD, sleep apnea, alcohol abuse, pain medication and blood pressure medications  3. Urinary retention -occurred during recent hospitalization -Foley place for > 999cc on bladder scan   HPI: Fred Anderson is a 75 y.o. male who presents today for catheter management.  Patient was managing his retention with CIC when he developed gross hematuria.  He likely developed a false passage when cathing himself.    He still has a goal of not passing with catheter in place.  He states his current catheter has been draining clear without blood for the last week.   PMH: Past Medical History:  Diagnosis Date  . Arthritis   . Blood in semen   . BPH (benign prostatic hypertrophy)   . Cancer ([place])    melanoma on back  . DDD (degenerative disc disease), lumbar   . Diabetes mellitus without complication ([place])    TYPE 2  . Erectile dysfunction   . Hematospermia   . Hemorrhoids   . History of adenomatous polyp of colon   . History of kidney stones   . Hyperlipidemia   . Hypertension   . Kidney stones   . Obesity   . 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[place])   . Prediabetes     Surgical History: Past Surgical History:  Procedure Laterality Date  . BACK SURGERY  age 37   Broken back  . COLONOSCOPY WITH PROPOFOL N/A 05/05/2017   Procedure: COLONOSCOPY WITH PROPOFOL;  Surgeon: Toledo, Benay Pike, MD;  Location: [facility] ENDOSCOPY;  Service: Gastroenterology;  Laterality: N/A;  . HEMORRHOID SURGERY    . IR GUIDED DRAIN W CATHETER PLACEMENT  07/26/2020  . IR IMAGING GUIDED PORT INSERTION  05/17/2020  . LAPAROSCOPIC  APPENDECTOMY N/A 04/25/2018   Procedure: APPENDECTOMY LAPAROSCOPIC;  Surgeon: Herbert Pun, MD;  Location: [facility] ORS;  Service: General;  Laterality: N/A;  . SHOULDER ARTHROSCOPY WITH OPEN ROTATOR CUFF REPAIR Right 04/03/2019   Procedure: SHOULDER ARTHROSCOPY WITH SUBSACAPULARIS REPAIR, OPEN ROTATOR CUFF REPAIR, SUBARCOMIAL DECOMPRESSION, BICEPS TENODESIS;  Surgeon: Leim Fabry, MD;  Location: [facility] ORS;  Service: Orthopedics;  Laterality: Right;  . SMALL INTESTINE SURGERY      Home Medications:  Allergies as of 08/13/2020      Reactions   Atorvastatin Other (See Comments)   Muscle pain.      Medication List       Accurate as of Aug 13, 2020 11:02 AM. If you have any questions, ask your nurse or doctor.        acetaminophen 500 MG tablet Commonly known as: TYLENOL Take 1,000 mg by mouth every 6 (six) hours as needed.   albuterol 108 (90 Base) MCG/ACT inhaler Commonly known as: VENTOLIN HFA Inhale 2 puffs into the lungs every 6 (six) hours as needed for wheezing or shortness of breath.   aspirin 325 MG tablet Take 325 mg by mouth daily.   budesonide-formoterol 160-4.5 MCG/ACT inhaler Commonly known as: Symbicort Inhale 2 puffs into the lungs in the morning and at bedtime. What changed:   when to take this  reasons to take this   chlorpheniramine-HYDROcodone 10-8 MG/5ML Suer Commonly known  as: TUSSIONEX Take 5 mLs by mouth at bedtime as needed for cough.   citalopram 20 MG tablet Commonly known as: CELEXA Take 20 mg by mouth daily.   dexamethasone 4 MG tablet Commonly known as: DECADRON Take one pill AM & PM x 2 days; one day before and one day after chemo. None on the day of chemo.   Fish Oil 500 MG Caps Take 500 mg by mouth 2 (two) times daily.   folic acid 1 MG tablet Commonly known as: FOLVITE Take 1 tablet (1 mg total) by mouth daily.   gabapentin 300 MG capsule Commonly known as: NEURONTIN Take 1 capsule (300 mg total) by mouth 3 (three) times  daily.   ipratropium-albuterol 0.5-2.5 (3) MG/3ML Soln Commonly known as: DUONEB Take 3 mLs by nebulization 2 (two) times daily.   lidocaine-prilocaine cream Commonly known as: EMLA Apply 1 application topically as needed.   lovastatin 40 MG tablet Commonly known as: MEVACOR Take 40 mg by mouth at bedtime.   metFORMIN 500 MG tablet Commonly known as: GLUCOPHAGE Take 1,000 mg by mouth 2 (two) times daily.   morphine 15 MG 12 hr tablet Commonly known as: MS CONTIN Take 1 tablet (15 mg total) by mouth every 12 (twelve) hours.   multivitamin with minerals Tabs tablet Take 1 tablet by mouth daily.   ondansetron 8 MG tablet Commonly known as: ZOFRAN One pill every 8 hours as needed for nausea/vomitting.   oxyCODONE 5 MG immediate release tablet Commonly known as: Oxy IR/ROXICODONE Take 1-2 tablets (5-10 mg total) by mouth every 4 (four) hours as needed for severe pain.   polyethylene glycol 17 g packet Commonly known as: MIRALAX / GLYCOLAX Take 17 g by mouth daily.   Senna-Plus 8.6-50 MG tablet Generic drug: senna-docusate Take 1 tablet by mouth 2 (two) times daily as needed.   tamsulosin 0.4 MG Caps capsule Commonly known as: FLOMAX Take 1 capsule (0.4 mg total) by mouth daily.   traZODone 50 MG tablet Commonly known as: DESYREL TAKE 1 TABLET BY MOUTH AT BEDTIME AS NEEDED FOR SLEEP.       Allergies:  Allergies  Allergen Reactions  . Atorvastatin Other (See Comments)    Muscle pain.    Family History: Family History  Problem Relation Age of Onset  . Colon cancer Father   . Cancer Mother   . Liver cancer Sister   . Kidney cancer Brother   . Bone cancer Brother   . Kidney disease Neg Hx   . Prostate cancer Neg Hx   . Bladder Cancer Neg Hx     Social History:  reports that he quit smoking about 32 years ago. He has never used smokeless tobacco. He reports current alcohol use of about 12.0 standard drinks of alcohol per week. He reports that he does not  use drugs.  ROS: For pertinent review of systems please refer to history of present illness  Physical Exam: BP 100/66   Pulse (!) 123   Ht 5\' 11"  (1.803 m)   Wt 190 lb (86.2 kg)   BMI 26.50 kg/m   Constitutional:  Well nourished. Alert and oriented, No acute distress. HEENT: [place] AT, mask in place.  Trachea midline Cardiovascular: No clubbing, cyanosis, or edema. Respiratory: Normal respiratory effort, no increased work of breathing. GU: No CVA tenderness.  No bladder fullness or masses.  Patient with uncircumcised phallus.  Foreskin easily retracted   Urethral meatus is patent.  No penile discharge. No penile lesions or  rashes. Scrotum without lesions, cysts, rashes and/or edema.   Neurologic: Grossly intact, no focal deficits, moving all 4 extremities. Psychiatric: Normal mood and affect.  Laboratory Data: No new labs since last visit  Catheter Removal  Patient is present today for a catheter removal.  9 ml of water was drained from the balloon. An 79 FR coude foley cath was removed from the bladder no complications were noted . Patient tolerated well.  Performed by: Myself   Assessment & Plan:    1. Urinary retention -Foley catheter is removed per patient's request -Discussed that it may be his pain medication that is now allowing his bladder to function properly causing the retention -He will continue tamsulosin twice daily -I gave him an assortment of straight caths should try to see if one would be more comfortable -We discussed only cathing him when he feels uncomfortable and unable to void or if he has not had any urine output for greater than 12 hours  2. BPH with LU TS -Continue tamsulosin 0.4 mg twice daily   Return if symptoms worsen or fail to improve.  Zara Council, PA-C  [facility] [address] [place], [place] [address] [phone]

## 2020-08-13 ENCOUNTER — Encounter: Payer: Self-pay | Admitting: Urology

## 2020-08-13 ENCOUNTER — Ambulatory Visit (INDEPENDENT_AMBULATORY_CARE_PROVIDER_SITE_OTHER): Payer: Medicare Other | Admitting: Urology

## 2020-08-13 ENCOUNTER — Other Ambulatory Visit: Payer: Self-pay | Admitting: Hospice and Palliative Medicine

## 2020-08-13 ENCOUNTER — Other Ambulatory Visit: Payer: Self-pay

## 2020-08-13 VITALS — BP 100/66 | HR 123 | Ht 71.0 in | Wt 190.0 lb

## 2020-08-13 DIAGNOSIS — R339 Retention of urine, unspecified: Secondary | ICD-10-CM | POA: Diagnosis not present

## 2020-08-13 DIAGNOSIS — R310 Gross hematuria: Secondary | ICD-10-CM | POA: Diagnosis not present

## 2020-08-13 MED ORDER — MORPHINE SULFATE ER 15 MG PO TBCR: 15.0000 mg | EXTENDED_RELEASE_TABLET | Freq: Two times a day (BID) | ORAL | 0 refills | Status: DC

## 2020-08-13 NOTE — Progress Notes (Signed)
Hospice requested refill of MS Contin #30

## 2020-08-19 ENCOUNTER — Telehealth: Payer: Self-pay | Admitting: Hospice and Palliative Medicine

## 2020-08-19 DIAGNOSIS — C3411 Malignant neoplasm of upper lobe, right bronchus or lung: Secondary | ICD-10-CM

## 2020-08-19 NOTE — Telephone Encounter (Signed)
Received a call from patient's hospice nurse, Ashby Dawes.  Reportedly, patient has had declining output from Pleurx over the past week with subsequent increase in shortness of breath and right-sided chest pain.  She says that on auscultation, patient has minimal breath sounds on the right side and this is worse since he was last seen.  Will send patient for chest x-ray.

## 2020-08-20 ENCOUNTER — Ambulatory Visit
Admission: RE | Admit: 2020-08-20 | Discharge: 2020-08-20 | Disposition: A | Discharge status: Home / Self Care | Payer: Medicare Other | Source: Ambulatory Visit | Attending: Hospice and Palliative Medicine | Admitting: Hospice and Palliative Medicine

## 2020-08-20 ENCOUNTER — Other Ambulatory Visit: Payer: Self-pay

## 2020-08-20 ENCOUNTER — Ambulatory Visit
Admission: RE | Admit: 2020-08-20 | Discharge: 2020-08-20 | Disposition: A | Discharge status: Home / Self Care | Payer: Medicare Other | Attending: Hospice and Palliative Medicine | Admitting: Hospice and Palliative Medicine

## 2020-08-20 DIAGNOSIS — C3411 Malignant neoplasm of upper lobe, right bronchus or lung: Secondary | ICD-10-CM | POA: Diagnosis present

## 2020-08-21 ENCOUNTER — Telehealth: Payer: Self-pay | Admitting: Hospice and Palliative Medicine

## 2020-08-21 MED ORDER — AMOXICILLIN-POT CLAVULANATE 875-125 MG PO TABS: 1.0000 | ORAL_TABLET | Freq: Two times a day (BID) | ORAL | 0 refills | Status: AC

## 2020-08-21 NOTE — Telephone Encounter (Signed)
Chest x-ray suggestive of right upper lobe/middle lobe pneumonia.  We will start patient on Augmentin 875mg  x 10 days. I spoke with RN who will actively follow and monitor at home.

## 2020-08-28 ENCOUNTER — Other Ambulatory Visit: Payer: Self-pay | Admitting: Hospice and Palliative Medicine

## 2020-08-28 DIAGNOSIS — C3411 Malignant neoplasm of upper lobe, right bronchus or lung: Secondary | ICD-10-CM

## 2020-08-28 DIAGNOSIS — Z515 Encounter for palliative care: Secondary | ICD-10-CM

## 2020-08-28 MED ORDER — MORPHINE SULFATE (CONCENTRATE) 10 MG /0.5 ML PO SOLN: 10.0000 mg | ORAL | 0 refills | Status: DC | PRN

## 2020-08-28 NOTE — Progress Notes (Signed)
I received a call from patient's hospice nurse.  Patient remains short of breath without significant improvement on antibiotics.  He is receiving scheduled nebulizers.  Nurse requested order for morphine elixir.  Will send Rx.

## 2020-09-09 ENCOUNTER — Other Ambulatory Visit: Payer: Self-pay | Admitting: *Deleted

## 2020-09-09 MED ORDER — MORPHINE SULFATE ER 15 MG PO TBCR: 15.0000 mg | EXTENDED_RELEASE_TABLET | Freq: Three times a day (TID) | ORAL | 0 refills | Status: DC

## 2020-09-24 ENCOUNTER — Other Ambulatory Visit: Payer: Self-pay | Admitting: Hospice and Palliative Medicine

## 2020-09-24 MED ORDER — MORPHINE SULFATE (CONCENTRATE) 10 MG /0.5 ML PO SOLN: 10.0000 mg | ORAL | 0 refills | Status: DC | PRN

## 2020-09-24 NOTE — Progress Notes (Signed)
Refill requested by hospice nurse.

## 2020-09-27 ENCOUNTER — Other Ambulatory Visit: Payer: Self-pay | Admitting: *Deleted

## 2020-09-27 MED ORDER — MORPHINE SULFATE ER 15 MG PO TBCR: 15.0000 mg | EXTENDED_RELEASE_TABLET | Freq: Three times a day (TID) | ORAL | 0 refills | Status: DC

## 2020-09-27 NOTE — Telephone Encounter (Signed)
Hospice requests refill for MS Contin tabs, order pended per MD approval.

## 2020-10-02 ENCOUNTER — Other Ambulatory Visit: Payer: Self-pay | Admitting: Hospice and Palliative Medicine

## 2020-10-02 MED ORDER — FENTANYL 25 MCG/HR TD PT72: 1.0000 | MEDICATED_PATCH | TRANSDERMAL | 0 refills | Status: DC

## 2020-10-02 NOTE — Progress Notes (Signed)
I received a call from patient's hospice nurse, Ashby Dawes.  Patient is having difficulty swallowing the MS Contin.  Discussed liberalizing morphine elixir as needed for breakthrough pain.  Will rotate from MS Contin to transdermal fentanyl.

## 2020-10-15 ENCOUNTER — Other Ambulatory Visit: Payer: Self-pay | Admitting: *Deleted

## 2020-10-16 ENCOUNTER — Other Ambulatory Visit: Payer: Self-pay | Admitting: Internal Medicine

## 2020-10-16 ENCOUNTER — Encounter: Payer: Self-pay | Admitting: Internal Medicine

## 2020-10-16 MED ORDER — FENTANYL 25 MCG/HR TD PT72: 1.0000 | MEDICATED_PATCH | TRANSDERMAL | 0 refills | Status: AC

## 2020-10-16 MED ORDER — MORPHINE SULFATE (CONCENTRATE) 10 MG /0.5 ML PO SOLN: 10.0000 mg | ORAL | 0 refills | Status: AC | PRN

## 2020-10-18 ENCOUNTER — Telehealth: Payer: Self-pay | Admitting: *Deleted

## 2020-11-14 NOTE — Telephone Encounter (Signed)
Hospice nurse Fred Anderson called to report that patient expired this morning at 9:44 AM

## 2020-11-14 DEATH — deceased

## 2020-12-09 ENCOUNTER — Other Ambulatory Visit: Payer: Self-pay

## 2020-12-13 ENCOUNTER — Ambulatory Visit: Payer: Self-pay | Admitting: Urology
